# Patient Record
Sex: Female | Born: 1955 | Race: Black or African American | Hispanic: No | Smoking: Current every day smoker
Health system: Southern US, Community
[De-identification: ages and names within clinical notes are randomized; demographics above are authoritative.]

## PROBLEM LIST (undated history)

## (undated) DIAGNOSIS — D45 Polycythemia vera: Secondary | ICD-10-CM

## (undated) DIAGNOSIS — K219 Gastro-esophageal reflux disease without esophagitis: Secondary | ICD-10-CM

## (undated) DIAGNOSIS — Z720 Tobacco use: Secondary | ICD-10-CM

## (undated) DIAGNOSIS — I519 Heart disease, unspecified: Secondary | ICD-10-CM

## (undated) DIAGNOSIS — I509 Heart failure, unspecified: Secondary | ICD-10-CM

## (undated) DIAGNOSIS — J449 Chronic obstructive pulmonary disease, unspecified: Secondary | ICD-10-CM

## (undated) DIAGNOSIS — G4730 Sleep apnea, unspecified: Secondary | ICD-10-CM

## (undated) DIAGNOSIS — E119 Type 2 diabetes mellitus without complications: Secondary | ICD-10-CM

## (undated) DIAGNOSIS — I10 Essential (primary) hypertension: Secondary | ICD-10-CM

## (undated) DIAGNOSIS — M2550 Pain in unspecified joint: Secondary | ICD-10-CM

## (undated) HISTORY — DX: Polycythemia vera: D45

## (undated) HISTORY — DX: Essential (primary) hypertension: I10

## (undated) HISTORY — DX: Heart failure, unspecified: I50.9

## (undated) HISTORY — DX: Heart disease, unspecified: I51.9

## (undated) HISTORY — DX: Pain in unspecified joint: M25.50

---

## 2004-06-10 ENCOUNTER — Ambulatory Visit: Payer: Self-pay | Admitting: Family Medicine

## 2004-06-26 ENCOUNTER — Ambulatory Visit: Payer: Self-pay | Admitting: Family Medicine

## 2004-08-28 ENCOUNTER — Ambulatory Visit: Payer: Self-pay | Admitting: Unknown Physician Specialty

## 2008-02-02 ENCOUNTER — Ambulatory Visit: Payer: Self-pay | Admitting: Internal Medicine

## 2008-02-13 ENCOUNTER — Inpatient Hospital Stay: Payer: Self-pay | Admitting: Surgery

## 2008-06-04 ENCOUNTER — Ambulatory Visit: Payer: Self-pay | Admitting: Family Medicine

## 2009-05-16 ENCOUNTER — Ambulatory Visit: Payer: Self-pay | Admitting: Family Medicine

## 2009-06-05 ENCOUNTER — Ambulatory Visit: Payer: Self-pay | Admitting: Family Medicine

## 2010-06-09 ENCOUNTER — Ambulatory Visit: Payer: Self-pay | Admitting: Family Medicine

## 2011-10-30 ENCOUNTER — Inpatient Hospital Stay: Payer: Self-pay | Admitting: Internal Medicine

## 2011-10-30 LAB — CBC
HCT: 49 % — ABNORMAL HIGH (ref 35.0–47.0)
MCHC: 32.9 g/dL (ref 32.0–36.0)
MCV: 94 fL (ref 80–100)
RBC: 5.22 10*6/uL — ABNORMAL HIGH (ref 3.80–5.20)
RDW: 14.6 % — ABNORMAL HIGH (ref 11.5–14.5)

## 2011-10-30 LAB — URINALYSIS, COMPLETE
Bacteria: NONE SEEN
Bilirubin,UR: NEGATIVE
Glucose,UR: NEGATIVE mg/dL (ref 0–75)
Ketone: NEGATIVE
Specific Gravity: 1.001 (ref 1.003–1.030)
Squamous Epithelial: 1

## 2011-10-30 LAB — BASIC METABOLIC PANEL
Anion Gap: 9 (ref 7–16)
BUN: 7 mg/dL (ref 7–18)
Chloride: 108 mmol/L — ABNORMAL HIGH (ref 98–107)
Co2: 22 mmol/L (ref 21–32)
Creatinine: 0.81 mg/dL (ref 0.60–1.30)
EGFR (African American): >60
Osmolality: 275 (ref 275–301)

## 2011-10-30 LAB — CK TOTAL AND CKMB (NOT AT ARMC)
CK, Total: 122 U/L (ref 21–215)
CK-MB: 1.6 ng/mL (ref 0.5–3.6)

## 2011-10-31 LAB — TROPONIN I
Troponin-I: 0.04 ng/mL
Troponin-I: 0.04 ng/mL

## 2011-10-31 LAB — CBC WITH DIFFERENTIAL/PLATELET
Basophil #: 0.1 10*3/uL (ref 0.0–0.1)
Basophil %: 0.7 %
Eosinophil #: 0.1 10*3/uL (ref 0.0–0.7)
HGB: 15.6 g/dL (ref 12.0–16.0)
Lymphocyte #: 2.4 10*3/uL (ref 1.0–3.6)
Lymphocyte %: 22.4 %
MCH: 32.6 pg (ref 26.0–34.0)
MCV: 95 fL (ref 80–100)
Monocyte #: 0.8 x10 3/mm (ref 0.2–0.9)
Monocyte %: 7 %
Neutrophil #: 7.6 10*3/uL — ABNORMAL HIGH (ref 1.4–6.5)
RBC: 4.79 10*6/uL (ref 3.80–5.20)

## 2011-10-31 LAB — LIPID PANEL
Cholesterol: 141 mg/dL (ref 0–200)
HDL Cholesterol: 38 mg/dL — ABNORMAL LOW (ref 40–60)
Ldl Cholesterol, Calc: 71 mg/dL (ref 0–100)
Triglycerides: 162 mg/dL (ref 0–200)
VLDL Cholesterol, Calc: 32 mg/dL (ref 5–40)

## 2011-10-31 LAB — TSH: Thyroid Stimulating Horm: 0.839 u[IU]/mL

## 2011-11-01 LAB — CBC
HCT: 48.7 % — ABNORMAL HIGH (ref 35.0–47.0)
MCH: 32.1 pg (ref 26.0–34.0)
MCHC: 33.7 g/dL (ref 32.0–36.0)
Platelet: 273 10*3/uL (ref 150–440)
RDW: 15.2 % — ABNORMAL HIGH (ref 11.5–14.5)
WBC: 8.6 10*3/uL (ref 3.6–11.0)

## 2011-11-01 LAB — PROTIME-INR
INR: 1.1
Prothrombin Time: 14.2 secs (ref 11.5–14.7)

## 2011-11-01 LAB — BASIC METABOLIC PANEL
Anion Gap: 12 (ref 7–16)
BUN: 16 mg/dL (ref 7–18)
Chloride: 105 mmol/L (ref 98–107)
Co2: 24 mmol/L (ref 21–32)
Creatinine: 0.98 mg/dL (ref 0.60–1.30)
EGFR (African American): >60
EGFR (Non-African Amer.): >60
Osmolality: 283 (ref 275–301)

## 2011-11-05 LAB — CULTURE, BLOOD (SINGLE)

## 2011-12-01 ENCOUNTER — Ambulatory Visit: Payer: Self-pay

## 2012-04-27 ENCOUNTER — Ambulatory Visit: Payer: Self-pay | Admitting: Cardiovascular Disease

## 2012-06-25 ENCOUNTER — Emergency Department: Payer: Self-pay | Admitting: Emergency Medicine

## 2012-06-25 LAB — COMPREHENSIVE METABOLIC PANEL
Albumin: 3.3 g/dL — ABNORMAL LOW (ref 3.4–5.0)
Alkaline Phosphatase: 122 U/L (ref 50–136)
Anion Gap: 8 (ref 7–16)
BUN: 8 mg/dL (ref 7–18)
Bilirubin,Total: 0.4 mg/dL (ref 0.2–1.0)
Calcium, Total: 8.7 mg/dL (ref 8.5–10.1)
Chloride: 105 mmol/L (ref 98–107)
Co2: 24 mmol/L (ref 21–32)
Potassium: 3.5 mmol/L (ref 3.5–5.1)
SGOT(AST): 27 U/L (ref 15–37)
Sodium: 137 mmol/L (ref 136–145)

## 2012-06-25 LAB — CBC
HCT: 46.7 % (ref 35.0–47.0)
MCH: 32.4 pg (ref 26.0–34.0)
MCV: 93 fL (ref 80–100)
Platelet: 249 10*3/uL (ref 150–440)
RBC: 5.02 10*6/uL (ref 3.80–5.20)
RDW: 14.1 % (ref 11.5–14.5)

## 2012-06-26 LAB — TROPONIN I: Troponin-I: <0.02 ng/mL

## 2012-06-26 LAB — DIGOXIN LEVEL: Digoxin: 0.55 ng/mL

## 2012-06-26 LAB — CK TOTAL AND CKMB (NOT AT ARMC): CK, Total: 281 U/L — ABNORMAL HIGH (ref 21–215)

## 2012-07-06 ENCOUNTER — Ambulatory Visit: Payer: Self-pay | Admitting: Internal Medicine

## 2012-07-12 ENCOUNTER — Other Ambulatory Visit (HOSPITAL_COMMUNITY): Payer: Self-pay | Admitting: *Deleted

## 2012-07-12 DIAGNOSIS — I509 Heart failure, unspecified: Secondary | ICD-10-CM

## 2012-07-13 ENCOUNTER — Other Ambulatory Visit (HOSPITAL_COMMUNITY): Payer: Self-pay | Admitting: *Deleted

## 2012-07-24 ENCOUNTER — Ambulatory Visit (HOSPITAL_COMMUNITY)
Admission: RE | Admit: 2012-07-24 | Discharge: 2012-07-24 | Disposition: A | Discharge status: Home / Self Care | Payer: Medicaid Other | Source: Ambulatory Visit | Attending: Cardiovascular Disease | Admitting: Cardiovascular Disease

## 2012-07-24 DIAGNOSIS — I509 Heart failure, unspecified: Secondary | ICD-10-CM | POA: Insufficient documentation

## 2012-07-24 DIAGNOSIS — Z8249 Family history of ischemic heart disease and other diseases of the circulatory system: Secondary | ICD-10-CM | POA: Insufficient documentation

## 2012-07-24 NOTE — Progress Notes (Signed)
2D Echo Performed 07/24/2012    Sukhraj Esquivias, RCS  

## 2012-08-16 ENCOUNTER — Ambulatory Visit: Payer: Self-pay

## 2013-06-27 ENCOUNTER — Ambulatory Visit: Payer: Self-pay | Admitting: Internal Medicine

## 2013-07-16 ENCOUNTER — Ambulatory Visit: Payer: Self-pay

## 2013-11-27 LAB — TSH: TSH: 1.04 u[IU]/mL (ref 0.41–5.90)

## 2013-12-12 LAB — LIPID PANEL
Cholesterol: 198 mg/dL (ref 0–200)
HDL: 43 mg/dL (ref 35–70)
LDL CALC: 130 mg/dL
Triglycerides: 125 mg/dL (ref 40–160)

## 2014-05-21 NOTE — Discharge Summary (Signed)
PATIENT NAME:  Stacy Cox, Stacy Cox MR#:  [id number] DATE OF BIRTH:  1955/12/01  DATE OF ADMISSION:  10/30/2011 DATE OF DISCHARGE:  11/01/2011  DISCHARGE DIAGNOSES:  1. Acute on chronic systolic heart failure with cardiac catheterization showing moderate coronary artery disease in mid LAD and mid RCA with severe LV systolic dysfunction and ejection fraction of 18% on Echo and 28% on catheterization. 2. Acute bronchitis, ruled out pneumonia, improving on antibiotics.   SECONDARY DIAGNOSIS: History of diverticulosis.   CONSULTANTS: Neoma Laming, MD - Cardiology.  PROCEDURES/RADIOLOGY: Cardiac catheterization on 11/01/2011 showed severely depressed global LV systolic function. Moderate coronary artery disease in mid LAD and RCA with severe LV dysfunction with EF of 28% on catheterization and 18% on Echo. Aggressive medical management was recommended.   2-D echocardiogram on 10/31/2011 showed dilated left atrium and left ventricle with severe LV dysfunction with ejection fraction of 18%. Mild mitral and tricuspid regurgitation with mild pulmonary hypertension.   Chest x-ray on 10/30/2011 showed bilateral interstitial edema versus pneumonitis.   Urinalysis on admission was negative.   Blood cultures x2 were negative, on 10/30/2011.  HISTORY AND SHORT HOSPITAL COURSE: The patient is a 59 year old female with above-mentioned medical problems who was admitted for acute on chronic heart failure. There was also concern for possible pneumonia, which was ruled out, as the patient did not have anymore fever or leukocytosis. This was thought to be possible bronchitis. She had a negative blood culture. The patient's chest pain was resolved. Cardiology consultation was obtained with Dr. Neoma Laming who recommended 2-D echocardiogram and subsequently cardiac catheterization considering the patient's new onset of symptoms, which was performed. The patient's coronaries were clean enough not to get any intervention,  although the patient had severe LV dysfunction for which aggressive medical management was recommended by cardiology. The patient was stable enough to be discharged home on 11/01/2011.  DISCHARGE PHYSICAL EXAMINATION:  VITALS: On the date of discharge, temperature was 97.7, heart rate 62 per minute, respirations 20 per minute, blood pressure 133/63 mmHg, and she was saturating 98% on room air.   CARDIOVASCULAR: S1 and S2 normal. No murmur, rubs, or gallops.   PULMONARY: Lungs clear to auscultation bilaterally. No wheezing, rales, rhonchi, or crepitation.   ABDOMEN: Soft and benign.   NEUROLOGIC: Nonfocal examination. All other physical examination remained at baseline.   DISCHARGE MEDICATIONS:  1. Aspirin 81 mg p.o. daily.  2. Lasix 40 mg p.o. daily.  3. Potassium chloride 20 mEq p.o. daily.  4. Levaquin 500 mg p.o. daily for three days. 5. Spironolactone 25 mg half tablet p.o. daily.  6. Lisinopril 5 mg p.o. daily.  7. Metoprolol 25 mg p.o. at bedtime.  8. Digoxin 0.125 mg p.o. daily.   DISCHARGE DIET: Low sodium, low fat, low cholesterol.   DISCHARGE ACTIVITY: As tolerated.          DISCHARGE INSTRUCTIONS AND FOLLOW-UP: The patient was instructed to follow-up with her primary care physician at the [facility] in 1 to 2 weeks. She will need follow-up with Dr. Neoma Laming this coming Thursday, 11/04/2011 at 2:00 p.m. as scheduled.   TOTAL TIME DISCHARGING THIS PATIENT: 55 minutes.  ____________________________ Lucina Mellow. Manuella Ghazi, MD vss:slb D: 11/04/2011 14:42:07 ET T: 11/04/2011 17:19:25 ET JOB#: [id number]  cc: Xzavier Swinger S. Manuella Ghazi, MD, <Dictator> Bethena Roys. Ancil Boozer, MD Dionisio David, MD Lucina Mellow [facility] MD ELECTRONICALLY SIGNED 11/05/2011 15:28

## 2014-05-21 NOTE — Consult Note (Signed)
PATIENT NAME:  Stacy Cox, Stacy Cox MR#:  [id number] DATE OF BIRTH:  12/18/1955  DATE OF CONSULTATION:  10/31/2011  REFERRING PHYSICIAN:   CONSULTING PHYSICIAN:  Dionisio David, MD  INDICATION FOR CONSULTATION: Congestive heart failure.   HISTORY OF PRESENT ILLNESS: This is a 59 year old African American female with a history of diverticulosis who presented to the Emergency Room with shortness of breath and chest pain. She says last Wednesday and last Thursday she was working. She did not have any chest pain or shortness of breath. On Friday all of a sudden she became very short of breath and started having chest pain. When she came into the Emergency Room, her BNP was 4000. I was asked to evaluate the patient because of CHF. She is still having some shortness of breath, PND, orthopnea but also complaining of pressure in her chest.   PAST MEDICAL HISTORY: History of diverticulosis.   SOCIAL HISTORY: She smokes half pack per day for 30 years. No EtOH abuse or drug use.   FAMILY HISTORY: Positive for coronary artery disease. Her mother had a CABG recently.   PHYSICAL EXAMINATION:   GENERAL: She is alert, oriented x3, in no acute distress right now.  VITAL SIGNS: Pulse 98, respirations 16, afebrile, blood pressure 124/80, saturation 96.   NECK: Positive JVD.   LUNGS: Few crackles at the bases.   HEART: Regular rate and rhythm, tachycardic. There is a 2/6 systolic murmur at the mitral area.   ABDOMEN: Soft, nontender. Positive bowel sounds.   EXTREMITIES: Trace pedal edema.   NEUROLOGIC: She appears to be intact.  LABORATORY, DIAGNOSTIC, AND RADIOLOGICAL DATA: Cholesterol 141, LDL 71. Troponin 0.04. Hemoglobin and hematocrit and white count were normal. So far blood cultures are negative.   Her chest x-ray showed bilateral diffuse interstitial thickening likely representing interstitial edema versus interstitial pneumonitis. Her troponins were negative. BUN and creatinine was normal.    ASSESSMENT AND PLAN: The patient probably has CHF, etiology unclear. Will look at echocardiogram and evaluate etiology of CHF. Right now she continues to have chest pain. Have to further see what is the etiology of the chest pain, most likely is pleuritic but cardiac enzymes are unremarkable. Her EKG showed sinus tachycardia, nonspecific ST-T changes with PVCs. Will start beta-blockers or Coreg once certain the etiology of CHF, whether it's LV dysfunction versus diastolic dysfunction. She is anxious to go home but will have to decide how to proceed based on ejection fraction. Right now will increase her Lasix to 40 twice a day to diurese her further.   Thank you very much for the referral.   ____________________________ Dionisio David, MD sak:drc D: 10/31/2011 10:36:35 ET T: 10/31/2011 11:19:01 ET JOB#: [id number]  cc: Dionisio David, MD, <Dictator> Dionisio David MD ELECTRONICALLY SIGNED 11/08/2011 15:07

## 2014-05-21 NOTE — H&P (Signed)
PATIENT NAME:  Stacy Cox, Stacy Cox MR#:  [id number] DATE OF BIRTH:  1955/11/28  DATE OF ADMISSION:  10/30/2011  REFERRING PHYSICIAN: Pollie Friar, MD   PRIMARY CARE PHYSICIAN: Bethena Roys. Sowles, MD   CHIEF COMPLAINT: Cough, sputum, shortness of breath since yesterday.   HISTORY OF PRESENT ILLNESS: The patient is a 59 year old African American female with a history of diverticulosis, presented to the ED with the above chief complaint. The patient is alert, awake, and oriented. According to her, she started to have a cough, shortness of breath with sputum since yesterday. She also complains of chest pain which is substernal, dull, constant, no radiation. She said she has had orthopnea and nocturnal dyspnea for weeks. In addition, she has foot edema. Also, she has chills, but no fever. She denies any other symptoms. Her BNP was elevated more than 4000 in the Emergency Department.   PAST MEDICAL HISTORY: Diverticulosis.   SOCIAL HISTORY: She smokes 10 cigarettes a day for more than 30 years. No alcohol drinking or illicit drugs.   FAMILY HISTORY: Father had a heart attack. Her mother had coronary artery disease bypass graft, status post CABG. Sister has diabetes.   PAST SURGICAL HISTORY: None.   REVIEW OF SYSTEMS: CONSTITUTIONAL: The patient denies any fevers but had chills. No weakness or weight gain. EYES: No double vision, blurred vision. ENT: No postnasal drip, epistaxis, slurred speech, or dysphagia. CARDIOVASCULAR: Positive for chest pain, but no palpitations. Has orthopnea and nocturnal dyspnea and foot edema.   PULMONARY: Positive for shortness of breath, cough with sputum. No wheezing, no hemoptysis.   GASTROINTESTINAL: No abdominal pain, nausea, vomiting, or diarrhea. No melena or bloody stools.   GENITOURINARY: No dysuria, hematuria, or incontinence.   SKIN: No rash or jaundice.   NEUROLOGY: No syncope, loss of consciousness or seizure.   HEMATOLOGY: No easy bruising, bleeding.    MUSCULOSKELETAL: No joint pain. No edema.   ENDOCRINE: No heat or cold intolerance. No polyuria or polydipsia.   PHYSICAL EXAMINATION:  VITAL SIGNS: Temperature 98.4, blood pressure originally was 185/115 and decreased to 141/89, pulse 92, respirations 22, oxygen saturation 95% on room air.   GENERAL: The patient is alert, awake, oriented, in no acute distress.   HEENT: Pupils are round, equal, reactive to light and accommodation. Moist oral mucosa. Clear oropharynx.   NECK: Supple. No JVD or carotid bruit. No lymphadenopathy. No thyromegaly.   CARDIOVASCULAR: S1, S2 regular rate and rhythm. No murmurs or gallops.   PULMONARY: Bilateral air entry. No wheezing or rales. No use of accessory muscles to breathe.   ABDOMEN: Soft, obese. No distention. No tenderness. No organomegaly. Bowel sounds present.   EXTREMITIES: No edema, clubbing, or cyanosis. No calf tenderness. Strong bilateral pedal pulses.   NEUROLOGY: Alert and oriented x3. No focal deficit. Power 5 out of 5. Sensation intact.   SKIN: No rash or jaundice.   LABORATORY, DIAGNOSTIC AND RADIOLOGICAL DATA: WBC 14.9, hemoglobin 6.1, platelets 299. BNP 4485, CK 122, CK-MB 1.6. Electrolytes are normal. BUN 7, creatinine 0.81. Troponin less than 0.02. Urinalysis is negative. Chest x-ray showed bilateral diffuse interstitial thickening representing interstitial edema or interstitial pneumonitis. EKG showed sinus tachycardia with a PVC, possible left atrial enlargement, heart rate 103 beats per minute.   IMPRESSION:  1. New onset acute congestive heart failure.  2. Chest pain, need to rule out ACS or coronary artery disease.  3. Hypertension.  4. Leukocytosis.  5. Pneumonitis.  6. Tobacco abuse.   PLAN OF TREATMENT:  1. The  patient will be admitted to the telemetry floor. We will continue O2 by nasal cannula, start Lasix 20 mg IV every 12 hours. We will get echocardiograph and a Cardiology consult.  2. We will follow up  troponin level and give aspirin.  3. To cover pneumonia, we will give Levaquin and follow up CBC, sputum culture, blood culture.  4. Smoking cessation was counseled for 5 minutes.   I discussed the patient's situation and the plan of treatment with the patient and the patient's daughter.   TIME SPENT: About 50 minutes.  ____________________________ Demetrios Loll, MD qc:cbb D: 10/30/2011 22:45:51 ET T: 10/31/2011 08:50:41 ET JOB#: [id number]  cc: Demetrios Loll, MD, <Dictator> Bethena Roys. Ancil Boozer, MD Demetrios Loll MD ELECTRONICALLY SIGNED 11/01/2011 14:51

## 2014-06-20 ENCOUNTER — Encounter (INDEPENDENT_AMBULATORY_CARE_PROVIDER_SITE_OTHER): Payer: Self-pay

## 2014-07-18 ENCOUNTER — Other Ambulatory Visit: Payer: Self-pay

## 2014-07-18 LAB — CBC AND DIFFERENTIAL
Neutrophils Absolute: 8 /uL
WBC: 12 10^3/mL

## 2014-07-18 LAB — POCT ERYTHROCYTE SEDIMENTATION RATE, NON-AUTOMATED: SED RATE: 29 mm

## 2014-07-18 LAB — BASIC METABOLIC PANEL
BUN: 9 mg/dL (ref 4–21)
Creatinine: 0.9 mg/dL (ref 0.5–1.1)
GLUCOSE: 116 mg/dL
Sodium: 139 mmol/L (ref 137–147)

## 2014-07-18 LAB — HEPATIC FUNCTION PANEL: BILIRUBIN, TOTAL: 0.6 mg/dL

## 2014-07-24 ENCOUNTER — Other Ambulatory Visit: Payer: Self-pay

## 2014-07-31 ENCOUNTER — Ambulatory Visit: Payer: Self-pay | Admitting: Internal Medicine

## 2014-09-18 ENCOUNTER — Encounter (INDEPENDENT_AMBULATORY_CARE_PROVIDER_SITE_OTHER): Payer: Self-pay

## 2014-11-16 DIAGNOSIS — K219 Gastro-esophageal reflux disease without esophagitis: Secondary | ICD-10-CM | POA: Insufficient documentation

## 2014-11-22 ENCOUNTER — Ambulatory Visit: Payer: Self-pay | Admitting: Family Medicine

## 2014-12-13 ENCOUNTER — Inpatient Hospital Stay: Payer: Medicare HMO

## 2014-12-13 ENCOUNTER — Inpatient Hospital Stay: Payer: Medicare HMO | Attending: Internal Medicine | Admitting: Internal Medicine

## 2014-12-13 ENCOUNTER — Encounter: Payer: Self-pay | Admitting: Internal Medicine

## 2014-12-13 VITALS — BP 139/79 | HR 76 | Temp 97.8°F | Resp 22 | Ht 69.0 in

## 2014-12-13 DIAGNOSIS — M549 Dorsalgia, unspecified: Secondary | ICD-10-CM | POA: Diagnosis not present

## 2014-12-13 DIAGNOSIS — I509 Heart failure, unspecified: Secondary | ICD-10-CM | POA: Diagnosis not present

## 2014-12-13 DIAGNOSIS — F17210 Nicotine dependence, cigarettes, uncomplicated: Secondary | ICD-10-CM | POA: Diagnosis not present

## 2014-12-13 DIAGNOSIS — Z23 Encounter for immunization: Secondary | ICD-10-CM | POA: Insufficient documentation

## 2014-12-13 DIAGNOSIS — Z7982 Long term (current) use of aspirin: Secondary | ICD-10-CM | POA: Diagnosis not present

## 2014-12-13 DIAGNOSIS — Z79899 Other long term (current) drug therapy: Secondary | ICD-10-CM | POA: Diagnosis not present

## 2014-12-13 DIAGNOSIS — D751 Secondary polycythemia: Secondary | ICD-10-CM | POA: Insufficient documentation

## 2014-12-13 DIAGNOSIS — D72829 Elevated white blood cell count, unspecified: Secondary | ICD-10-CM | POA: Insufficient documentation

## 2014-12-13 DIAGNOSIS — D45 Polycythemia vera: Secondary | ICD-10-CM | POA: Insufficient documentation

## 2014-12-13 DIAGNOSIS — M7989 Other specified soft tissue disorders: Secondary | ICD-10-CM

## 2014-12-13 DIAGNOSIS — D729 Disorder of white blood cells, unspecified: Secondary | ICD-10-CM

## 2014-12-13 LAB — CBC WITH DIFFERENTIAL/PLATELET
BASOS PCT: 0 %
Basophils Absolute: 0 10*3/uL (ref 0–0.1)
EOS ABS: 0.1 10*3/uL (ref 0–0.7)
EOS PCT: 1 %
HCT: 51.8 % — ABNORMAL HIGH (ref 35.0–47.0)
Hemoglobin: 17.4 g/dL — ABNORMAL HIGH (ref 12.0–16.0)
LYMPHS ABS: 3.1 10*3/uL (ref 1.0–3.6)
Lymphocytes Relative: 25 %
MCH: 31.7 pg (ref 26.0–34.0)
MCHC: 33.6 g/dL (ref 32.0–36.0)
MCV: 94.5 fL (ref 80.0–100.0)
MONOS PCT: 7 %
Monocytes Absolute: 0.8 10*3/uL (ref 0.2–0.9)
NEUTROS PCT: 67 %
Neutro Abs: 8.6 10*3/uL — ABNORMAL HIGH (ref 1.4–6.5)
PLATELETS: 261 10*3/uL (ref 150–440)
RBC: 5.48 MIL/uL — AB (ref 3.80–5.20)
RDW: 15 % — ABNORMAL HIGH (ref 11.5–14.5)
WBC: 12.6 10*3/uL — AB (ref 3.6–11.0)

## 2014-12-13 LAB — COMPREHENSIVE METABOLIC PANEL
ALBUMIN: 4.1 g/dL (ref 3.5–5.0)
ALT: 23 U/L (ref 14–54)
ANION GAP: 6 (ref 5–15)
AST: 21 U/L (ref 15–41)
Alkaline Phosphatase: 98 U/L (ref 38–126)
BUN: 9 mg/dL (ref 6–20)
CHLORIDE: 104 mmol/L (ref 101–111)
CO2: 27 mmol/L (ref 22–32)
Calcium: 9.2 mg/dL (ref 8.9–10.3)
Creatinine, Ser: 0.88 mg/dL (ref 0.44–1.00)
GFR calc non Af Amer: >60 mL/min (ref >60)
GLUCOSE: 94 mg/dL (ref 65–99)
Potassium: 3.9 mmol/L (ref 3.5–5.1)
SODIUM: 137 mmol/L (ref 135–145)
Total Bilirubin: 0.7 mg/dL (ref 0.3–1.2)
Total Protein: 8.1 g/dL (ref 6.5–8.1)

## 2014-12-13 LAB — LACTATE DEHYDROGENASE: LDH: 149 U/L (ref 98–192)

## 2014-12-13 MED ORDER — INFLUENZA VAC SPLIT QUAD 0.5 ML IM SUSY
PREFILLED_SYRINGE | INTRAMUSCULAR | Status: AC
  Filled 2014-12-13: qty 0.5

## 2014-12-13 MED ORDER — INFLUENZA VAC SPLIT QUAD 0.5 ML IM SUSY
0.5000 mL | PREFILLED_SYRINGE | Freq: Once | INTRAMUSCULAR | Status: AC
  Administered 2014-12-13: 0.5 mL via INTRAMUSCULAR

## 2014-12-13 NOTE — Progress Notes (Signed)
[place] CONSULT NOTE  Patient Care Team: No Pcp Per Patient as PCP - General (General Practice)  CHIEF COMPLAINTS/PURPOSE OF CONSULTATION:  Polycthemia  # OCT 2016- POLYCYTHEMIA- leucocytosis/ 12.3/mild neutrophilia; Hb-16.4/hct- 110  # SMOKING-   # Compensated CHF [2013;Dr.Khan];   HISTORY OF PRESENTING ILLNESS:   Stacy Cox 59 y.o.  female with long-standing history of smoking; and history of congestive heart failure currently compensated- was noted to have mild neutrophilia/erythrocytosis as noted above. She is here for further evaluation.  Patient denies any frequent infections. Patient denies being on steroids. She smokes. She does mention of snoring. She never had a sleep study. Denies any unusual weight loss or night sweats or new lumps or bumps.  She has mild chronic swelling in the legs-this is not any worse. She has chronic back pain-again not any worse. She has noted intermittent pain in the right anterior lower ribs.  ROS: A complete 10 point review of system is done which is negative except mentioned above in history of present illness  MEDICAL HISTORY:  Past Medical History  Diagnosis Date  . Polycythemia vera ([place])   . CHF (congestive heart failure) ([facility])     SURGICAL HISTORY: No past surgical history on file.  SOCIAL HISTORY: Social History   Social History  . Marital Status: Divorced    Spouse Name: N/A  . Number of Children: N/A  . Years of Education: N/A   Occupational History  . Not on file.   Social History Main Topics  . Smoking status: Current Every Day Smoker -- 0.25 packs/day for 40 years    Types: Cigarettes  . Smokeless tobacco: Never Used     Comment: smoking since teens.  . Alcohol Use: No  . Drug Use: No  . Sexual Activity: Not on file   Other Topics Concern  . Not on file   Social History Narrative  . No narrative on file    FAMILY HISTORY: No family history on file.  ALLERGIES:  has No Known  Allergies.  MEDICATIONS:  Current Outpatient Prescriptions  Medication Sig Dispense Refill  . aspirin 325 MG EC tablet Take 325 mg by mouth daily.    Marland Kitchen COREG CR 80 MG 24 hr capsule Take 1 tablet by mouth daily.    . furosemide (LASIX) 40 MG tablet Take 1 tablet by mouth daily.    Marland Kitchen ibuprofen (ADVIL,MOTRIN) 600 MG tablet Take 1 tablet by mouth daily as needed. pain    . losartan (COZAAR) 25 MG tablet Take 1 tablet by mouth daily.    . meloxicam (MOBIC) 7.5 MG tablet Take 1 tablet by mouth daily.    . metFORMIN (GLUCOPHAGE) 500 MG tablet Take 1 tablet by mouth daily with breakfast.    . pantoprazole (PROTONIX) 40 MG tablet Take 1 tablet by mouth daily.    . potassium chloride SA (K-DUR,KLOR-CON) 20 MEQ tablet Take 1 tablet by mouth daily.    . rosuvastatin (CRESTOR) 10 MG tablet Take 1 tablet by mouth daily.    Marland Kitchen spironolactone (ALDACTONE) 25 MG tablet Take 1 tablet by mouth daily.     No current facility-administered medications for this visit.      Marland Kitchen  PHYSICAL EXAMINATION:  There were no vitals filed for this visit. There were no vitals filed for this visit.  GENERAL: Well-nourished well-developed; Alert, no distress and comfortable. Alone. Obese. EYES: no pallor or icterus OROPHARYNX: no thrush or ulceration; good dentition  NECK: supple, no masses felt LYMPH:  no palpable lymphadenopathy in the cervical, axillary or inguinal regions LUNGS: clear to auscultation and  No wheeze or crackles HEART/CVS: regular rate & rhythm and no murmurs; No lower extremity edema ABDOMEN: abdomen soft, non-tender and normal bowel sounds Musculoskeletal:no cyanosis of digits and no clubbing  PSYCH: alert & oriented x 3 with fluent speech NEURO: no focal motor/sensory deficits SKIN:  no rashes or significant lesions  LABORATORY DATA:  I have reviewed the data as listed Lab Results  Component Value Date   WBC 14.1* 06/25/2012   HGB 16.3* 06/25/2012   HCT 46.7 06/25/2012   MCV 93 06/25/2012    PLT 249 06/25/2012   No results for input(s): NA, K, CL, CO2, GLUCOSE, BUN, CREATININE, CALCIUM, GFRNONAA, GFRAA, PROT, ALBUMIN, AST, ALT, ALKPHOS, BILITOT, BILIDIR, IBILI in the last 8760 hours.  RADIOGRAPHIC STUDIES: I have personally reviewed the radiological images as listed and agreed with the findings in the report. No results found.  ASSESSMENT & PLAN:   # Mild erythrocytosis/mild neutrophilia- likely secondary/smoking. Rule out other causes like- primary polycythemia/CML. Check CBC CMP and LDH jack 2 mutation BCR ABL by fish.  erythropoietin levels; carbon monoxide levels.  # Recommended quitting smoking.  # Patient will need screening mammograms/.   # Recommend follow-up in approximately 2 weeks- review the labs/Plan of care.  Thank you Dr. Clide Deutscher for allowing me to participate in the care of your pleasant patient. Please do not hesitate to contact me with questions or concerns in the interim.  All questions were answered. The patient knows to call the clinic with any problems, questions or concerns.  I spent 30 minutes counseling the patient face to face. The total time spent in the appointment was 40 minutes and more than 50% was on counseling.     Cammie Sickle, MD 12/13/2014 12:22 PM

## 2014-12-16 ENCOUNTER — Encounter: Payer: Self-pay | Admitting: Internal Medicine

## 2014-12-16 LAB — CARBON MONOXIDE, BLOOD (PERFORMED AT REF LAB): CARBON MONOXIDE, BLOOD: 8.2 % — AB (ref 0.0–1.9)

## 2014-12-25 LAB — JAK2 EXON 12 MUTATION ANALYSIS

## 2014-12-25 LAB — JAK2  V617F QUAL. WITH REFLEX TO EXON 12

## 2014-12-25 LAB — BCR-ABL1 FISH
CELLS COUNTED: 200
Cells Analyzed: 200

## 2014-12-25 LAB — ERYTHROPOIETIN: Erythropoietin: 15.3 m[IU]/mL (ref 2.6–18.5)

## 2015-01-02 ENCOUNTER — Encounter: Payer: Self-pay | Admitting: Internal Medicine

## 2015-01-02 ENCOUNTER — Inpatient Hospital Stay: Payer: Medicare HMO

## 2015-01-02 ENCOUNTER — Inpatient Hospital Stay: Payer: Medicare HMO | Attending: Internal Medicine | Admitting: Internal Medicine

## 2015-01-02 VITALS — BP 142/88 | HR 73 | Temp 98.0°F | Resp 18 | Ht 69.0 in | Wt 253.5 lb

## 2015-01-02 VITALS — BP 117/76 | HR 74 | Resp 18

## 2015-01-02 DIAGNOSIS — D45 Polycythemia vera: Secondary | ICD-10-CM

## 2015-01-02 DIAGNOSIS — Z803 Family history of malignant neoplasm of breast: Secondary | ICD-10-CM | POA: Insufficient documentation

## 2015-01-02 DIAGNOSIS — Z79899 Other long term (current) drug therapy: Secondary | ICD-10-CM | POA: Diagnosis not present

## 2015-01-02 DIAGNOSIS — I509 Heart failure, unspecified: Secondary | ICD-10-CM | POA: Insufficient documentation

## 2015-01-02 DIAGNOSIS — E119 Type 2 diabetes mellitus without complications: Secondary | ICD-10-CM | POA: Diagnosis not present

## 2015-01-02 DIAGNOSIS — Z8051 Family history of malignant neoplasm of kidney: Secondary | ICD-10-CM | POA: Diagnosis not present

## 2015-01-02 DIAGNOSIS — Z7982 Long term (current) use of aspirin: Secondary | ICD-10-CM | POA: Diagnosis not present

## 2015-01-02 DIAGNOSIS — D751 Secondary polycythemia: Secondary | ICD-10-CM | POA: Insufficient documentation

## 2015-01-02 DIAGNOSIS — M2550 Pain in unspecified joint: Secondary | ICD-10-CM | POA: Insufficient documentation

## 2015-01-02 DIAGNOSIS — F17210 Nicotine dependence, cigarettes, uncomplicated: Secondary | ICD-10-CM | POA: Diagnosis not present

## 2015-01-02 DIAGNOSIS — Z7984 Long term (current) use of oral hypoglycemic drugs: Secondary | ICD-10-CM | POA: Insufficient documentation

## 2015-01-02 DIAGNOSIS — D72829 Elevated white blood cell count, unspecified: Secondary | ICD-10-CM | POA: Diagnosis not present

## 2015-01-02 NOTE — Progress Notes (Signed)
[place] CONSULT NOTE  Patient Care Team: No Pcp Per Patient as PCP - General (General Practice)  CHIEF COMPLAINTS/PURPOSE OF CONSULTATION:  Polycthemia  # OCT 2016- POLYCYTHEMIA- leucocytosis/ 12.3/mild neutrophilia; Hb-16.4/hct- 49;CO- 8.2% [smoking]  BCR ABL by fish-NEG; JAK-2 & exon 12- NEG  # SMOKING- recom quitting  # Compensated CHF [2013;Dr.Khan];   HISTORY OF PRESENTING ILLNESS:   Stacy Cox 59 y.o.  female with long-standing history of smoking; and history of congestive heart failure currently compensated -is here to review the results of her workup for her mild neutrophilia/polycythemia.  She denies any new symptoms; denies any unusual shortness of breath or chest pain.  ROS: A complete 10 point review of system is done which is negative except mentioned above in history of present illness  MEDICAL HISTORY:  Past Medical History  Diagnosis Date  . Polycythemia vera ([place])   . CHF (congestive heart failure) ([place])   . Joint pain   . Diabetes ([place])   . Heart disease     SURGICAL HISTORY: No past surgical history on file.  SOCIAL HISTORY: Social History   Social History  . Marital Status: Divorced    Spouse Name: N/A  . Number of Children: N/A  . Years of Education: N/A   Occupational History  . Not on file.   Social History Main Topics  . Smoking status: Current Every Day Smoker -- 0.25 packs/day for 40 years    Types: Cigarettes  . Smokeless tobacco: Never Used     Comment: smoking since teens  . Alcohol Use: No  . Drug Use: No  . Sexual Activity: Not Currently   Other Topics Concern  . Not on file   Social History Narrative    FAMILY HISTORY: Family History  Problem Relation Age of Onset  . Breast cancer Sister     sister #1 age 39  . Kidney cancer Sister     sister #1 age -63's  . Breast cancer Sister     sister #2 age 69's  . Kidney disease Sister     sister #2 age 82's  . Diabetes Mother   . Diabetes Sister   .  Heart disease Mother     + BYPASS SURGERY  . Heart disease Sister   . Hypertension Brother   . Deep vein thrombosis Mother   . Arthritis Mother     ALLERGIES:  has No Known Allergies.  MEDICATIONS:  Current Outpatient Prescriptions  Medication Sig Dispense Refill  . aspirin 325 MG EC tablet Take 325 mg by mouth daily.    Marland Kitchen b complex vitamins tablet Take 1 tablet by mouth daily.    Marland Kitchen COREG CR 80 MG 24 hr capsule Take 1 tablet by mouth daily.    . furosemide (LASIX) 40 MG tablet Take 1 tablet by mouth daily.    Marland Kitchen losartan (COZAAR) 25 MG tablet Take 1 tablet by mouth daily.    . meloxicam (MOBIC) 7.5 MG tablet Take 1 tablet by mouth daily.    . metFORMIN (GLUCOPHAGE) 500 MG tablet Take 1 tablet by mouth daily with breakfast.    . Multiple Vitamins-Minerals (MULTIVITAL PO) Take 1 tablet by mouth daily.    . pantoprazole (PROTONIX) 40 MG tablet Take 1 tablet by mouth daily.    . potassium chloride SA (K-DUR,KLOR-CON) 20 MEQ tablet Take 1 tablet by mouth daily.    . rosuvastatin (CRESTOR) 10 MG tablet Take 1 tablet by mouth daily.    Marland Kitchen spironolactone (  ALDACTONE) 25 MG tablet Take 1 tablet by mouth daily.     No current facility-administered medications for this visit.      Marland Kitchen  PHYSICAL EXAMINATION:  Filed Vitals:   01/02/15 1446  BP: 142/88  Pulse: 73  Temp: 98 F (36.7 C)  Resp: 18   Filed Weights   01/02/15 1446  Weight: 253 lb 8.5 oz (115 kg)    GENERAL: Well-nourished well-developed; Alert, no distress and comfortable. Alone. Obese.   LABORATORY DATA:  I have reviewed the data as listed Lab Results  Component Value Date   WBC 12.6* 12/13/2014   HGB 17.4* 12/13/2014   HCT 51.8* 12/13/2014   MCV 94.5 12/13/2014   PLT 261 12/13/2014    Recent Labs  12/13/14 1257  NA 137  K 3.9  CL 104  CO2 27  GLUCOSE 94  BUN 9  CREATININE 0.88  CALCIUM 9.2  GFRNONAA >60  GFRAA >60  PROT 8.1  ALBUMIN 4.1  AST 21  ALT 23  ALKPHOS 98  BILITOT 0.7     RADIOGRAPHIC STUDIES: I have personally reviewed the radiological images as listed and agreed with the findings in the report. No results found.  ASSESSMENT & PLAN:   # Mild erythrocytosis/mild neutrophilia- likely secondary/smoking. Patient's workup including BCR ABL by fish-negative; jak 2 mutation negative. Given her history of CHF; recommend phlebotomy to keep hematocrit less than 50. I recommend phlebotomy of 3 50 mL blood.   # Long discussion regarding quitting smoking; patient interested but she feels that she cannot quit altogether at one time. Defer to her PCP regarding smoking cessation aids. Also encouraged the patient to call her PCP for further instructions.  #  Discussed the patient that she might be candidate for lung cancer screening-  Patient is not interested.  15 minutes face-to-face with the patient; more than 50% of time spent on counseling and coordination.       Cammie Sickle, MD 01/02/2015 3:09 PM

## 2015-02-02 HISTORY — PX: COLONOSCOPY: SHX174

## 2015-02-12 ENCOUNTER — Other Ambulatory Visit: Payer: Self-pay

## 2015-02-19 ENCOUNTER — Ambulatory Visit: Payer: Self-pay | Admitting: Internal Medicine

## 2015-05-02 ENCOUNTER — Emergency Department: Payer: Medicare HMO

## 2015-05-02 ENCOUNTER — Encounter: Payer: Self-pay | Admitting: Emergency Medicine

## 2015-05-02 ENCOUNTER — Inpatient Hospital Stay
Admission: EM | Admit: 2015-05-02 | Discharge: 2015-05-03 | DRG: 872 | Disposition: A | Discharge status: Home / Self Care | Payer: Medicare HMO | Attending: Internal Medicine | Admitting: Internal Medicine

## 2015-05-02 DIAGNOSIS — E119 Type 2 diabetes mellitus without complications: Secondary | ICD-10-CM

## 2015-05-02 DIAGNOSIS — Z7984 Long term (current) use of oral hypoglycemic drugs: Secondary | ICD-10-CM | POA: Diagnosis not present

## 2015-05-02 DIAGNOSIS — J449 Chronic obstructive pulmonary disease, unspecified: Secondary | ICD-10-CM | POA: Diagnosis present

## 2015-05-02 DIAGNOSIS — K5792 Diverticulitis of intestine, part unspecified, without perforation or abscess without bleeding: Secondary | ICD-10-CM | POA: Diagnosis present

## 2015-05-02 DIAGNOSIS — K219 Gastro-esophageal reflux disease without esophagitis: Secondary | ICD-10-CM | POA: Diagnosis present

## 2015-05-02 DIAGNOSIS — A419 Sepsis, unspecified organism: Secondary | ICD-10-CM | POA: Diagnosis present

## 2015-05-02 DIAGNOSIS — Z7982 Long term (current) use of aspirin: Secondary | ICD-10-CM

## 2015-05-02 DIAGNOSIS — I509 Heart failure, unspecified: Secondary | ICD-10-CM | POA: Diagnosis present

## 2015-05-02 DIAGNOSIS — Z66 Do not resuscitate: Secondary | ICD-10-CM | POA: Diagnosis present

## 2015-05-02 DIAGNOSIS — Z79899 Other long term (current) drug therapy: Secondary | ICD-10-CM | POA: Diagnosis not present

## 2015-05-02 DIAGNOSIS — K5732 Diverticulitis of large intestine without perforation or abscess without bleeding: Secondary | ICD-10-CM | POA: Diagnosis not present

## 2015-05-02 DIAGNOSIS — F17210 Nicotine dependence, cigarettes, uncomplicated: Secondary | ICD-10-CM | POA: Diagnosis present

## 2015-05-02 DIAGNOSIS — D751 Secondary polycythemia: Secondary | ICD-10-CM | POA: Diagnosis present

## 2015-05-02 DIAGNOSIS — Z720 Tobacco use: Secondary | ICD-10-CM | POA: Diagnosis present

## 2015-05-02 HISTORY — DX: Gastro-esophageal reflux disease without esophagitis: K21.9

## 2015-05-02 HISTORY — DX: Chronic obstructive pulmonary disease, unspecified: J44.9

## 2015-05-02 HISTORY — DX: Tobacco use: Z72.0

## 2015-05-02 HISTORY — DX: Type 2 diabetes mellitus without complications: E11.9

## 2015-05-02 LAB — COMPREHENSIVE METABOLIC PANEL
ALBUMIN: 4 g/dL (ref 3.5–5.0)
ALK PHOS: 108 U/L (ref 38–126)
ALT: 21 U/L (ref 14–54)
ANION GAP: 7 (ref 5–15)
AST: 25 U/L (ref 15–41)
BUN: 11 mg/dL (ref 6–20)
CHLORIDE: 106 mmol/L (ref 101–111)
CO2: 22 mmol/L (ref 22–32)
Calcium: 8.9 mg/dL (ref 8.9–10.3)
Creatinine, Ser: 0.8 mg/dL (ref 0.44–1.00)
GFR calc non Af Amer: >60 mL/min (ref >60)
GLUCOSE: 112 mg/dL — AB (ref 65–99)
Potassium: 3.6 mmol/L (ref 3.5–5.1)
SODIUM: 135 mmol/L (ref 135–145)
Total Bilirubin: 0.6 mg/dL (ref 0.3–1.2)
Total Protein: 7.9 g/dL (ref 6.5–8.1)

## 2015-05-02 LAB — URINALYSIS COMPLETE WITH MICROSCOPIC (ARMC ONLY)
BACTERIA UA: NONE SEEN
Bilirubin Urine: NEGATIVE
Glucose, UA: NEGATIVE mg/dL
Ketones, ur: NEGATIVE mg/dL
Leukocytes, UA: NEGATIVE
NITRITE: NEGATIVE
PH: 7 (ref 5.0–8.0)
PROTEIN: NEGATIVE mg/dL
SPECIFIC GRAVITY, URINE: 1.005 (ref 1.005–1.030)

## 2015-05-02 LAB — CBC
HEMATOCRIT: 49.6 % — AB (ref 35.0–47.0)
HEMOGLOBIN: 16.8 g/dL — AB (ref 12.0–16.0)
MCH: 31.9 pg (ref 26.0–34.0)
MCHC: 33.9 g/dL (ref 32.0–36.0)
MCV: 94.3 fL (ref 80.0–100.0)
Platelets: 234 10*3/uL (ref 150–440)
RBC: 5.26 MIL/uL — AB (ref 3.80–5.20)
RDW: 14.6 % — ABNORMAL HIGH (ref 11.5–14.5)
WBC: 18.3 10*3/uL — ABNORMAL HIGH (ref 3.6–11.0)

## 2015-05-02 LAB — LIPASE, BLOOD: LIPASE: 17 U/L (ref 11–51)

## 2015-05-02 LAB — TROPONIN I

## 2015-05-02 MED ORDER — CIPROFLOXACIN IN D5W 400 MG/200ML IV SOLN
400.0000 mg | Freq: Once | INTRAVENOUS | Status: AC
Start: 2015-05-02 — End: 2015-05-02
  Administered 2015-05-02: 400 mg via INTRAVENOUS
  Filled 2015-05-02 (×2): qty 200

## 2015-05-02 MED ORDER — METRONIDAZOLE IN NACL 5-0.79 MG/ML-% IV SOLN
500.0000 mg | Freq: Once | INTRAVENOUS | Status: AC
  Administered 2015-05-03: 500 mg via INTRAVENOUS
  Filled 2015-05-02: qty 100

## 2015-05-02 MED ORDER — IOPAMIDOL (ISOVUE-370) INJECTION 76%
100.0000 mL | Freq: Once | INTRAVENOUS | Status: AC | PRN
  Administered 2015-05-02: 100 mL via INTRAVENOUS
  Filled 2015-05-02: qty 100

## 2015-05-02 MED ORDER — DIATRIZOATE MEGLUMINE & SODIUM 66-10 % PO SOLN
15.0000 mL | Freq: Once | ORAL | Status: AC
  Administered 2015-05-02: 15 mL via ORAL
  Filled 2015-05-02: qty 30

## 2015-05-02 NOTE — ED Notes (Signed)
C/o LLQ abdominal pain for 2-3 days.  Denies vomiting/diarrhea but has had some nausea.  Has also had chills. Denies urinary sx.  Has also had SHOB mostly with exertion.  C/o fatigue

## 2015-05-02 NOTE — H&P (Signed)
[facility] NAME: Stacy Cox    MR#:  [id number]  DATE OF BIRTH:  Sep 21, 1955  DATE OF ADMISSION:  05/02/2015  PRIMARY CARE PHYSICIAN: Donnie Coffin, MD   REQUESTING/REFERRING PHYSICIAN: Dr Corky Downs  CHIEF COMPLAINT:   Chief Complaint  Patient presents with  . Abdominal Pain    HISTORY OF PRESENT ILLNESS: Stacy Cox  is a 60 y.o. female with a known history of Polycythemia, CHF, tobacco abuse, diabetes, GERD and COPD. She presents to the ED today due to a 2 day worsening of her intermittent abdominal pain. Patient has a baseline intermittent abdominal pain for the last 3 months and she attributes it to her diverticulosis. However, she woke up yesterday morning with an acute worsening of the pain. Pain is located in the left lower quadrant and does not radiate. Pain used to be intermittent but has become constant in the last 2 days. At the worst, pain is up to 5/10 and patient describes it as crampy/squeezing. She denies any changes with her bowel or urine habits except for her urine becoming concentrated today due to low oral intake. She however denies nausea or vomiting. She denies any dizziness, diaphoresis, chest pain, shortness of breath but has a chronic cough due to COPD. She reports a mild fever today and she also had chills on several occasions. She is compliant with her medications especially her CHF medications and takes metformin for diabetes. She smokes 15 cigarettes per day and does not have any plans to quit at this time. Patient has never had a colonoscopy.   On arrival in the ED, a shunt vitals were stable except for a pulse of 115. Temperature was 99.3. CBC was significant for a white count of 18,000 and CMP was normal. Urinalysis was unremarkable and troponin and lipase were negative. Chest x-ray only showed hyperinflated lungs consistent with COPD and EKG showed sinus tachycardia at 116 with PVCs and left atrial  enlargement. CT of the abdomen shows acute diverticulitis in the proximal sigmoid colon with focal thickening and soft tissue inflammation. There is no abscess or perforation.   Patient was given ciprofloxacin and metronidazole in the ED and she will be admitted due to acute diverticulitis/sepsis. She wishes to be DO NOT INTUBATE DO NOT RESUSCITATE  PAST MEDICAL HISTORY:   Past Medical History  Diagnosis Date  . Polycythemia vera ([place])   . CHF (congestive heart failure) ([place])   . Joint pain   . Heart disease   . Tobacco abuse   . GERD (gastroesophageal reflux disease)   . Diabetes mellitus, type 2 ([place])   . COPD (chronic obstructive pulmonary disease) ([place])     PAST SURGICAL HISTORY: History reviewed. No pertinent past surgical history.  SOCIAL HISTORY:  Social History  Substance Use Topics  . Smoking status: Current Every Day Smoker -- 0.25 packs/day for 40 years    Types: Cigarettes  . Smokeless tobacco: Never Used     Comment: smoking since teens  . Alcohol Use: No    FAMILY HISTORY:  Family History  Problem Relation Age of Onset  . Breast cancer Sister     sister #1 age 45  . Kidney cancer Sister     sister #1 age -51's  . Breast cancer Sister     sister #2 age 40's  . Kidney disease Sister     sister #2 age 34's  . Diabetes Mother   . Diabetes Sister   .  Heart disease Mother     + BYPASS SURGERY  . Heart disease Sister   . Hypertension Brother   . Deep vein thrombosis Mother   . Arthritis Mother     DRUG ALLERGIES: No Known Allergies  REVIEW OF SYSTEMS:   CONSTITUTIONAL: + fever, no fatigue or weakness.  EYES: No blurred or double vision.  EARS, NOSE, AND THROAT: No tinnitus or ear pain.  RESPIRATORY: No cough, shortness of breath, wheezing or hemoptysis.  CARDIOVASCULAR: No chest pain, orthopnea, edema.  GASTROINTESTINAL: As in history of present illness  GENITOURINARY: As in history of present illness ENDOCRINE: No polyuria, nocturia,  HEMATOLOGY:  No anemia, easy bruising or bleeding. + History of polycythemia SKIN: No rash or lesion. MUSCULOSKELETAL: No joint pain or arthritis.   NEUROLOGIC: No tingling, numbness, weakness.  PSYCHIATRY: No anxiety or depression.   MEDICATIONS AT HOME:  Prior to Admission medications   Medication Sig Start Date End Date Taking? Authorizing Provider  aspirin 325 MG EC tablet Take 325 mg by mouth daily.   Yes Historical Provider, MD  b complex vitamins tablet Take 1 tablet by mouth daily.   Yes Historical Provider, MD  COREG CR 80 MG 24 hr capsule Take 1 tablet by mouth daily. 11/20/14  Yes Historical Provider, MD  furosemide (LASIX) 40 MG tablet Take 1 tablet by mouth daily. 11/19/14  Yes Historical Provider, MD  losartan (COZAAR) 25 MG tablet Take 1 tablet by mouth daily. 12/03/14  Yes Historical Provider, MD  meloxicam (MOBIC) 7.5 MG tablet Take 1 tablet by mouth daily. 11/19/14  Yes Historical Provider, MD  metFORMIN (GLUCOPHAGE) 500 MG tablet Take 1 tablet by mouth daily with breakfast. 11/19/14  Yes Historical Provider, MD  Multiple Vitamins-Minerals (MULTIVITAL PO) Take 1 tablet by mouth daily.   Yes Historical Provider, MD  pantoprazole (PROTONIX) 40 MG tablet Take 1 tablet by mouth daily. 11/19/14  Yes Historical Provider, MD  potassium chloride SA (K-DUR,KLOR-CON) 20 MEQ tablet Take 1 tablet by mouth daily. 11/20/14  Yes Historical Provider, MD  spironolactone (ALDACTONE) 25 MG tablet Take 1 tablet by mouth daily. 11/19/14  Yes Historical Provider, MD      PHYSICAL EXAMINATION:   VITAL SIGNS: Blood pressure 108/61, pulse 98, temperature 99.3 F (37.4 C), temperature source Oral, resp. rate 19, height 5\' 8"  (1.727 m), weight 117.028 kg (258 lb), SpO2 96 %.  GENERAL:  60 y.o.-year-old patient lying in the bed with no acute distress. Alert and oriented x 3. EYES: Pupils equal, round, reactive to light and accommodation. No scleral icterus. Extraocular muscles intact.  HEENT: Head atraumatic,  normocephalic. Oropharynx and nasopharynx clear.  NECK:  Supple, no jugular venous distention. No thyroid enlargement, no tenderness.  LUNGS: Normal breath sounds bilaterally, no wheezing, rales,rhonchi or crepitation. No use of accessory muscles of respiration.  CARDIOVASCULAR: S1, S2 normal. No murmurs, rubs, or gallops.  ABDOMEN: Soft, mild tenderness in the right upper and lower quadrants with moderate tenderness in the left upper quadrant and moderate to severe tenderness in the left lower quadrant, mild guarding in the left lower quadrant. nondistended. Bowel sounds present. No organomegaly or mass.  EXTREMITIES: No pedal edema, cyanosis, or clubbing.  NEUROLOGIC: Cranial nerves II through XII are intact. Muscle strength 5/5 in all extremities. Sensation intact. Gait not checked.   SKIN: No obvious rash, lesion, or ulcer.   LABORATORY PANEL:   CBC  Recent Labs Lab 05/02/15 1901  WBC 18.3*  HGB 16.8*  HCT 49.6*  PLT 234  MCV 94.3  MCH 31.9  MCHC 33.9  RDW 14.6*   ------------------------------------------------------------------------------------------------------------------  Chemistries   Recent Labs Lab 05/02/15 1901  NA 135  K 3.6  CL 106  CO2 22  GLUCOSE 112*  BUN 11  CREATININE 0.80  CALCIUM 8.9  AST 25  ALT 21  ALKPHOS 108  BILITOT 0.6   ------------------------------------------------------------------------------------------------------------------ estimated creatinine clearance is 104.2 mL/min (by C-G formula based on Cr of 0.8). ------------------------------------------------------------------------------------------------------------------ No results for input(s): TSH, T4TOTAL, T3FREE, THYROIDAB in the last 72 hours.  Invalid input(s): FREET3   Coagulation profile No results for input(s): INR, PROTIME in the last 168 hours. ------------------------------------------------------------------------------------------------------------------- No  results for input(s): DDIMER in the last 72 hours. -------------------------------------------------------------------------------------------------------------------  Cardiac Enzymes  Recent Labs Lab 05/02/15 1901  TROPONINI <0.03   ------------------------------------------------------------------------------------------------------------------ Invalid input(s): POCBNP  ---------------------------------------------------------------------------------------------------------------  Urinalysis    Component Value Date/Time   COLORURINE YELLOW* 05/02/2015 1911   COLORURINE Straw 10/30/2011 1944   APPEARANCEUR CLEAR* 05/02/2015 1911   APPEARANCEUR Clear 10/30/2011 1944   LABSPEC 1.005 05/02/2015 1911   LABSPEC 1.001 10/30/2011 1944   PHURINE 7.0 05/02/2015 1911   PHURINE 6.0 10/30/2011 1944   GLUCOSEU NEGATIVE 05/02/2015 1911   GLUCOSEU Negative 10/30/2011 1944   HGBUR 1+* 05/02/2015 1911   HGBUR 2+ 10/30/2011 1944   BILIRUBINUR NEGATIVE 05/02/2015 1911   BILIRUBINUR Negative 10/30/2011 1944   KETONESUR NEGATIVE 05/02/2015 1911   KETONESUR Negative 10/30/2011 1944   PROTEINUR NEGATIVE 05/02/2015 1911   PROTEINUR Negative 10/30/2011 1944   NITRITE NEGATIVE 05/02/2015 1911   NITRITE Negative 10/30/2011 1944   LEUKOCYTESUR NEGATIVE 05/02/2015 1911   LEUKOCYTESUR Negative 10/30/2011 1944     RADIOLOGY: Dg Chest 2 View  05/02/2015  CLINICAL DATA:  Shortness of breath EXAM: CHEST  2 VIEW COMPARISON:  07/06/2012 chest radiograph. FINDINGS: Stable cardiomediastinal silhouette with normal heart size. No pneumothorax. No pleural effusion. Stable pleural-parenchymal scarring at the right costophrenic angle, unchanged from 07/16/2013 CT abdomen study. Hyperinflated lungs. No pulmonary edema. No acute consolidative airspace disease. IMPRESSION: Hyperinflated lungs, which could indicate COPD. Otherwise no active disease in the chest. Electronically Signed   By: Ilona Sorrel M.D.   On:  05/02/2015 19:32   Ct Abdomen Pelvis W Contrast  05/02/2015  CLINICAL DATA:  Acute onset of left lower quadrant abdominal pain. Nausea. Chills. Shortness of breath with exertion. Generalized fatigue. Initial encounter. EXAM: CT ABDOMEN AND PELVIS WITH CONTRAST TECHNIQUE: Multidetector CT imaging of the abdomen and pelvis was performed using the standard protocol following bolus administration of intravenous contrast. CONTRAST:  100 mL of Isovue 370 IV contrast COMPARISON:  CT of the abdomen and pelvis performed 07/16/2013 FINDINGS: The visualized lung bases are clear. Mild coronary artery calcifications are seen. The liver and spleen are unremarkable in appearance. The gallbladder is within normal limits. The pancreas and adrenal glands are unremarkable. The kidneys are unremarkable in appearance. There is no evidence of hydronephrosis. No renal or ureteral stones are seen. Mild nonspecific perinephric stranding is noted bilaterally. No free fluid is identified. The small bowel is unremarkable in appearance. The stomach is within normal limits. No acute vascular abnormalities are seen. Scattered calcification is noted along the abdominal aorta and its branches. The appendix is normal in caliber, without evidence of appendicitis. Contrast progresses to the level of the splenic flexure of the colon. Mild diverticulosis is noted along the distal descending and proximal sigmoid colon. Focal wall thickening is noted along the proximal sigmoid colon, with associated soft tissue inflammation, concerning  for acute diverticulitis. There is no evidence of perforation or abscess formation at this time. The bladder is mildly distended and grossly unremarkable. The uterus is unremarkable in appearance. The ovaries are relatively symmetric. No suspicious adnexal masses are seen. No inguinal lymphadenopathy is seen. No acute osseous abnormalities are identified. IMPRESSION: 1. Acute diverticulitis at the proximal sigmoid colon,  with focal wall thickening and soft tissue inflammation. No evidence of perforation or abscess formation at this time. If the patient's symptoms persist following treatment of diverticulitis, sigmoidoscopy could be considered for further evaluation. 2. Mild diverticulosis along the distal descending and proximal sigmoid colon. 3. Scattered calcification along the abdominal aorta and its branches. 4. Mild coronary artery calcifications seen. Electronically Signed   By: Garald Balding M.D.   On: 05/02/2015 22:43    EKG: Orders placed or performed during the hospital encounter of 05/02/15  . ED EKG  . ED EKG    ASSESSMENT  Principal Problem:   Acute diverticulitis Active Problems:   Sepsis secondary to acute diverticulitis   Tobacco abuse   Type 2 diabetes mellitus   PLAN   1). Acute diverticulitis with sepsis  - Patient presents with right lower quadrant abdominal pain with chills. She has a history of diverticulosis/diverticulitis. CT confirms acute diverticulitis without abscess or perforation. - Continue ciprofloxacin and metronidazole. Obtain blood cultures - Morphine for pain control. - Check lactic acid due to sepsis - Patient will need an outpatient GI consultation after discharge.  2). Tobacco abuse - Patient smokes 15 cigarettes per day and is not currently ready to quit. - Have counseled patient extensively on the need to quit smoking. Counseling lasted for about 4 minutes - Start nicotine replacement therapy  3). Type 2 diabetes mellitus - Patient has a history of type 2 diabetes and does not know her last A1c. She takes metformin and his compliance. - Hold metformin and start correctional insulin. - Check A1c - Consistent carbohydrate diet  All the records are reviewed and case discussed with ED provider. Management plans discussed with the patient, family and they are in agreement.  CODE STATUS: Code Status History    This patient does not have a recorded code  status. Please follow your organizational policy for patients in this situation.      I have independently reviewed all EKG and chest x-ray data  VTE prophylaxis: Lovenox if no contraindications and patient at low risk for bleeding. SCD's and progressive ambulation if patient has contraindications to anticoagulants. No DVT prophylaxis if patient presently receiving therapeutic anticoagulation or is at significant risk of bleeding for which the risk of anticoagulation outweigh the potential benefits.  Vaccinations: Pneumonia & flu vaccine per hospital protocol Prevention: Will proceed with conservative measures for the prevention of delirium in patients older than 65. Fall precautions and 1:1 sitter as needed per hospital protocol.  TOTAL TIME TAKING CARE OF THIS PATIENT: 45 minutes.    Sylvan Cheese M.D on 05/02/2015 at 11:46 PM  Between 7am to 6pm - Pager - [phone]  After 6pm go to [email or web address] - password [email or web address] [facility]  Office  [phone]  CC: Primary care physician; Donnie Coffin, MD

## 2015-05-02 NOTE — ED Provider Notes (Signed)
[facility] Emergency Department Provider Note  ____________________________________________    I have reviewed the triage vital signs and the nursing notes.   HISTORY  Chief Complaint Abdominal Pain    HPI Stacy Cox is a 60 y.o. female who presents with complaints of left lower quadrant abdominal pain for the last 3 days. She reports the pain is constant and cramping and moderate to severe in nature. She has had chills but no vomiting. Mild nausea. She does report a history of diverticulitis in the past. She reports normal stools.     Past Medical History  Diagnosis Date  . Polycythemia vera ([place])   . CHF (congestive heart failure) ([place])   . Joint pain   . Heart disease     Patient Active Problem List   Diagnosis Date Noted  . Polycythemia vera ([place]) 12/13/2014    History reviewed. No pertinent past surgical history.  Current Outpatient Rx  Name  Route  Sig  Dispense  Refill  . aspirin 325 MG EC tablet   Oral   Take 325 mg by mouth daily.         Marland Kitchen b complex vitamins tablet   Oral   Take 1 tablet by mouth daily.         Marland Kitchen COREG CR 80 MG 24 hr capsule   Oral   Take 1 tablet by mouth daily.           Dispense as written.   . furosemide (LASIX) 40 MG tablet   Oral   Take 1 tablet by mouth daily.         Marland Kitchen losartan (COZAAR) 25 MG tablet   Oral   Take 1 tablet by mouth daily.         . meloxicam (MOBIC) 7.5 MG tablet   Oral   Take 1 tablet by mouth daily.         . metFORMIN (GLUCOPHAGE) 500 MG tablet   Oral   Take 1 tablet by mouth daily with breakfast.         . Multiple Vitamins-Minerals (MULTIVITAL PO)   Oral   Take 1 tablet by mouth daily.         . pantoprazole (PROTONIX) 40 MG tablet   Oral   Take 1 tablet by mouth daily.         . potassium chloride SA (K-DUR,KLOR-CON) 20 MEQ tablet   Oral   Take 1 tablet by mouth daily.         . rosuvastatin (CRESTOR) 10 MG tablet   Oral   Take 1  tablet by mouth daily.         Marland Kitchen spironolactone (ALDACTONE) 25 MG tablet   Oral   Take 1 tablet by mouth daily.           Allergies Review of patient's allergies indicates no known allergies.  Family History  Problem Relation Age of Onset  . Breast cancer Sister     sister #1 age 48  . Kidney cancer Sister     sister #1 age -5's  . Breast cancer Sister     sister #2 age 33's  . Kidney disease Sister     sister #2 age 29's  . Diabetes Mother   . Diabetes Sister   . Heart disease Mother     + BYPASS SURGERY  . Heart disease Sister   . Hypertension Brother   . Deep vein thrombosis Mother   . Arthritis Mother  Social History Social History  Substance Use Topics  . Smoking status: Current Every Day Smoker -- 0.25 packs/day for 40 years    Types: Cigarettes  . Smokeless tobacco: Never Used     Comment: smoking since teens  . Alcohol Use: No    Review of Systems  Constitutional: Negative for fever. Eyes: Negative for redness ENT: Negative for sore throat Cardiovascular: Negative for chest pain Respiratory: Negative for cough Gastrointestinal: As above Genitourinary: Negative for dysuria. Musculoskeletal: Negative for back pain. Skin: Negative for rash. Neurological: Negative for headache Psychiatric: no anxiety    ____________________________________________   PHYSICAL EXAM:  VITAL SIGNS: ED Triage Vitals  Enc Vitals Group     BP 05/02/15 1858 152/80 mmHg     Pulse Rate 05/02/15 1858 115     Resp 05/02/15 1858 20     Temp 05/02/15 1858 99.3 F (37.4 C)     Temp Source 05/02/15 1858 Oral     SpO2 05/02/15 1858 95 %     Weight 05/02/15 1858 258 lb (117.028 kg)     Height 05/02/15 1858 5\' 8"  (1.727 m)     Head Cir --      Peak Flow --      Pain Score 05/02/15 1858 7     Pain Loc --      Pain Edu? --      Excl. in [place]? --      Constitutional: Alert and oriented. Well appearing and in no distress.  Eyes: Conjunctivae are normal. No  erythema or injection ENT   Head: Normocephalic and atraumatic.   Mouth/Throat: Mucous membranes are moist. Cardiovascular: Tachycardia, regular rhythm. Normal and symmetric distal pulses are present in the upper extremities. Respiratory: Normal respiratory effort without tachypnea nor retractions. Breath sounds are clear and equal bilaterally.  Gastrointestinal: Left lower quadrant tenderness to palpation, moderate. No distention. There is no CVA tenderness. Genitourinary: deferred Musculoskeletal: Nontender with normal range of motion in all extremities. No lower extremity tenderness nor edema. Neurologic:  Normal speech and language. No gross focal neurologic deficits are appreciated. Skin:  Skin is warm, dry and intact. No rash noted. Psychiatric: Mood and affect are normal. Patient exhibits appropriate insight and judgment.  ____________________________________________    LABS (pertinent positives/negatives)  Labs Reviewed  COMPREHENSIVE METABOLIC PANEL - Abnormal; Notable for the following:    Glucose, Bld 112 (*)    All other components within normal limits  CBC - Abnormal; Notable for the following:    WBC 18.3 (*)    RBC 5.26 (*)    Hemoglobin 16.8 (*)    HCT 49.6 (*)    RDW 14.6 (*)    All other components within normal limits  URINALYSIS COMPLETEWITH MICROSCOPIC ([facility] ONLY) - Abnormal; Notable for the following:    Color, Urine YELLOW (*)    APPearance CLEAR (*)    Hgb urine dipstick 1+ (*)    Squamous Epithelial / LPF 0-5 (*)    All other components within normal limits  LIPASE, BLOOD  TROPONIN I    ____________________________________________   EKG  ED ECG REPORT I, Lavonia Drafts, the attending physician, personally viewed and interpreted this ECG.  Date: 05/02/2015 EKG Time: 7:08 PM Rate: 116 Rhythm: Sinus tachycardia QRS Axis: normal Intervals: normal ST/T Wave abnormalities: normal Conduction Disturbances: none Narrative Interpretation:  Occasional PVCs   ____________________________________________    RADIOLOGY  Chest x-ray unremarkable CT scan shows acute diverticulitis ____________________________________________   PROCEDURES  Procedure(s) performed: none  Critical  Care performed: none  ____________________________________________   INITIAL IMPRESSION / ASSESSMENT AND PLAN / ED COURSE  Pertinent labs & imaging results that were available during my care of the patient were reviewed by me and considered in my medical decision making (see chart for details).  Patient presents with tachycardia, elevated white blood cell count and left lower quadrant pain. She is a history of diverticulitis and certainly this could be a recurrence. We will obtain CT abdomen pelvis to evaluate further ----------------------------------------- 10:53 PM on 05/02/2015 -----------------------------------------  CT scan shows acute diverticulitis. Given elevated white count, initial tachycardia and borderline temperature and increasing pain will admit the patient to the hospital with IV Cipro and IV Flagyl ____________________________________________   FINAL CLINICAL IMPRESSION(S) / ED DIAGNOSES  Final diagnoses:  Diverticulitis of large intestine without perforation or abscess without bleeding          Lavonia Drafts, MD 05/02/15 2253

## 2015-05-03 LAB — BASIC METABOLIC PANEL
Anion gap: 3 — ABNORMAL LOW (ref 5–15)
BUN: 12 mg/dL (ref 6–20)
CO2: 27 mmol/L (ref 22–32)
Calcium: 8.3 mg/dL — ABNORMAL LOW (ref 8.9–10.3)
Chloride: 107 mmol/L (ref 101–111)
Creatinine, Ser: 0.8 mg/dL (ref 0.44–1.00)
GFR calc Af Amer: >60 mL/min (ref >60)
GLUCOSE: 92 mg/dL (ref 65–99)
POTASSIUM: 3.7 mmol/L (ref 3.5–5.1)
Sodium: 137 mmol/L (ref 135–145)

## 2015-05-03 LAB — HEMOGLOBIN A1C: Hgb A1c MFr Bld: 6.1 % — ABNORMAL HIGH (ref 4.0–6.0)

## 2015-05-03 LAB — GLUCOSE, CAPILLARY
GLUCOSE-CAPILLARY: 121 mg/dL — AB (ref 65–99)
Glucose-Capillary: 82 mg/dL (ref 65–99)

## 2015-05-03 LAB — CBC
HEMATOCRIT: 45.2 % (ref 35.0–47.0)
HEMOGLOBIN: 15.3 g/dL (ref 12.0–16.0)
MCH: 32.2 pg (ref 26.0–34.0)
MCHC: 33.8 g/dL (ref 32.0–36.0)
MCV: 95.3 fL (ref 80.0–100.0)
Platelets: 211 10*3/uL (ref 150–440)
RBC: 4.74 MIL/uL (ref 3.80–5.20)
RDW: 14.8 % — AB (ref 11.5–14.5)
WBC: 17.1 10*3/uL — ABNORMAL HIGH (ref 3.6–11.0)

## 2015-05-03 LAB — LACTIC ACID, PLASMA
LACTIC ACID, VENOUS: 0.9 mmol/L (ref 0.5–2.0)
Lactic Acid, Venous: 1.7 mmol/L (ref 0.5–2.0)

## 2015-05-03 MED ORDER — CIPROFLOXACIN HCL 500 MG PO TABS: 500.0000 mg | ORAL_TABLET | Freq: Two times a day (BID) | ORAL | Status: DC

## 2015-05-03 MED ORDER — CARVEDILOL PHOSPHATE ER 20 MG PO CP24
80.0000 mg | ORAL_CAPSULE | Freq: Every day | ORAL | Status: DC
  Filled 2015-05-03 (×2): qty 4

## 2015-05-03 MED ORDER — FUROSEMIDE 40 MG PO TABS
40.0000 mg | ORAL_TABLET | Freq: Every day | ORAL | Status: DC
  Administered 2015-05-03: 40 mg via ORAL
  Filled 2015-05-03: qty 1

## 2015-05-03 MED ORDER — LOSARTAN POTASSIUM 25 MG PO TABS
25.0000 mg | ORAL_TABLET | Freq: Every day | ORAL | Status: DC
  Administered 2015-05-03: 25 mg via ORAL
  Filled 2015-05-03: qty 1

## 2015-05-03 MED ORDER — ASPIRIN EC 325 MG PO TBEC
325.0000 mg | DELAYED_RELEASE_TABLET | Freq: Every day | ORAL | Status: DC
  Administered 2015-05-03: 325 mg via ORAL
  Filled 2015-05-03: qty 1

## 2015-05-03 MED ORDER — POTASSIUM CHLORIDE CRYS ER 20 MEQ PO TBCR
20.0000 meq | EXTENDED_RELEASE_TABLET | Freq: Every day | ORAL | Status: DC
  Administered 2015-05-03: 20 meq via ORAL
  Filled 2015-05-03: qty 1

## 2015-05-03 MED ORDER — MORPHINE SULFATE (PF) 2 MG/ML IV SOLN
2.0000 mg | INTRAVENOUS | Status: DC | PRN
  Administered 2015-05-03 (×2): 2 mg via INTRAVENOUS
  Filled 2015-05-03 (×2): qty 1

## 2015-05-03 MED ORDER — ENOXAPARIN SODIUM 40 MG/0.4ML ~~LOC~~ SOLN
40.0000 mg | SUBCUTANEOUS | Status: DC
  Administered 2015-05-03: 40 mg via SUBCUTANEOUS
  Filled 2015-05-03: qty 0.4

## 2015-05-03 MED ORDER — ADULT MULTIVITAMIN W/MINERALS CH: 1.0000 | ORAL_TABLET | Freq: Every day | ORAL | Status: DC

## 2015-05-03 MED ORDER — METRONIDAZOLE 500 MG PO TABS: 500.0000 mg | ORAL_TABLET | Freq: Three times a day (TID) | ORAL | Status: DC

## 2015-05-03 MED ORDER — NICOTINE 14 MG/24HR TD PT24: 14.0000 mg | MEDICATED_PATCH | Freq: Every day | TRANSDERMAL | Status: DC

## 2015-05-03 MED ORDER — ACETAMINOPHEN-CODEINE #2 300-15 MG PO TABS: 1.0000 | ORAL_TABLET | ORAL | Status: DC | PRN

## 2015-05-03 MED ORDER — LACTINEX PO CHEW: 1.0000 | CHEWABLE_TABLET | Freq: Three times a day (TID) | ORAL | Status: DC

## 2015-05-03 MED ORDER — INSULIN ASPART 100 UNIT/ML ~~LOC~~ SOLN: 0.0000 [IU] | Freq: Three times a day (TID) | SUBCUTANEOUS | Status: DC

## 2015-05-03 MED ORDER — CIPROFLOXACIN IN D5W 400 MG/200ML IV SOLN
400.0000 mg | Freq: Two times a day (BID) | INTRAVENOUS | Status: DC
  Administered 2015-05-03: 400 mg via INTRAVENOUS
  Filled 2015-05-03 (×3): qty 200

## 2015-05-03 MED ORDER — SPIRONOLACTONE 25 MG PO TABS
25.0000 mg | ORAL_TABLET | Freq: Every day | ORAL | Status: DC
  Administered 2015-05-03: 25 mg via ORAL
  Filled 2015-05-03: qty 1

## 2015-05-03 MED ORDER — PANTOPRAZOLE SODIUM 40 MG PO TBEC
40.0000 mg | DELAYED_RELEASE_TABLET | Freq: Every day | ORAL | Status: DC
  Administered 2015-05-03: 40 mg via ORAL
  Filled 2015-05-03: qty 1

## 2015-05-03 MED ORDER — B COMPLEX PO TABS: 1.0000 | ORAL_TABLET | Freq: Every day | ORAL | Status: DC

## 2015-05-03 MED ORDER — METRONIDAZOLE IN NACL 5-0.79 MG/ML-% IV SOLN
500.0000 mg | Freq: Three times a day (TID) | INTRAVENOUS | Status: DC
  Administered 2015-05-03: 500 mg via INTRAVENOUS
  Filled 2015-05-03 (×4): qty 100

## 2015-05-03 MED ORDER — NICOTINE 14 MG/24HR TD PT24
14.0000 mg | MEDICATED_PATCH | Freq: Every day | TRANSDERMAL | Status: DC
  Filled 2015-05-03: qty 1

## 2015-05-03 NOTE — Progress Notes (Signed)
05/03/2015 1:44 PM  BP 125/74 mmHg  Pulse 81  Temp(Src) 99 F (37.2 C) (Oral)  Resp 18  Ht 5\' 8"  (1.727 m)  Wt 116.212 kg (256 lb 3.2 oz)  BMI 38.96 kg/m2  SpO2 99% Patient discharged per MD orders. Discharge instructions reviewed with patient and patient verbalized understanding. IV removed per policy. Prescriptions discussed and given to patient. Discharged via wheelchair escorted by nursing staff.  Almedia Balls, RN

## 2015-05-03 NOTE — Discharge Summary (Signed)
[facility] NAME: Stacy Cox    MR#:  [id number]  DATE OF BIRTH:  [birth year]  DATE OF ADMISSION:  05/02/2015 ADMITTING PHYSICIAN: Saundra Shelling, MD  DATE OF DISCHARGE: 05/03/2015  PRIMARY CARE PHYSICIAN: Donnie Coffin, MD    ADMISSION DIAGNOSIS:  Diverticulitis of large intestine without perforation or abscess without bleeding [K57.32]  DISCHARGE DIAGNOSIS:  Principal Problem:   Acute diverticulitis Active Problems:   Sepsis secondary to acute diverticulitis   Tobacco abuse   Type 2 diabetes mellitus    SECONDARY DIAGNOSIS:   Past Medical History  Diagnosis Date  . Polycythemia vera ([place])   . CHF (congestive heart failure) ([place])   . Joint pain   . Heart disease   . Tobacco abuse   . GERD (gastroesophageal reflux disease)   . Diabetes mellitus, type 2 ([place])   . COPD (chronic obstructive pulmonary disease) [facility])     HOSPITAL COURSE:    60 y.o. female with a known history of Polycythemia, CHF, tobacco abuse, diabetes, GERD and COPD. She presents to the ED today due to a 2 day worsening of her intermittent abdominal pain.  1). Acute diverticulitis with sepsis  Patient presented with right lower quadrant abdominal pain with chills. CT confirms acute diverticulitis without abscess or perforation. She will continue ciprofloxacin and metronidazole. She will have follow up with her PCP and need to be referred to GI and possibly surgery as this is her second case of diverticultis.   2). Tobacco abuse She is discharged on nicotine replacement therapy and was counseled on quitting smoking.  3). Type 2 diabetes mellitus She may resume outpatient medications and ADA diet  DISCHARGE CONDITIONS AND DIET:   Stable ADA diet  CONSULTS OBTAINED:     DRUG ALLERGIES:  No Known Allergies  DISCHARGE MEDICATIONS:   Current Discharge Medication List    START taking these medications   Details   acetaminophen-codeine (TYLENOL #2) 300-15 MG tablet Take 1 tablet by mouth every 4 (four) hours as needed for moderate pain. Qty: 30 tablet, Refills: 0    ciprofloxacin (CIPRO) 500 MG tablet Take 1 tablet (500 mg total) by mouth 2 (two) times daily. Qty: 24 tablet, Refills: 0    lactobacillus acidophilus & bulgar (LACTINEX) chewable tablet Chew 1 tablet by mouth 3 (three) times daily with meals. Qty: 60 tablet, Refills: 0    metroNIDAZOLE (FLAGYL) 500 MG tablet Take 1 tablet (500 mg total) by mouth 3 (three) times daily. Qty: 36 tablet, Refills: 0    nicotine (NICODERM CQ - DOSED IN MG/24 HOURS) 14 mg/24hr patch Place 1 patch (14 mg total) onto the skin daily. Qty: 28 patch, Refills: 0      CONTINUE these medications which have NOT CHANGED   Details  aspirin 325 MG EC tablet Take 325 mg by mouth daily.    b complex vitamins tablet Take 1 tablet by mouth every other day.    Associated Diagnoses: Polycythemia vera ([facility])    COREG CR 80 MG 24 hr capsule Take 1 tablet by mouth daily.    furosemide (LASIX) 40 MG tablet Take 1 tablet by mouth daily.    losartan (COZAAR) 25 MG tablet Take 1 tablet by mouth daily.    meloxicam (MOBIC) 7.5 MG tablet Take 1 tablet by mouth daily.    metFORMIN (GLUCOPHAGE) 500 MG tablet Take 1 tablet by mouth daily with breakfast.    Multiple Vitamins-Minerals (MULTIVITAL PO) Take 1  tablet by mouth daily.   Associated Diagnoses: Polycythemia vera ([place])    pantoprazole (PROTONIX) 40 MG tablet Take 1 tablet by mouth daily.    potassium chloride SA (K-DUR,KLOR-CON) 20 MEQ tablet Take 1 tablet by mouth daily.    spironolactone (ALDACTONE) 25 MG tablet Take 1 tablet by mouth daily.              Today   CHIEF COMPLAINT:  Doing well abdominal pain better after eating   VITAL SIGNS:  Blood pressure 125/74, pulse 81, temperature 99 F (37.2 C), temperature source Oral, resp. rate 18, height 5\' 8"  (1.727 m), weight 116.212 kg (256 lb 3.2 oz),  SpO2 99 %.   REVIEW OF SYSTEMS:  Review of Systems  Constitutional: Negative for fever, chills and malaise/fatigue.  HENT: Negative for ear discharge, ear pain, hearing loss, nosebleeds and sore throat.   Eyes: Negative for blurred vision and pain.  Respiratory: Negative for cough, hemoptysis, shortness of breath and wheezing.   Cardiovascular: Negative for chest pain, palpitations and leg swelling.  Gastrointestinal: Negative for nausea, vomiting, abdominal pain, diarrhea and blood in stool.  Genitourinary: Negative for dysuria.  Musculoskeletal: Negative for back pain.  Neurological: Negative for dizziness, tremors, speech change, focal weakness, seizures and headaches.  Endo/Heme/Allergies: Does not bruise/bleed easily.  Psychiatric/Behavioral: Negative for depression, suicidal ideas and hallucinations.     PHYSICAL EXAMINATION:  GENERAL:  60 y.o.-year-old patient lying in the bed with no acute distress.  NECK:  Supple, no jugular venous distention. No thyroid enlargement, no tenderness.  LUNGS: Normal breath sounds bilaterally, no wheezing, rales,rhonchi  No use of accessory muscles of respiration.  CARDIOVASCULAR: S1, S2 normal. No murmurs, rubs, or gallops.  ABDOMEN: Soft, non-tender, non-distended. Bowel sounds present. No organomegaly or mass.  EXTREMITIES: No pedal edema, cyanosis, or clubbing.  PSYCHIATRIC: The patient is alert and oriented x 3.  SKIN: No obvious rash, lesion, or ulcer.   DATA REVIEW:   CBC  Recent Labs Lab 05/03/15 0423  WBC 17.1*  HGB 15.3  HCT 45.2  PLT 211    Chemistries   Recent Labs Lab 05/02/15 1901 05/03/15 0423  NA 135 137  K 3.6 3.7  CL 106 107  CO2 22 27  GLUCOSE 112* 92  BUN 11 12  CREATININE 0.80 0.80  CALCIUM 8.9 8.3*  AST 25  --   ALT 21  --   ALKPHOS 108  --   BILITOT 0.6  --     Cardiac Enzymes  Recent Labs Lab 05/02/15 1901  TROPONINI <0.03    Microbiology Results  @[email or web address] @  RADIOLOGY:  Dg  Chest 2 View  05/02/2015  CLINICAL DATA:  Shortness of breath EXAM: CHEST  2 VIEW COMPARISON:  07/06/2012 chest radiograph. FINDINGS: Stable cardiomediastinal silhouette with normal heart size. No pneumothorax. No pleural effusion. Stable pleural-parenchymal scarring at the right costophrenic angle, unchanged from 07/16/2013 CT abdomen study. Hyperinflated lungs. No pulmonary edema. No acute consolidative airspace disease. IMPRESSION: Hyperinflated lungs, which could indicate COPD. Otherwise no active disease in the chest. Electronically Signed   By: Ilona Sorrel M.D.   On: 05/02/2015 19:32   Ct Abdomen Pelvis W Contrast  05/02/2015  CLINICAL DATA:  Acute onset of left lower quadrant abdominal pain. Nausea. Chills. Shortness of breath with exertion. Generalized fatigue. Initial encounter. EXAM: CT ABDOMEN AND PELVIS WITH CONTRAST TECHNIQUE: Multidetector CT imaging of the abdomen and pelvis was performed using the standard protocol following bolus administration of intravenous contrast. CONTRAST:  100  mL of Isovue 370 IV contrast COMPARISON:  CT of the abdomen and pelvis performed 07/16/2013 FINDINGS: The visualized lung bases are clear. Mild coronary artery calcifications are seen. The liver and spleen are unremarkable in appearance. The gallbladder is within normal limits. The pancreas and adrenal glands are unremarkable. The kidneys are unremarkable in appearance. There is no evidence of hydronephrosis. No renal or ureteral stones are seen. Mild nonspecific perinephric stranding is noted bilaterally. No free fluid is identified. The small bowel is unremarkable in appearance. The stomach is within normal limits. No acute vascular abnormalities are seen. Scattered calcification is noted along the abdominal aorta and its branches. The appendix is normal in caliber, without evidence of appendicitis. Contrast progresses to the level of the splenic flexure of the colon. Mild diverticulosis is noted along the distal  descending and proximal sigmoid colon. Focal wall thickening is noted along the proximal sigmoid colon, with associated soft tissue inflammation, concerning for acute diverticulitis. There is no evidence of perforation or abscess formation at this time. The bladder is mildly distended and grossly unremarkable. The uterus is unremarkable in appearance. The ovaries are relatively symmetric. No suspicious adnexal masses are seen. No inguinal lymphadenopathy is seen. No acute osseous abnormalities are identified. IMPRESSION: 1. Acute diverticulitis at the proximal sigmoid colon, with focal wall thickening and soft tissue inflammation. No evidence of perforation or abscess formation at this time. If the patient's symptoms persist following treatment of diverticulitis, sigmoidoscopy could be considered for further evaluation. 2. Mild diverticulosis along the distal descending and proximal sigmoid colon. 3. Scattered calcification along the abdominal aorta and its branches. 4. Mild coronary artery calcifications seen. Electronically Signed   By: Garald Balding M.D.   On: 05/02/2015 22:43      Management plans discussed with the patient and she is in agreement. Stable for discharge home  Patient should follow up with PCP   CODE STATUS:     Code Status Orders        Start     Ordered   05/03/15 0737  Full code   Continuous     05/03/15 0738    Code Status History    Date Active Date Inactive Code Status Order ID Comments User Context   05/03/2015  1:30 AM 05/03/2015  7:36 AM DNR [id number]  Sylvan Cheese, MD ED      TOTAL TIME TAKING CARE OF THIS PATIENT: 35 minutes.    Note: This dictation was prepared with Dragon dictation along with smaller phrase technology. Any transcriptional errors that result from this process are unintentional.  Paislea Hatton M.D on 05/03/2015 at 12:28 PM  Between 7am to 6pm - Pager - [phone] After 6pm go to [email or web address] - password [email or web address]  [facility]  Office  [phone]  CC: Primary care physician; Donnie Coffin, MD

## 2015-05-05 ENCOUNTER — Inpatient Hospital Stay: Payer: Medicare HMO

## 2015-05-05 ENCOUNTER — Inpatient Hospital Stay: Payer: Medicare HMO | Admitting: Internal Medicine

## 2015-05-05 ENCOUNTER — Telehealth: Payer: Self-pay | Admitting: *Deleted

## 2015-05-05 NOTE — Telephone Encounter (Signed)
-----   Message from Stacy Cox sent at 05/05/2015  3:25 PM EDT ----- Per pt she's on antibodies and will call to r/s later

## 2015-05-05 NOTE — Telephone Encounter (Signed)
Pt called and spoke to scheduling. She is presently sick and on antibiotics. She will call back to r/s her apt with Dr. Rogue Bussing at a later date.

## 2015-05-08 LAB — CULTURE, BLOOD (ROUTINE X 2)
CULTURE: NO GROWTH
CULTURE: NO GROWTH

## 2016-03-29 ENCOUNTER — Other Ambulatory Visit: Payer: Self-pay | Admitting: Family Medicine

## 2016-03-29 DIAGNOSIS — R928 Other abnormal and inconclusive findings on diagnostic imaging of breast: Secondary | ICD-10-CM

## 2016-04-19 ENCOUNTER — Ambulatory Visit
Admission: RE | Admit: 2016-04-19 | Discharge: 2016-04-19 | Disposition: A | Discharge status: Home / Self Care | Payer: Medicare HMO | Source: Ambulatory Visit | Attending: Family Medicine | Admitting: Family Medicine

## 2016-04-19 DIAGNOSIS — R928 Other abnormal and inconclusive findings on diagnostic imaging of breast: Secondary | ICD-10-CM

## 2017-05-13 ENCOUNTER — Other Ambulatory Visit: Payer: Self-pay | Admitting: Family Medicine

## 2017-05-13 DIAGNOSIS — Z1239 Encounter for other screening for malignant neoplasm of breast: Secondary | ICD-10-CM

## 2017-06-02 ENCOUNTER — Ambulatory Visit
Admission: RE | Admit: 2017-06-02 | Discharge: 2017-06-02 | Disposition: A | Discharge status: Home / Self Care | Payer: Medicare HMO | Source: Ambulatory Visit | Attending: Family Medicine | Admitting: Family Medicine

## 2017-06-02 DIAGNOSIS — N6323 Unspecified lump in the left breast, lower outer quadrant: Secondary | ICD-10-CM | POA: Diagnosis not present

## 2017-06-02 DIAGNOSIS — Z1239 Encounter for other screening for malignant neoplasm of breast: Secondary | ICD-10-CM | POA: Diagnosis present

## 2017-06-02 DIAGNOSIS — N6321 Unspecified lump in the left breast, upper outer quadrant: Secondary | ICD-10-CM | POA: Diagnosis not present

## 2017-06-02 DIAGNOSIS — N6311 Unspecified lump in the right breast, upper outer quadrant: Secondary | ICD-10-CM | POA: Diagnosis not present

## 2018-07-24 ENCOUNTER — Other Ambulatory Visit: Payer: Self-pay | Admitting: Family Medicine

## 2018-07-24 DIAGNOSIS — R928 Other abnormal and inconclusive findings on diagnostic imaging of breast: Secondary | ICD-10-CM

## 2018-07-24 DIAGNOSIS — Z803 Family history of malignant neoplasm of breast: Secondary | ICD-10-CM

## 2018-08-24 ENCOUNTER — Ambulatory Visit
Admission: RE | Admit: 2018-08-24 | Discharge: 2018-08-24 | Disposition: A | Discharge status: Home / Self Care | Payer: Medicare HMO | Source: Ambulatory Visit | Attending: Family Medicine | Admitting: Family Medicine

## 2018-08-24 DIAGNOSIS — R928 Other abnormal and inconclusive findings on diagnostic imaging of breast: Secondary | ICD-10-CM | POA: Diagnosis present

## 2018-08-24 DIAGNOSIS — Z803 Family history of malignant neoplasm of breast: Secondary | ICD-10-CM

## 2019-08-09 ENCOUNTER — Other Ambulatory Visit: Payer: Self-pay | Admitting: Family Medicine

## 2019-08-09 DIAGNOSIS — Z1231 Encounter for screening mammogram for malignant neoplasm of breast: Secondary | ICD-10-CM

## 2019-08-29 ENCOUNTER — Ambulatory Visit
Admission: RE | Admit: 2019-08-29 | Discharge: 2019-08-29 | Disposition: A | Discharge status: Home / Self Care | Payer: Medicare HMO | Source: Ambulatory Visit | Attending: Family Medicine | Admitting: Family Medicine

## 2019-08-29 DIAGNOSIS — Z1231 Encounter for screening mammogram for malignant neoplasm of breast: Secondary | ICD-10-CM | POA: Insufficient documentation

## 2019-09-10 ENCOUNTER — Other Ambulatory Visit: Payer: Self-pay | Admitting: Family Medicine

## 2019-09-10 DIAGNOSIS — N6320 Unspecified lump in the left breast, unspecified quadrant: Secondary | ICD-10-CM

## 2019-09-10 DIAGNOSIS — R928 Other abnormal and inconclusive findings on diagnostic imaging of breast: Secondary | ICD-10-CM

## 2019-09-13 ENCOUNTER — Ambulatory Visit
Admission: RE | Admit: 2019-09-13 | Discharge: 2019-09-13 | Disposition: A | Discharge status: Home / Self Care | Payer: Medicare HMO | Source: Ambulatory Visit | Attending: Family Medicine | Admitting: Family Medicine

## 2019-09-13 ENCOUNTER — Other Ambulatory Visit: Payer: Self-pay

## 2019-09-13 DIAGNOSIS — N6320 Unspecified lump in the left breast, unspecified quadrant: Secondary | ICD-10-CM | POA: Diagnosis present

## 2019-09-13 DIAGNOSIS — R928 Other abnormal and inconclusive findings on diagnostic imaging of breast: Secondary | ICD-10-CM | POA: Insufficient documentation

## 2019-09-20 ENCOUNTER — Other Ambulatory Visit: Payer: Self-pay | Admitting: Family Medicine

## 2019-09-20 DIAGNOSIS — N6320 Unspecified lump in the left breast, unspecified quadrant: Secondary | ICD-10-CM

## 2020-09-03 ENCOUNTER — Other Ambulatory Visit: Payer: Self-pay | Admitting: Physician Assistant

## 2020-09-03 DIAGNOSIS — M5442 Lumbago with sciatica, left side: Secondary | ICD-10-CM

## 2020-09-03 DIAGNOSIS — G8929 Other chronic pain: Secondary | ICD-10-CM

## 2020-09-17 ENCOUNTER — Other Ambulatory Visit: Payer: Self-pay

## 2020-09-17 ENCOUNTER — Ambulatory Visit
Admission: RE | Admit: 2020-09-17 | Discharge: 2020-09-17 | Disposition: A | Discharge status: Home / Self Care | Payer: Medicare HMO | Source: Ambulatory Visit | Attending: Physician Assistant | Admitting: Physician Assistant

## 2020-09-17 DIAGNOSIS — G8929 Other chronic pain: Secondary | ICD-10-CM | POA: Diagnosis present

## 2020-09-17 DIAGNOSIS — M5441 Lumbago with sciatica, right side: Secondary | ICD-10-CM | POA: Insufficient documentation

## 2020-09-17 DIAGNOSIS — M5442 Lumbago with sciatica, left side: Secondary | ICD-10-CM | POA: Diagnosis present

## 2020-11-02 ENCOUNTER — Inpatient Hospital Stay
Admission: EM | Admit: 2020-11-02 | Discharge: 2020-11-07 | DRG: 871 | Disposition: A | Discharge status: Home / Self Care | Payer: Medicare HMO | Source: Ambulatory Visit | Attending: Internal Medicine | Admitting: Internal Medicine

## 2020-11-02 ENCOUNTER — Other Ambulatory Visit: Payer: Self-pay

## 2020-11-02 ENCOUNTER — Emergency Department: Payer: Medicare HMO

## 2020-11-02 DIAGNOSIS — Z6837 Body mass index (BMI) 37.0-37.9, adult: Secondary | ICD-10-CM

## 2020-11-02 DIAGNOSIS — Z7984 Long term (current) use of oral hypoglycemic drugs: Secondary | ICD-10-CM

## 2020-11-02 DIAGNOSIS — Z20822 Contact with and (suspected) exposure to covid-19: Secondary | ICD-10-CM | POA: Diagnosis present

## 2020-11-02 DIAGNOSIS — R710 Precipitous drop in hematocrit: Secondary | ICD-10-CM

## 2020-11-02 DIAGNOSIS — E860 Dehydration: Secondary | ICD-10-CM | POA: Diagnosis present

## 2020-11-02 DIAGNOSIS — I5032 Chronic diastolic (congestive) heart failure: Secondary | ICD-10-CM | POA: Diagnosis not present

## 2020-11-02 DIAGNOSIS — A408 Other streptococcal sepsis: Secondary | ICD-10-CM | POA: Diagnosis present

## 2020-11-02 DIAGNOSIS — R319 Hematuria, unspecified: Secondary | ICD-10-CM | POA: Diagnosis present

## 2020-11-02 DIAGNOSIS — E1140 Type 2 diabetes mellitus with diabetic neuropathy, unspecified: Secondary | ICD-10-CM | POA: Diagnosis present

## 2020-11-02 DIAGNOSIS — B955 Unspecified streptococcus as the cause of diseases classified elsewhere: Secondary | ICD-10-CM | POA: Diagnosis not present

## 2020-11-02 DIAGNOSIS — I9589 Other hypotension: Secondary | ICD-10-CM | POA: Diagnosis present

## 2020-11-02 DIAGNOSIS — N179 Acute kidney failure, unspecified: Secondary | ICD-10-CM | POA: Diagnosis present

## 2020-11-02 DIAGNOSIS — Z791 Long term (current) use of non-steroidal anti-inflammatories (NSAID): Secondary | ICD-10-CM

## 2020-11-02 DIAGNOSIS — J9600 Acute respiratory failure, unspecified whether with hypoxia or hypercapnia: Secondary | ICD-10-CM

## 2020-11-02 DIAGNOSIS — Z8249 Family history of ischemic heart disease and other diseases of the circulatory system: Secondary | ICD-10-CM

## 2020-11-02 DIAGNOSIS — I509 Heart failure, unspecified: Secondary | ICD-10-CM | POA: Insufficient documentation

## 2020-11-02 DIAGNOSIS — I10 Essential (primary) hypertension: Secondary | ICD-10-CM | POA: Diagnosis not present

## 2020-11-02 DIAGNOSIS — I5022 Chronic systolic (congestive) heart failure: Secondary | ICD-10-CM

## 2020-11-02 DIAGNOSIS — Z841 Family history of disorders of kidney and ureter: Secondary | ICD-10-CM

## 2020-11-02 DIAGNOSIS — R7881 Bacteremia: Secondary | ICD-10-CM | POA: Diagnosis not present

## 2020-11-02 DIAGNOSIS — K5720 Diverticulitis of large intestine with perforation and abscess without bleeding: Secondary | ICD-10-CM | POA: Diagnosis present

## 2020-11-02 DIAGNOSIS — G4733 Obstructive sleep apnea (adult) (pediatric): Secondary | ICD-10-CM | POA: Diagnosis present

## 2020-11-02 DIAGNOSIS — Z720 Tobacco use: Secondary | ICD-10-CM | POA: Diagnosis present

## 2020-11-02 DIAGNOSIS — J9601 Acute respiratory failure with hypoxia: Secondary | ICD-10-CM | POA: Diagnosis not present

## 2020-11-02 DIAGNOSIS — I480 Paroxysmal atrial fibrillation: Secondary | ICD-10-CM | POA: Diagnosis present

## 2020-11-02 DIAGNOSIS — K219 Gastro-esophageal reflux disease without esophagitis: Secondary | ICD-10-CM | POA: Diagnosis present

## 2020-11-02 DIAGNOSIS — L0291 Cutaneous abscess, unspecified: Secondary | ICD-10-CM

## 2020-11-02 DIAGNOSIS — E785 Hyperlipidemia, unspecified: Secondary | ICD-10-CM | POA: Diagnosis present

## 2020-11-02 DIAGNOSIS — I110 Hypertensive heart disease with heart failure: Secondary | ICD-10-CM | POA: Diagnosis present

## 2020-11-02 DIAGNOSIS — J449 Chronic obstructive pulmonary disease, unspecified: Secondary | ICD-10-CM | POA: Diagnosis present

## 2020-11-02 DIAGNOSIS — K750 Abscess of liver: Secondary | ICD-10-CM | POA: Diagnosis present

## 2020-11-02 DIAGNOSIS — Z79899 Other long term (current) drug therapy: Secondary | ICD-10-CM

## 2020-11-02 DIAGNOSIS — A419 Sepsis, unspecified organism: Secondary | ICD-10-CM

## 2020-11-02 DIAGNOSIS — E876 Hypokalemia: Secondary | ICD-10-CM | POA: Diagnosis present

## 2020-11-02 DIAGNOSIS — E119 Type 2 diabetes mellitus without complications: Secondary | ICD-10-CM

## 2020-11-02 DIAGNOSIS — Z803 Family history of malignant neoplasm of breast: Secondary | ICD-10-CM

## 2020-11-02 DIAGNOSIS — I5042 Chronic combined systolic (congestive) and diastolic (congestive) heart failure: Secondary | ICD-10-CM | POA: Diagnosis present

## 2020-11-02 DIAGNOSIS — F17210 Nicotine dependence, cigarettes, uncomplicated: Secondary | ICD-10-CM | POA: Diagnosis present

## 2020-11-02 DIAGNOSIS — R7989 Other specified abnormal findings of blood chemistry: Secondary | ICD-10-CM | POA: Diagnosis present

## 2020-11-02 DIAGNOSIS — Z7982 Long term (current) use of aspirin: Secondary | ICD-10-CM

## 2020-11-02 DIAGNOSIS — D45 Polycythemia vera: Secondary | ICD-10-CM | POA: Diagnosis present

## 2020-11-02 DIAGNOSIS — K5792 Diverticulitis of intestine, part unspecified, without perforation or abscess without bleeding: Secondary | ICD-10-CM

## 2020-11-02 DIAGNOSIS — Z8051 Family history of malignant neoplasm of kidney: Secondary | ICD-10-CM

## 2020-11-02 DIAGNOSIS — R0902 Hypoxemia: Secondary | ICD-10-CM | POA: Diagnosis not present

## 2020-11-02 DIAGNOSIS — Z833 Family history of diabetes mellitus: Secondary | ICD-10-CM

## 2020-11-02 DIAGNOSIS — Z8261 Family history of arthritis: Secondary | ICD-10-CM

## 2020-11-02 DIAGNOSIS — E872 Acidosis, unspecified: Secondary | ICD-10-CM | POA: Diagnosis not present

## 2020-11-02 LAB — DIFFERENTIAL
Abs Immature Granulocytes: 0.11 10*3/uL — ABNORMAL HIGH (ref 0.00–0.07)
Basophils Absolute: 0.1 10*3/uL (ref 0.0–0.1)
Basophils Relative: 1 %
Eosinophils Absolute: 0.1 10*3/uL (ref 0.0–0.5)
Eosinophils Relative: 1 %
Immature Granulocytes: 1 %
Lymphocytes Relative: 8 %
Lymphs Abs: 1.7 10*3/uL (ref 0.7–4.0)
Monocytes Absolute: 2.8 10*3/uL — ABNORMAL HIGH (ref 0.1–1.0)
Monocytes Relative: 13 %
Neutro Abs: 16.8 10*3/uL — ABNORMAL HIGH (ref 1.7–7.7)
Neutrophils Relative %: 76 %
Smear Review: NORMAL

## 2020-11-02 LAB — URINALYSIS, COMPLETE (UACMP) WITH MICROSCOPIC
Bacteria, UA: NONE SEEN
Bilirubin Urine: NEGATIVE
Glucose, UA: NEGATIVE mg/dL
Ketones, ur: NEGATIVE mg/dL
Leukocytes,Ua: NEGATIVE
Nitrite: NEGATIVE
Protein, ur: 100 mg/dL — AB
Specific Gravity, Urine: >1.046 — ABNORMAL HIGH (ref 1.005–1.030)
pH: 5 (ref 5.0–8.0)

## 2020-11-02 LAB — GASTROINTESTINAL PANEL BY PCR, STOOL (REPLACES STOOL CULTURE)

## 2020-11-02 LAB — CBC
HCT: 48.1 % — ABNORMAL HIGH (ref 36.0–46.0)
HCT: 41.3 % (ref 36.0–46.0)
Hemoglobin: 18 g/dL — ABNORMAL HIGH (ref 12.0–15.0)
Hemoglobin: 15 g/dL (ref 12.0–15.0)
MCH: 33.2 pg (ref 26.0–34.0)
MCH: 32.1 pg (ref 26.0–34.0)
MCHC: 37.4 g/dL — ABNORMAL HIGH (ref 30.0–36.0)
MCHC: 36.3 g/dL — ABNORMAL HIGH (ref 30.0–36.0)
MCV: 88.7 fL (ref 80.0–100.0)
MCV: 88.4 fL (ref 80.0–100.0)
Platelets: 216 10*3/uL (ref 150–400)
Platelets: 196 10*3/uL (ref 150–400)
RBC: 5.42 MIL/uL — ABNORMAL HIGH (ref 3.87–5.11)
RBC: 4.67 MIL/uL (ref 3.87–5.11)
RDW: 14 % (ref 11.5–15.5)
RDW: 14 % (ref 11.5–15.5)
WBC: 24.5 10*3/uL — ABNORMAL HIGH (ref 4.0–10.5)
WBC: 21.9 10*3/uL — ABNORMAL HIGH (ref 4.0–10.5)
nRBC: 0 % (ref 0.0–0.2)
nRBC: 0 % (ref 0.0–0.2)

## 2020-11-02 LAB — BASIC METABOLIC PANEL
Anion gap: 12 (ref 5–15)
BUN: 20 mg/dL (ref 8–23)
CO2: 21 mmol/L — ABNORMAL LOW (ref 22–32)
Calcium: 9.2 mg/dL (ref 8.9–10.3)
Chloride: 103 mmol/L (ref 98–111)
Creatinine, Ser: 1.51 mg/dL — ABNORMAL HIGH (ref 0.44–1.00)
GFR, Estimated: 39 mL/min — ABNORMAL LOW (ref >60)
Glucose, Bld: 222 mg/dL — ABNORMAL HIGH (ref 70–99)
Potassium: 3.4 mmol/L — ABNORMAL LOW (ref 3.5–5.1)
Sodium: 136 mmol/L (ref 135–145)

## 2020-11-02 LAB — HEPATIC FUNCTION PANEL
ALT: 77 U/L — ABNORMAL HIGH (ref 0–44)
AST: 64 U/L — ABNORMAL HIGH (ref 15–41)
Albumin: 3.1 g/dL — ABNORMAL LOW (ref 3.5–5.0)
Alkaline Phosphatase: 135 U/L — ABNORMAL HIGH (ref 38–126)
Bilirubin, Direct: 1.4 mg/dL — ABNORMAL HIGH (ref 0.0–0.2)
Indirect Bilirubin: 1.5 mg/dL — ABNORMAL HIGH (ref 0.3–0.9)
Total Bilirubin: 2.9 mg/dL — ABNORMAL HIGH (ref 0.3–1.2)
Total Protein: 8 g/dL (ref 6.5–8.1)

## 2020-11-02 LAB — TROPONIN I (HIGH SENSITIVITY)
Troponin I (High Sensitivity): 8 ng/L (ref <18)
Troponin I (High Sensitivity): 10 ng/L (ref <18)

## 2020-11-02 LAB — LACTIC ACID, PLASMA
Lactic Acid, Venous: 3.2 mmol/L (ref 0.5–1.9)
Lactic Acid, Venous: 2.2 mmol/L (ref 0.5–1.9)

## 2020-11-02 LAB — CBG MONITORING, ED: Glucose-Capillary: 122 mg/dL — ABNORMAL HIGH (ref 70–99)

## 2020-11-02 LAB — BRAIN NATRIURETIC PEPTIDE: B Natriuretic Peptide: 161.1 pg/mL — ABNORMAL HIGH (ref 0.0–100.0)

## 2020-11-02 LAB — LIPASE, BLOOD: Lipase: 17 U/L (ref 11–51)

## 2020-11-02 MED ORDER — IOHEXOL 350 MG/ML SOLN
75.0000 mL | Freq: Once | INTRAVENOUS | Status: AC | PRN
  Administered 2020-11-02: 75 mL via INTRAVENOUS

## 2020-11-02 MED ORDER — ACETAMINOPHEN 325 MG PO TABS
650.0000 mg | ORAL_TABLET | Freq: Four times a day (QID) | ORAL | Status: DC | PRN
  Administered 2020-11-02: 650 mg via ORAL
  Filled 2020-11-02: qty 2

## 2020-11-02 MED ORDER — INSULIN ASPART 100 UNIT/ML IJ SOLN
0.0000 [IU] | INTRAMUSCULAR | Status: DC
  Administered 2020-11-03: 2 [IU] via SUBCUTANEOUS
  Administered 2020-11-03: 3 [IU] via SUBCUTANEOUS
  Administered 2020-11-03: 2 [IU] via SUBCUTANEOUS
  Administered 2020-11-03: 3 [IU] via SUBCUTANEOUS
  Administered 2020-11-03 – 2020-11-04 (×2): 2 [IU] via SUBCUTANEOUS
  Administered 2020-11-04 (×2): 3 [IU] via SUBCUTANEOUS
  Administered 2020-11-04 – 2020-11-05 (×4): 2 [IU] via SUBCUTANEOUS
  Administered 2020-11-06: 3 [IU] via SUBCUTANEOUS
  Administered 2020-11-07: 2 [IU] via SUBCUTANEOUS
  Filled 2020-11-02 (×14): qty 1

## 2020-11-02 MED ORDER — ACETAMINOPHEN 650 MG RE SUPP: 650.0000 mg | Freq: Four times a day (QID) | RECTAL | Status: DC | PRN

## 2020-11-02 MED ORDER — VANCOMYCIN HCL 2000 MG/400ML IV SOLN
2000.0000 mg | Freq: Once | INTRAVENOUS | Status: AC
  Administered 2020-11-02: 2000 mg via INTRAVENOUS
  Filled 2020-11-02: qty 400

## 2020-11-02 MED ORDER — HYDROCODONE-ACETAMINOPHEN 5-325 MG PO TABS
1.0000 | ORAL_TABLET | ORAL | Status: DC | PRN
  Administered 2020-11-04: 2 via ORAL
  Administered 2020-11-04: 1 via ORAL
  Administered 2020-11-04: 2 via ORAL
  Administered 2020-11-04 – 2020-11-07 (×3): 1 via ORAL
  Filled 2020-11-02 (×4): qty 1
  Filled 2020-11-02 (×2): qty 2

## 2020-11-02 MED ORDER — LACTATED RINGERS IV SOLN: INTRAVENOUS | Status: DC

## 2020-11-02 MED ORDER — ONDANSETRON HCL 4 MG PO TABS: 4.0000 mg | ORAL_TABLET | Freq: Four times a day (QID) | ORAL | Status: DC | PRN

## 2020-11-02 MED ORDER — SODIUM CHLORIDE 0.9 % IV BOLUS
1000.0000 mL | Freq: Once | INTRAVENOUS | Status: AC
  Administered 2020-11-02: 1000 mL via INTRAVENOUS

## 2020-11-02 MED ORDER — ONDANSETRON HCL 4 MG/2ML IJ SOLN: 4.0000 mg | Freq: Four times a day (QID) | INTRAMUSCULAR | Status: DC | PRN

## 2020-11-02 MED ORDER — PIPERACILLIN-TAZOBACTAM 3.375 G IVPB 30 MIN
3.3750 g | Freq: Once | INTRAVENOUS | Status: AC
  Administered 2020-11-02: 3.375 g via INTRAVENOUS
  Filled 2020-11-02: qty 50

## 2020-11-02 MED ORDER — MORPHINE SULFATE (PF) 2 MG/ML IV SOLN
2.0000 mg | INTRAVENOUS | Status: DC | PRN
Start: 2020-11-02 — End: 2020-11-08
  Administered 2020-11-02 – 2020-11-03 (×2): 2 mg via INTRAVENOUS
  Filled 2020-11-02 (×2): qty 1

## 2020-11-02 MED ORDER — PIPERACILLIN-TAZOBACTAM 4.5 G IVPB
3.3750 g | Freq: Once | INTRAVENOUS | Status: DC
  Filled 2020-11-02: qty 100

## 2020-11-02 MED ORDER — GADOBUTROL 1 MMOL/ML IV SOLN
10.0000 mL | Freq: Once | INTRAVENOUS | Status: AC | PRN
  Administered 2020-11-02: 10 mL via INTRAVENOUS

## 2020-11-02 MED ORDER — PIPERACILLIN-TAZOBACTAM 3.375 G IVPB
3.3750 g | Freq: Three times a day (TID) | INTRAVENOUS | Status: AC
  Administered 2020-11-02 – 2020-11-05 (×10): 3.375 g via INTRAVENOUS
  Filled 2020-11-02 (×10): qty 50

## 2020-11-02 NOTE — H&P (Signed)
History and Physical    Stacy Cox QQV:[id number] DOB: 08/17/1955 DOA: 11/02/2020  PCP: Donnie Coffin, MD   Patient coming from: home  I have personally briefly reviewed patient's old medical records in [place]  Chief Complaint: Shortness of breath and tachycardia  HPI: Stacy Cox is a 65 y.o. female with medical history significant for DM, HTN, CHF (EF 55 to 60% 2013), COPD, nicotine dependence and polycythemia vera presenting initially for complaints of shortness of breath and palpitations.  She reports a 1 week history of cough, fever, shortness of breath, right lower quadrant abdominal pain as well as nonbloody nonbilious diarrhea, 3-4 episodes a day.  Urinary frequency without dysuria.  Decreased appetite and generalized malaise.  Denies chest pain.  Has a mild headache.  No palpitations or lightheadedness  ED course: On arrival afebrile, BP 115/69, pulse 98 with O2 sat 96% on room air.  Became tachycardic to 130 and tachypneic to 28 while in the ED. Blood work significant for WBC of 24,000 lactic acid 3.2.  Hemoglobin 18.  Creatinine 1.5 but no recent creatinine on record.  Blood sugar 222.  Elevated LFTs with AST/ALT of 64/77 with alk phos of 135 and total bilirubin of 2.9 with direct 1.4 and indirect 1.5  EKG, personally viewed and interpreted: Sinus tachycardia with prolonged QT.  No acute ST-T wave changes  Imaging: CT abdomen and pelvis with contrast as well as CTA chest significant for no acute PE or cardiopulmonary disease but liver lesions concerning for infection/abscess as well as possible acute diverticulitis with abscess.  Please refer to detailed report  The ED provider sought consultation from surgeon Dr. Dahlia Byes who recommended admission to hospitalist service with surgery consult and for acute diverticulitis and IR consult for liver lesion.  Patient started on Zosyn and IV fluids.  Hospitalist consulted for admission.  Review of Systems: As per HPI otherwise  all other systems on review of systems negative.    Past Medical History:  Diagnosis Date   CHF (congestive heart failure) (HCC)    COPD (chronic obstructive pulmonary disease) (HCC)    Diabetes mellitus, type 2 (HCC)    GERD (gastroesophageal reflux disease)    Heart disease    Hypertension    Joint pain    Polycythemia vera ([place])    Tobacco abuse     History reviewed. No pertinent surgical history.   reports that she has been smoking cigarettes. She has a 10.00 pack-year smoking history. She has never used smokeless tobacco. She reports that she does not drink alcohol and does not use drugs.  No Known Allergies  Family History  Problem Relation Age of Onset   Breast cancer Sister        sister #1 age 20   Kidney cancer Sister        sister #1 age -50's   Breast cancer Sister        sister #2 age 17's   Kidney disease Sister        sister #2 age 31's   Diabetes Mother    Heart disease Mother        + BYPASS SURGERY   Deep vein thrombosis Mother    Arthritis Mother    Diabetes Sister    Heart disease Sister    Hypertension Brother    Heart attack Father       Prior to Admission medications   Medication Sig Start Date End Date Taking? Authorizing Provider  aspirin 325 MG EC  tablet Take 325 mg by mouth daily.    [provider]  b complex vitamins tablet Take 1 tablet by mouth every other day.     [provider]  COREG CR 80 MG 24 hr capsule Take 1 tablet by mouth daily. 11/20/14   [provider]  furosemide (LASIX) 40 MG tablet Take 1 tablet by mouth daily. 11/19/14   [provider]  gabapentin (NEURONTIN) 100 MG capsule Take 100-200 mg by mouth 3 (three) times daily. 10/20/20   [provider]  lactobacillus acidophilus & bulgar (LACTINEX) chewable tablet Chew 1 tablet by mouth 3 (three) times daily with meals. 05/03/15   Bettey Costa, MD  losartan (COZAAR) 25 MG tablet Take 1 tablet by mouth daily. 12/03/14   [provider]  metFORMIN (GLUCOPHAGE) 500 MG tablet Take 1 tablet by mouth daily with breakfast. 11/19/14   [provider]  Multiple Vitamins-Minerals (MULTIVITAL PO) Take 1 tablet by mouth daily.    [provider]  pantoprazole (PROTONIX) 40 MG tablet Take 1 tablet by mouth daily. 11/19/14   [provider]  potassium chloride SA (K-DUR,KLOR-CON) 20 MEQ tablet Take 1 tablet by mouth daily. 11/20/14   [provider]  spironolactone (ALDACTONE) 25 MG tablet Take 1 tablet by mouth daily. 11/19/14   [provider]    Physical Exam: Vitals:   11/02/20 1217 11/02/20 1516 11/02/20 1733 11/02/20 1829  BP: 112/76 115/69 119/87 116/81  Pulse: (!) 138 98 99 98  Resp: 20 (!) '21 17 17  ' Temp: 97.7 F (36.5 C) 98.7 F (37.1 C) 98.8 F (37.1 C)   TempSrc:   Oral   SpO2: 100% 96% 96% 98%     Vitals:   11/02/20 1217 11/02/20 1516 11/02/20 1733 11/02/20 1829  BP: 112/76 115/69 119/87 116/81  Pulse: (!) 138 98 99 98  Resp: 20 (!) '21 17 17  ' Temp: 97.7 F (36.5 C) 98.7 F (37.1 C) 98.8 F (37.1 C)   TempSrc:   Oral   SpO2: 100% 96% 96% 98%      Constitutional: Alert and oriented x 3 . Not in any apparent distress HEENT:      Head: Normocephalic and atraumatic.         Eyes: PERLA, EOMI, Conjunctivae are normal. Sclera is non-icteric.       Mouth/Throat: Mucous membranes are moist.       Neck: Supple with no signs of meningismus. Cardiovascular: Regular rate and rhythm. No murmurs, gallops, or rubs. 2+ symmetrical distal pulses are present . No JVD. No LE edema Respiratory: Respiratory effort normal .Lungs sounds clear bilaterally. No wheezes, crackles, or rhonchi.  Gastrointestinal: Soft, non tender, and non distended with positive bowel sounds.  Genitourinary: No CVA tenderness. Musculoskeletal: Nontender with normal range of motion in all extremities. No cyanosis, or erythema of extremities. Neurologic:  Face is symmetric. Moving all  extremities. No gross focal neurologic deficits . Skin: Skin is warm, dry.  No rash or ulcers Psychiatric: Mood and affect are normal    Labs on Admission: I have personally reviewed following labs and imaging studies  CBC: Recent Labs  Lab 11/02/20 1219  WBC 24.5*  HGB 18.0*  HCT 48.1*  MCV 88.7  PLT 643   Basic Metabolic Panel: Recent Labs  Lab 11/02/20 1219  NA 136  K 3.4*  CL 103  CO2 21*  GLUCOSE 222*  BUN 20  CREATININE 1.51*  CALCIUM 9.2   GFR: CrCl cannot be  calculated (Unknown ideal weight.). Liver Function Tests: Recent Labs  Lab 11/02/20 1219  AST 64*  ALT 77*  ALKPHOS 135*  BILITOT 2.9*  PROT 8.0  ALBUMIN 3.1*   Recent Labs  Lab 11/02/20 1219  LIPASE 17   No results for input(s): AMMONIA in the last 168 hours. Coagulation Profile: No results for input(s): INR, PROTIME in the last 168 hours. Cardiac Enzymes: No results for input(s): CKTOTAL, CKMB, CKMBINDEX, TROPONINI in the last 168 hours. BNP (last 3 results) No results for input(s): PROBNP in the last 8760 hours. HbA1C: No results for input(s): HGBA1C in the last 72 hours. CBG: No results for input(s): GLUCAP in the last 168 hours. Lipid Profile: No results for input(s): CHOL, HDL, LDLCALC, TRIG, CHOLHDL, LDLDIRECT in the last 72 hours. Thyroid Function Tests: No results for input(s): TSH, T4TOTAL, FREET4, T3FREE, THYROIDAB in the last 72 hours. Anemia Panel: No results for input(s): VITAMINB12, FOLATE, FERRITIN, TIBC, IRON, RETICCTPCT in the last 72 hours. Urine analysis:    Component Value Date/Time   COLORURINE AMBER (A) 11/02/2020 1323   APPEARANCEUR HAZY (A) 11/02/2020 1323   APPEARANCEUR Clear 10/30/2011 1944   LABSPEC >1.046 (H) 11/02/2020 1323   LABSPEC 1.001 10/30/2011 1944   PHURINE 5.0 11/02/2020 1323   GLUCOSEU NEGATIVE 11/02/2020 1323   GLUCOSEU Negative 10/30/2011 1944   HGBUR SMALL (A) 11/02/2020 1323   BILIRUBINUR NEGATIVE 11/02/2020 1323   BILIRUBINUR  Negative 10/30/2011 1944   KETONESUR NEGATIVE 11/02/2020 1323   PROTEINUR 100 (A) 11/02/2020 1323   NITRITE NEGATIVE 11/02/2020 1323   LEUKOCYTESUR NEGATIVE 11/02/2020 1323   LEUKOCYTESUR Negative 10/30/2011 1944    Radiological Exams on Admission: DG Chest 2 View  Result Date: 11/02/2020 CLINICAL DATA:  Shortness of breath EXAM: CHEST - 2 VIEW COMPARISON:  May 02, 2015 FINDINGS: The heart, hila, and mediastinum are normal. No pneumothorax. No nodules or masses. No focal infiltrates or overt edema. IMPRESSION: No active cardiopulmonary disease. Electronically Signed   By: Dorise Bullion III M.D.   On: 11/02/2020 13:02   CT Angio Chest PE W and/or Wo Contrast  Result Date: 11/02/2020 CLINICAL DATA:  Tachycardia. Abdominal pain. Suspect pulmonary embolism. EXAM: CT ANGIOGRAPHY CHEST CT ABDOMEN AND PELVIS WITH CONTRAST TECHNIQUE: Multidetector CT imaging of the chest was performed using the standard protocol during bolus administration of intravenous contrast. Multiplanar CT image reconstructions and MIPs were obtained to evaluate the vascular anatomy. Multidetector CT imaging of the abdomen and pelvis was performed using the standard protocol during bolus administration of intravenous contrast. CONTRAST:  47m OMNIPAQUE IOHEXOL 350 MG/ML SOLN COMPARISON:  CT abdomen/pelvis 05/02/2015 and CT chest 02/14/2008 FINDINGS: CTA CHEST FINDINGS Cardiovascular: Heart is normal size. Calcified plaque over the left main and 3 vessel coronary arteries. Thoracic aorta is normal in caliber. Pulmonary arterial system is well opacified without evidence of emboli. Mediastinum/Nodes: No mediastinal or hilar adenopathy. 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 Lungs/Pleura: Lungs are well inflated without focal airspace consolidation or effusion. Airways are normal. Musculoskeletal: Mild degenerative change of the spine. Review of the MIP images confirms the above findings. CT ABDOMEN and  PELVIS FINDINGS Hepatobiliary: There are 2 adjacent masslike areas of low attenuation over the right lobe of the liver measuring 7.8 x 9.2 cm and more inferiorly over the right lobe measuring 5.5 x 8.8 cm. Gallbladder is somewhat contracted. Biliary tree is normal. Pancreas: Normal. Spleen: Normal. Adrenals/Urinary Tract: Right adrenal gland is normal. 3.1 cm left adrenal mass without  significant change. Kidneys are normal in size without hydronephrosis or nephrolithiasis. Couple subcentimeter left renal cortical hypodensities too small to characterize but likely cysts. Ureters and bladder are normal. Stomach/Bowel: Stomach and small bowel are normal. Appendix is normal. Minimal diverticulosis of the colon. There is a rounded 1.8 cm hypodensity containing minimal fluid and air which abuts the superolateral aspect of the adjacent sigmoid colon and may represent evidence of diverticulitis with small diverticular abscess. This structure also abuts the anterior aspect of the adjacent left ovary. Vascular/Lymphatic: Abdominal aorta is normal caliber with mild calcified plaque present. No adenopathy. Reproductive: Uterus and right ovary are normal. Left ovary likely normal although does abut an indeterminate structure as described above. Other: None. Musculoskeletal: No focal abnormality. IMPRESSION: 1. No evidence of pulmonary embolism and no acute cardiopulmonary disease. 2. Two adjacent masslike areas of low attenuation over the right lobe of the liver with the larger measuring 7.8 x 9.2 cm and more inferiorly over the right lobe measuring 5.5 x 8.8 cm. Findings may be due to infection/abscess in light of possible diverticular abscess as described below. Metastatic disease is also a concern as well as possible primary liver neoplasm. 3. There is an indeterminate 1.8 cm hypodensity containing minimal fluid and air which abuts the superolateral aspect of the adjacent sigmoid colon and may represent evidence of  diverticulitis with small diverticular abscess. 4. Aortic atherosclerosis. Atherosclerotic coronary artery disease. 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 Recommend thyroid ultrasound for further evaluation. (Ref: J Am Coll Radiol. 2015 Feb;12(2): 143-50). 6. 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 Few subcentimeter left renal cortical hypodensities, too small to characterize, but likely cysts. Aortic Atherosclerosis (ICD10-I70.0). Electronically Signed   By: Marin Olp M.D.   On: 11/02/2020 15:01   CT ABDOMEN PELVIS W CONTRAST  Result Date: 11/02/2020 CLINICAL DATA:  Tachycardia. Abdominal pain. Suspect pulmonary embolism. EXAM: CT ANGIOGRAPHY CHEST CT ABDOMEN AND PELVIS WITH CONTRAST TECHNIQUE: Multidetector CT imaging of the chest was performed using the standard protocol during bolus administration of intravenous contrast. Multiplanar CT image reconstructions and MIPs were obtained to evaluate the vascular anatomy. Multidetector CT imaging of the abdomen and pelvis was performed using the standard protocol during bolus administration of intravenous contrast. CONTRAST:  62m OMNIPAQUE IOHEXOL 350 MG/ML SOLN COMPARISON:  CT abdomen/pelvis 05/02/2015 and CT chest 02/14/2008 FINDINGS: CTA CHEST FINDINGS Cardiovascular: Heart is normal size. Calcified plaque over the left main and 3 vessel coronary arteries. Thoracic aorta is normal in caliber. Pulmonary arterial system is well opacified without evidence of emboli. Mediastinum/Nodes: No mediastinal or hilar adenopathy. 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 Lungs/Pleura: Lungs are well inflated without focal airspace consolidation or effusion. Airways are normal. Musculoskeletal: Mild degenerative change of the spine. Review of the MIP images confirms the above findings. CT ABDOMEN and PELVIS FINDINGS Hepatobiliary: There are 2 adjacent masslike areas of low attenuation over the right lobe of the liver  measuring 7.8 x 9.2 cm and more inferiorly over the right lobe measuring 5.5 x 8.8 cm. Gallbladder is somewhat contracted. Biliary tree is normal. Pancreas: Normal. Spleen: Normal. Adrenals/Urinary Tract: Right adrenal gland is normal. 3.1 cm left adrenal mass without significant change. Kidneys are normal in size without hydronephrosis or nephrolithiasis. Couple subcentimeter left renal cortical hypodensities too small to characterize but likely cysts. Ureters and bladder are normal. Stomach/Bowel: Stomach and small bowel are normal. Appendix is normal. Minimal diverticulosis of the colon. There is a  rounded 1.8 cm hypodensity containing minimal fluid and air which abuts the superolateral aspect of the adjacent sigmoid colon and may represent evidence of diverticulitis with small diverticular abscess. This structure also abuts the anterior aspect of the adjacent left ovary. Vascular/Lymphatic: Abdominal aorta is normal caliber with mild calcified plaque present. No adenopathy. Reproductive: Uterus and right ovary are normal. Left ovary likely normal although does abut an indeterminate structure as described above. Other: None. Musculoskeletal: No focal abnormality. IMPRESSION: 1. No evidence of pulmonary embolism and no acute cardiopulmonary disease. 2. Two adjacent masslike areas of low attenuation over the right lobe of the liver with the larger measuring 7.8 x 9.2 cm and more inferiorly over the right lobe measuring 5.5 x 8.8 cm. Findings may be due to infection/abscess in light of possible diverticular abscess as described below. Metastatic disease is also a concern as well as possible primary liver neoplasm. 3. There is an indeterminate 1.8 cm hypodensity containing minimal fluid and air which abuts the superolateral aspect of the adjacent sigmoid colon and may represent evidence of diverticulitis with small diverticular abscess. 4. Aortic atherosclerosis. Atherosclerotic coronary artery disease. 5. 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 Recommend thyroid ultrasound for further evaluation. (Ref: J Am Coll Radiol. 2015 Feb;12(2): 143-50). 6. 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 Few subcentimeter left renal cortical hypodensities, too small to characterize, but likely cysts. Aortic Atherosclerosis (ICD10-I70.0). Electronically Signed   By: Marin Olp M.D.   On: 11/02/2020 15:01     Assessment/Plan 65 year old female with history of DM, HTN, CHF (EF 55 to 60% 2013), COPD, nicotine dependence and polycythemia vera presenting initially for complaints of shortness of breath and palpitations, fever and abdominal pain.       Sepsis secondary to acute diverticulitis   Liver abscess/ abnormality -Patient presents with fever, tachycardia and shortness of breath, leukocytosis of 24,000 with lactic acid 3.2.  Diverticulitis and liver abscess on CT - CT abdomen and pelvis showing diverticulitis/liver abscess/possible malignancy with liver primary-please see detailed report - IV fluids, pain control, IV antiemetics - Zosyn - Surgical consult for comanagement of acute diverticulitis - IR consult for ultrasound-guided drainage of abscess - We will keep n.p.o. for possible procedures in the a.m. - SCD for DVT prophylaxis    Type 2 diabetes mellitus - Sliding scale insulin coverage    CHF (congestive heart failure) (HCC) - Appears euvolemic - Given sepsis we will hold furosemide and spironolactone for now - Continue losartan and Coreg - Last echocardiogram on record was 2013 showing EF 55 to 60%    Polycythemia vera ([place]) - Hemoglobin 18    Tobacco abuse - Nicotine patch    COPD (chronic obstructive pulmonary disease) ([place]) - Not acutely exacerbated - DuoNebs as needed    DVT prophylaxis: SCDs for possible aspiration by IR Code Status: full code  Family Communication:  none  Disposition Plan: Back to previous home environment Consults called: Surgery and IR Status:At the  time of admission, it appears that the appropriate admission status for this patient is INPATIENT. This is judged to be reasonable and necessary in order to provide the required intensity of service to ensure the patient's safety given the presenting symptoms, physical exam findings, and initial radiographic and laboratory data in the context of their  Comorbid conditions.   Patient requires inpatient status due to high intensity of service, high risk for further deterioration and high frequency of surveillance required.   I certify that at  the point of admission it is my clinical judgment that the patient will require inpatient hospital care spanning beyond [place] MD [facility]     11/02/2020, 7:49 PM

## 2020-11-02 NOTE — ED Provider Notes (Signed)
Emergency Medicine Provider Triage Evaluation Note  Stacy Cox , a 65 y.o. female  was evaluated in triage.  Pt complains of fever, headache, lightheadedness, sore throat, cough and shortness of breath.  This started 1 week ago.  Cough is nonproductive.  [facility]  Review of Systems  Positive: Fever, headache, lightheadedness, sore throat, cough and shortness of breath Negative: Chills, body aches, vision changes, runny nose, nasal congestion, chest pain, nausea, vomiting or diarrhea  Physical Exam  BP 112/76   Pulse (!) 138   Temp 97.7 F (36.5 C)   Resp 20   SpO2 100%  Gen:   Awake, appears uncomfortable Resp:  Normal effort, wearing oxygen Neuro:  Alert and oriented  Medical Decision Making  Medically screening exam initiated at 12:24 PM.  Appropriate orders placed.  Stacy Cox was informed that the remainder of the evaluation will be completed by another provider, this initial triage assessment does not replace that evaluation, and the importance of remaining in the ED until their evaluation is complete.     Stacy Fenton, NP 11/02/20 1225    Stacy Starch, MD 11/02/20 [phone]

## 2020-11-02 NOTE — Consult Note (Signed)
Patient ID: Stacy Cox, female   DOB: [birth year], 65 y.o.   MRN: [id number]  HPI Stacy Cox is a 65 y.o. female seen in consultation at the request of Dr. Ellender Hose.  She reports a 1 week history of intermittent abdominal pain that is sharp moderate intensity.  No specific alleviating or aggravating factors.  She does report some chills and fevers.  Reports some shortness of their breath and dyspnea on exertion.  Does have a diagnosis of CHF.  SHe did have a CT scan of the abdomen pelvis that have personally reviewed showing evidence of 2 liver lesions consistent with liver abscess as well as diverticulitis on the sigmoid colon. He also reported some loose bowel movements as well as decreased appetite and some nausea.  White count of 24.5 hemoglobin of 18 and platelet count of 216,000.  LFTs show a total bilirubin of 2.9 and is a split evenly between the direct and indirect.  AST and ALT as well as alkaline phosphatase mildly elevated. He has had prior episode of acute diverticulitis that required hospitalization. Does have dyspnea on exertion but is able to do all the activities of daily living significant dyspnea Does have a history of polycythemia.  She smokes daily  HPI  Past Medical History:  Diagnosis Date   CHF (congestive heart failure) (HCC)    COPD (chronic obstructive pulmonary disease) (HCC)    Diabetes mellitus, type 2 (HCC)    GERD (gastroesophageal reflux disease)    Heart disease    Hypertension    Joint pain    Polycythemia vera ([place])    Tobacco abuse     History reviewed. No pertinent surgical history.  Family History  Problem Relation Age of Onset   Breast cancer Sister        sister #1 age 30   Kidney cancer Sister        sister #1 age -51's   Breast cancer Sister        sister #2 age 33's   Kidney disease Sister        sister #2 age 59's   Diabetes Mother    Heart disease Mother        + BYPASS SURGERY   Deep vein thrombosis Mother    Arthritis Mother     Diabetes Sister    Heart disease Sister    Hypertension Brother    Heart attack Father     Social History Social History   Tobacco Use   Smoking status: Every Day    Packs/day: 0.25    Years: 40.00    Pack years: 10.00    Types: Cigarettes   Smokeless tobacco: Never   Tobacco comments:    smoking since teens  Substance Use Topics   Alcohol use: No    Alcohol/week: 0.0 standard drinks   Drug use: No    No Known Allergies  Current Facility-Administered Medications  Medication Dose Route Frequency Provider Last Rate Last Admin   vancomycin (VANCOREADY) IVPB 2000 mg/400 mL  2,000 mg Intravenous Once Rada Hay, MD 200 mL/hr at 11/02/20 1521 2,000 mg at 11/02/20 1521   Current Outpatient Medications  Medication Sig Dispense Refill   aspirin 325 MG EC tablet Take 325 mg by mouth daily.     b complex vitamins tablet Take 1 tablet by mouth every other day.      COREG CR 80 MG 24 hr capsule Take 1 tablet by mouth daily.     furosemide (LASIX) 40 MG  tablet Take 1 tablet by mouth daily.     gabapentin (NEURONTIN) 100 MG capsule Take 100-200 mg by mouth 3 (three) times daily.     lactobacillus acidophilus & bulgar (LACTINEX) chewable tablet Chew 1 tablet by mouth 3 (three) times daily with meals. 60 tablet 0   losartan (COZAAR) 25 MG tablet Take 1 tablet by mouth daily.     metFORMIN (GLUCOPHAGE) 500 MG tablet Take 1 tablet by mouth daily with breakfast.     Multiple Vitamins-Minerals (MULTIVITAL PO) Take 1 tablet by mouth daily.     pantoprazole (PROTONIX) 40 MG tablet Take 1 tablet by mouth daily.     potassium chloride SA (K-DUR,KLOR-CON) 20 MEQ tablet Take 1 tablet by mouth daily.     spironolactone (ALDACTONE) 25 MG tablet Take 1 tablet by mouth daily.       Review of Systems Full ROS  was asked and was negative except for the information on the HPI  Physical Exam Blood pressure 115/69, pulse 98, temperature 98.7 F (37.1 C), resp. rate (!) 21, SpO2 96  %. CONSTITUTIONAL: NAD EYES: Pupils are equal, round, Sclera are non-icteric. EARS, NOSE, MOUTH AND THROAT: The oropharynx is clear. The oral mucosa is pink and moist. Hearing is intact to voice. LYMPH NODES:  Lymph nodes in the neck are normal. RESPIRATORY:  Lungs are clear. There is normal respiratory effort, with equal breath sounds bilaterally, and without pathologic use of accessory muscles. CARDIOVASCULAR: Heart is regular without murmurs, gallops, or rubs. GI: The abdomen is soft, tender to palpation in the left lower quadrant without peritonitis or guarding . There are no palpable masses. There is no hepatosplenomegaly. There are normal bowel sounds  GU: Rectal deferred.   MUSCULOSKELETAL: Normal muscle strength and tone. No cyanosis or edema.   SKIN: Turgor is good and there are no pathologic skin lesions or ulcers. NEUROLOGIC: Motor and sensation is grossly normal. Cranial nerves are grossly intact. PSYCH:  Oriented to person, place and time. Affect is normal.  Data Reviewed  I have personally reviewed the patient's imaging, laboratory findings and medical records.    Assessment/Plan Acute diverticulitis and liver abscess.  Diverticulitis likely the culprit.  Recommend admission to the hospital with IV antibiotics and placement of drain by interventional radiology. It would  Be also useful to obtain an MRI to further differentiate this but the clinical picture is consistent with abscess and acute diverticulitis.  At this time no need for emergent surgical intervention.  We will continue to follow.  During her diverticulitis and long-term plan I do think that this will need to be tailored to her individual needs.  She seems to have prior episodes of diverticulitis and now with complications.  We will continue to follow her while she is in the hospital.   Caroleen Hamman, MD [facility] General Surgeon 11/02/2020, 4:44 PM

## 2020-11-02 NOTE — ED Notes (Signed)
Pt is currently a&ox4 curled up in bed shivering with a rectal temp of 103.7; pt given tylenol 650.

## 2020-11-02 NOTE — ED Provider Notes (Addendum)
Cussed with  [facility]  ____________________________________________   Event Date/Time   First MD Initiated Contact with Patient 11/02/20 1300     (approximate)  I have reviewed the triage vital signs and the nursing notes.   HISTORY  Chief Complaint Tachycardia    HPI Stacy Cox is a 65 y.o. female past medical history of COPD, CHF, diabetes, GERD, hypertension, hyperlipidemia, polycythemia vera who presents with fevers, shortness of breath cough etc.  Patient notes that all of her symptoms started about 1 week ago.  It started with hot and cold flashes and fevers, she then developed cough shortness of breath and lightheadedness.  She has had nonbloody nonbilious diarrhea 3-4 episodes daily, as well as some right lower quadrant abdominal pain.  She notes that the abdominal pain is not terrible but she does notice it.  Has had urinary frequency but no dysuria.  Denies any back pain.  Has had decreased appetite during this time.  He notes a mild headache, no purulent nasal discharge or facial pain.  He denies any history of valve repair or prosthetic material in her body.  Patient went to urgent care today and had a negative COVID test.  She was sent to the ED.         Past Medical History:  Diagnosis Date   CHF (congestive heart failure) (HCC)    COPD (chronic obstructive pulmonary disease) (HCC)    Diabetes mellitus, type 2 (HCC)    GERD (gastroesophageal reflux disease)    Heart disease    Hypertension    Joint pain    Polycythemia vera ([place])    Tobacco abuse     Patient Active Problem List   Diagnosis Date Noted   Acute diverticulitis 05/02/2015   Sepsis secondary to acute diverticulitis 05/02/2015   Tobacco abuse 05/02/2015   Type 2 diabetes mellitus  05/02/2015   Polycythemia vera ([place]) 12/13/2014    History reviewed. No pertinent surgical history.  Prior to Admission medications   Medication Sig Start Date End Date Taking?  Authorizing Provider  acetaminophen-codeine (TYLENOL #2) 300-15 MG tablet Take 1 tablet by mouth every 4 (four) hours as needed for moderate pain. 05/03/15   Bettey Costa, MD  aspirin 325 MG EC tablet Take 325 mg by mouth daily.    [provider]  b complex vitamins tablet Take 1 tablet by mouth every other day.     [provider]  ciprofloxacin (CIPRO) 500 MG tablet Take 1 tablet (500 mg total) by mouth 2 (two) times daily. 05/03/15   Bettey Costa, MD  COREG CR 80 MG 24 hr capsule Take 1 tablet by mouth daily. 11/20/14   [provider]  furosemide (LASIX) 40 MG tablet Take 1 tablet by mouth daily. 11/19/14   [provider]  lactobacillus acidophilus & bulgar (LACTINEX) chewable tablet Chew 1 tablet by mouth 3 (three) times daily with meals. 05/03/15   Bettey Costa, MD  losartan (COZAAR) 25 MG tablet Take 1 tablet by mouth daily. 12/03/14   [provider]  meloxicam (MOBIC) 7.5 MG tablet Take 1 tablet by mouth daily. 11/19/14   [provider]  metFORMIN (GLUCOPHAGE) 500 MG tablet Take 1 tablet by mouth daily with breakfast. 11/19/14   [provider]  metroNIDAZOLE (FLAGYL) 500 MG tablet Take 1 tablet (500 mg total) by mouth 3 (three) times daily. 05/03/15   Bettey Costa, MD  Multiple Vitamins-Minerals (MULTIVITAL PO) Take 1 tablet by mouth daily.  [provider]  nicotine (NICODERM CQ - DOSED IN MG/24 HOURS) 14 mg/24hr patch Place 1 patch (14 mg total) onto the skin daily. 05/03/15   Bettey Costa, MD  pantoprazole (PROTONIX) 40 MG tablet Take 1 tablet by mouth daily. 11/19/14   [provider]  potassium chloride SA (K-DUR,KLOR-CON) 20 MEQ tablet Take 1 tablet by mouth daily. 11/20/14   [provider]  spironolactone (ALDACTONE) 25 MG tablet Take 1 tablet by mouth daily. 11/19/14   [provider]    Allergies Patient has no known allergies.  Family History  Problem Relation Age of Onset   Breast  cancer Sister        sister #1 age 22   Kidney cancer Sister        sister #1 age -68's   Breast cancer Sister        sister #2 age 40's   Kidney disease Sister        sister #2 age 25's   Diabetes Mother    Heart disease Mother        + BYPASS SURGERY   Deep vein thrombosis Mother    Arthritis Mother    Diabetes Sister    Heart disease Sister    Hypertension Brother    Heart attack Father     Social History Social History   Tobacco Use   Smoking status: Every Day    Packs/day: 0.25    Years: 40.00    Pack years: 10.00    Types: Cigarettes   Smokeless tobacco: Never   Tobacco comments:    smoking since teens  Substance Use Topics   Alcohol use: No    Alcohol/week: 0.0 standard drinks   Drug use: No    Review of Systems   Review of Systems  Constitutional:  Positive for appetite change, chills, fatigue and fever.  Respiratory:  Positive for cough and shortness of breath. Negative for chest tightness.   Cardiovascular:  Negative for leg swelling.  Gastrointestinal:  Positive for abdominal pain, diarrhea and nausea. Negative for blood in stool, constipation and vomiting.  Endocrine: Positive for polyuria.  Genitourinary:  Positive for frequency. Negative for dysuria.  Neurological:  Positive for headaches.  All other systems reviewed and are negative.  Physical Exam Updated Vital Signs BP 115/69 (BP Location: Right Arm)   Pulse 98   Temp 98.7 F (37.1 C)   Resp (!) 21   SpO2 96%   Physical Exam Vitals and nursing note reviewed.  Constitutional:      General: She is not in acute distress.    Appearance: Normal appearance. She is diaphoretic.  HENT:     Head: Normocephalic and atraumatic.  Eyes:     General: No scleral icterus.    Conjunctiva/sclera: Conjunctivae normal.  Cardiovascular:     Rate and Rhythm: Regular rhythm. Tachycardia present.  Pulmonary:     Effort: Pulmonary effort is normal. No respiratory distress.     Breath sounds: Normal  breath sounds. No stridor.  Abdominal:     General: Abdomen is flat. There is no distension.     Palpations: Abdomen is soft.     Tenderness: There is abdominal tenderness.     Comments: Tenderness to palpation in epigastrium, right upper quadrant and right lower quadrant  Musculoskeletal:        General: Tenderness present. No deformity or signs of injury.     Cervical back: Normal range of motion.     Comments: Left calf  with tenderness to palpation, no obvious swelling or deformity or erythema  Skin:    Coloration: Skin is not jaundiced or pale.  Neurological:     General: No focal deficit present.     Mental Status: She is alert and oriented to person, place, and time. Mental status is at baseline.  Psychiatric:        Mood and Affect: Mood normal.        Behavior: Behavior normal.     LABS (all labs ordered are listed, but only abnormal results are displayed)  Labs Reviewed  BASIC METABOLIC PANEL - Abnormal; Notable for the following components:      Result Value   Potassium 3.4 (*)    CO2 21 (*)    Glucose, Bld 222 (*)    Creatinine, Ser 1.51 (*)    GFR, Estimated 39 (*)    All other components within normal limits  CBC - Abnormal; Notable for the following components:   WBC 24.5 (*)    RBC 5.42 (*)    Hemoglobin 18.0 (*)    HCT 48.1 (*)    MCHC 37.4 (*)    All other components within normal limits  BRAIN NATRIURETIC PEPTIDE - Abnormal; Notable for the following components:   B Natriuretic Peptide 161.1 (*)    All other components within normal limits  LACTIC ACID, PLASMA - Abnormal; Notable for the following components:   Lactic Acid, Venous 3.2 (*)    All other components within normal limits  HEPATIC FUNCTION PANEL - Abnormal; Notable for the following components:   Albumin 3.1 (*)    AST 64 (*)    ALT 77 (*)    Alkaline Phosphatase 135 (*)    Total Bilirubin 2.9 (*)    Bilirubin, Direct 1.4 (*)    Indirect Bilirubin 1.5 (*)    All other components  within normal limits  URINE CULTURE  CULTURE, BLOOD (ROUTINE X 2)  CULTURE, BLOOD (ROUTINE X 2)  LIPASE, BLOOD  URINALYSIS, COMPLETE (UACMP) WITH MICROSCOPIC  DIFFERENTIAL  TROPONIN I (HIGH SENSITIVITY)  TROPONIN I (HIGH SENSITIVITY)   ____________________________________________  EKG  Sinus tachycardia prolonged QT interval, normal axis, no acute ischemic changes ____________________________________________  RADIOLOGY Almeta Monas, personally viewed and evaluated these images (plain radiographs) as part of my medical decision making, as well as reviewing the written report by the radiologist.  ED MD interpretation:  I reviewed the CXR which does not show any acute cardiopulmonary process      ____________________________________________   PROCEDURES  Procedure(s) performed (including Critical Care):  .1-3 Lead EKG Interpretation Performed by: Rada Hay, MD Authorized by: Rada Hay, MD     Interpretation: abnormal     ECG rate assessment: tachycardic     Rhythm: sinus rhythm     Ectopy: none     Conduction: normal     ____________________________________________   INITIAL IMPRESSION / ASSESSMENT AND PLAN / ED COURSE     65 year old female presents with multiple symptoms that are been going on for the past week.  Seems to started with febrile illness then developed cough shortness of breath diarrhea and abdominal pain.  On arrival to the ED she is significantly tachycardic, EKG shows sinus tachycardia to the 130s and was 140s.  Blood pressure is within normal limits.  She is afebrile.  She notes that she has had fevers up to 104 at home over the past week.  She endorses cough productive of clear sputum as  well as dyspnea denies chest pain.  Also with some mild right lower quadrant tenderness and diarrhea.  She went to urgent care today and had a negative COVID test and was sent here due to her abnormal vital signs.  On exam overall she is  well-appearing actually nontoxic.  Is somewhat tachypneic and diaphoretic.  Lungs are clear, her abdomen is tender in the epigastric right upper quadrant and right lower quadrant.  She also has some left calf tenderness but no obvious signs of DVT.  Labs obtained from triage are notable for significant leukocytosis of 24 and hemoglobin of 18.  On past labs she has had leukocytosis which may be from her underlying polycythemia however never this high.  She also had an AKI.  I suspect she is volume down.  Will give fluids.  Given her tachycardia tachypnea and dyspnea I am concerned for pulmonary embolism.  So we will obtain a CT angio.  Additionally with her ongoing fevers abdominal pain and leukocytosis we will scan her abdomen to rule out an abdominal source of infection.  We will send blood urine cultures and obtain a lactate.     Patient's lactate is 3.2.  With this elevated lactate and her white count and tachycardia we will cover broadly with Vanco and Zosyn.  I am awaiting read of her CT chest abdomen pelvis.  CT is notable for a large mass in the liver concerning for liver abscess.  There is also likely a diverticular abscess which could be the source.  I discussed with Dr. Dahlia Byes who agrees with plan for admission to medicine with antibiotics and discussion with interventional radiology.  Discussed with Dr. Ky Barban with interventional radiology who reviewed the scans.  He recommends getting an MRI as this could be primary malignancy.  Ordered the MRI liver protocol.  Patient signed out to hospitalist is aware of the plan. Clinical Course as of 11/02/20 1558  Sun Nov 02, 2020  1428 Lactic Acid, Venous(!!): 3.2 [KM]    Clinical Course User Index [KM] Rada Hay, MD     ____________________________________________   FINAL CLINICAL IMPRESSION(S) / ED DIAGNOSES  Final diagnoses:  Liver abscess  Diverticulitis  Sepsis, due to unspecified organism, unspecified whether acute organ  dysfunction present [facility])     ED Discharge Orders     None        Note:  This document was prepared using Dragon voice recognition software and may include unintentional dictation errors.    Rada Hay, MD 11/02/20 9201    Rada Hay, MD 11/02/20 [phone]

## 2020-11-02 NOTE — Consult Note (Signed)
Pharmacy Antibiotic Note  Countess Biebel is a 65 y.o. female admitted on 11/02/2020 with  complaints of shortness of breath and palpitations.  CT abdomen and pelvis showed liver lesions concerning for infection/abscess as well as possible acute diverticulitis with abscess. Pharmacy has been consulted for zosyn dosing.  Plan: Zosyn 3.375 gm IV q8h (4 hour infusion) Monitor clinical picture and renal function F/U C&S, abx deescalation / LOT   Temp (24hrs), Avg:100.4 F (38 C), Min:97.7 F (36.5 C), Max:103.7 F (39.8 C)  Recent Labs  Lab 11/02/20 1219 11/02/20 1323  WBC 24.5*  --   CREATININE 1.51*  --   LATICACIDVEN  --  3.2*    CrCl cannot be calculated (Unknown ideal weight.).    No Known Allergies  Antimicrobials this admission: 10/02 vancomycin x 1 10/02 Zosyn >>   Dose adjustments this admission: N/A  Microbiology results: 10/02 BCx: Pending 10/02 UCx: Pending   10/02 GI Panel: Pending  Thank you for allowing pharmacy to be a part of this patient's care.  Darnelle Bos, PharmD 11/02/2020 8:54 PM

## 2020-11-02 NOTE — ED Triage Notes (Signed)
Pt in via EMS from [facility] with c/o rapid heart rate and SOB. 140 HR, pt also diaphoretic, pt with fever for 1 week, cough and abd pain. Pt negative for COVID per [facility].

## 2020-11-03 ENCOUNTER — Other Ambulatory Visit: Payer: Self-pay

## 2020-11-03 ENCOUNTER — Encounter: Payer: Self-pay | Admitting: Internal Medicine

## 2020-11-03 ENCOUNTER — Inpatient Hospital Stay: Payer: Medicare HMO

## 2020-11-03 DIAGNOSIS — J9601 Acute respiratory failure with hypoxia: Secondary | ICD-10-CM

## 2020-11-03 DIAGNOSIS — I10 Essential (primary) hypertension: Secondary | ICD-10-CM | POA: Diagnosis present

## 2020-11-03 DIAGNOSIS — A419 Sepsis, unspecified organism: Secondary | ICD-10-CM

## 2020-11-03 DIAGNOSIS — I5032 Chronic diastolic (congestive) heart failure: Secondary | ICD-10-CM

## 2020-11-03 DIAGNOSIS — K750 Abscess of liver: Secondary | ICD-10-CM | POA: Diagnosis not present

## 2020-11-03 DIAGNOSIS — E872 Acidosis, unspecified: Secondary | ICD-10-CM

## 2020-11-03 DIAGNOSIS — E876 Hypokalemia: Secondary | ICD-10-CM

## 2020-11-03 DIAGNOSIS — J9600 Acute respiratory failure, unspecified whether with hypoxia or hypercapnia: Secondary | ICD-10-CM | POA: Insufficient documentation

## 2020-11-03 DIAGNOSIS — D45 Polycythemia vera: Secondary | ICD-10-CM

## 2020-11-03 DIAGNOSIS — K5792 Diverticulitis of intestine, part unspecified, without perforation or abscess without bleeding: Secondary | ICD-10-CM | POA: Diagnosis not present

## 2020-11-03 LAB — BLOOD GAS, ARTERIAL
Acid-base deficit: 1.3 mmol/L (ref 0.0–2.0)
Bicarbonate: 19.9 mmol/L — ABNORMAL LOW (ref 20.0–28.0)
FIO2: 0.28
O2 Saturation: 95.1 %
Patient temperature: 37
pCO2 arterial: 25 mmHg — ABNORMAL LOW (ref 32.0–48.0)
pH, Arterial: 7.51 — ABNORMAL HIGH (ref 7.350–7.450)
pO2, Arterial: 68 mmHg — ABNORMAL LOW (ref 83.0–108.0)

## 2020-11-03 LAB — COMPREHENSIVE METABOLIC PANEL
ALT: 65 U/L — ABNORMAL HIGH (ref 0–44)
AST: 59 U/L — ABNORMAL HIGH (ref 15–41)
Albumin: 2.6 g/dL — ABNORMAL LOW (ref 3.5–5.0)
Alkaline Phosphatase: 131 U/L — ABNORMAL HIGH (ref 38–126)
Anion gap: 11 (ref 5–15)
BUN: 17 mg/dL (ref 8–23)
CO2: 24 mmol/L (ref 22–32)
Calcium: 8.4 mg/dL — ABNORMAL LOW (ref 8.9–10.3)
Chloride: 102 mmol/L (ref 98–111)
Creatinine, Ser: 0.94 mg/dL (ref 0.44–1.00)
GFR, Estimated: >60 mL/min (ref >60)
Glucose, Bld: 111 mg/dL — ABNORMAL HIGH (ref 70–99)
Potassium: 3.2 mmol/L — ABNORMAL LOW (ref 3.5–5.1)
Sodium: 137 mmol/L (ref 135–145)
Total Bilirubin: 3.2 mg/dL — ABNORMAL HIGH (ref 0.3–1.2)
Total Protein: 7.1 g/dL (ref 6.5–8.1)

## 2020-11-03 LAB — URINE CULTURE: Culture: NO GROWTH

## 2020-11-03 LAB — CBC WITH DIFFERENTIAL/PLATELET
Abs Immature Granulocytes: 0.1 10*3/uL — ABNORMAL HIGH (ref 0.00–0.07)
Basophils Absolute: 0.1 10*3/uL (ref 0.0–0.1)
Basophils Relative: 1 %
Eosinophils Absolute: 0 10*3/uL (ref 0.0–0.5)
Eosinophils Relative: 0 %
HCT: 41.2 % (ref 36.0–46.0)
Hemoglobin: 15.1 g/dL — ABNORMAL HIGH (ref 12.0–15.0)
Immature Granulocytes: 1 %
Lymphocytes Relative: 5 %
Lymphs Abs: 0.6 10*3/uL — ABNORMAL LOW (ref 0.7–4.0)
MCH: 32.7 pg (ref 26.0–34.0)
MCHC: 36.7 g/dL — ABNORMAL HIGH (ref 30.0–36.0)
MCV: 89.2 fL (ref 80.0–100.0)
Monocytes Absolute: 0.2 10*3/uL (ref 0.1–1.0)
Monocytes Relative: 1 %
Neutro Abs: 12 10*3/uL — ABNORMAL HIGH (ref 1.7–7.7)
Neutrophils Relative %: 92 %
Platelets: 189 10*3/uL (ref 150–400)
RBC: 4.62 MIL/uL (ref 3.87–5.11)
RDW: 14 % (ref 11.5–15.5)
WBC: 13 10*3/uL — ABNORMAL HIGH (ref 4.0–10.5)
nRBC: 0 % (ref 0.0–0.2)

## 2020-11-03 LAB — HEMOGLOBIN A1C
Hgb A1c MFr Bld: 6.9 % — ABNORMAL HIGH (ref 4.8–5.6)
Mean Plasma Glucose: 151 mg/dL

## 2020-11-03 LAB — CBC
HCT: 42.3 % (ref 36.0–46.0)
Hemoglobin: 14.9 g/dL (ref 12.0–15.0)
MCH: 31.8 pg (ref 26.0–34.0)
MCHC: 35.2 g/dL (ref 30.0–36.0)
MCV: 90.2 fL (ref 80.0–100.0)
Platelets: 206 10*3/uL (ref 150–400)
RBC: 4.69 MIL/uL (ref 3.87–5.11)
RDW: 14.3 % (ref 11.5–15.5)
WBC: 23.6 10*3/uL — ABNORMAL HIGH (ref 4.0–10.5)
nRBC: 0 % (ref 0.0–0.2)

## 2020-11-03 LAB — POTASSIUM: Potassium: 3.6 mmol/L (ref 3.5–5.1)

## 2020-11-03 LAB — SARS CORONAVIRUS 2 (TAT 6-24 HRS): SARS Coronavirus 2: NEGATIVE

## 2020-11-03 LAB — CBG MONITORING, ED
Glucose-Capillary: 151 mg/dL — ABNORMAL HIGH (ref 70–99)
Glucose-Capillary: 131 mg/dL — ABNORMAL HIGH (ref 70–99)
Glucose-Capillary: 139 mg/dL — ABNORMAL HIGH (ref 70–99)
Glucose-Capillary: 123 mg/dL — ABNORMAL HIGH (ref 70–99)

## 2020-11-03 LAB — PROCALCITONIN: Procalcitonin: 38.72 ng/mL

## 2020-11-03 LAB — HIV ANTIBODY (ROUTINE TESTING W REFLEX): HIV Screen 4th Generation wRfx: NONREACTIVE

## 2020-11-03 LAB — BLOOD GAS, VENOUS
Acid-Base Excess: 2.2 mmol/L — ABNORMAL HIGH (ref 0.0–2.0)
Bicarbonate: 25.3 mmol/L (ref 20.0–28.0)
O2 Saturation: 81.6 %
Patient temperature: 37
pCO2, Ven: 34 mmHg — ABNORMAL LOW (ref 44.0–60.0)
pH, Ven: 7.48 — ABNORMAL HIGH (ref 7.250–7.430)
pO2, Ven: 42 mmHg (ref 32.0–45.0)

## 2020-11-03 LAB — LACTIC ACID, PLASMA: Lactic Acid, Venous: 2.3 mmol/L (ref 0.5–1.9)

## 2020-11-03 LAB — PHOSPHORUS
Phosphorus: 1.3 mg/dL — ABNORMAL LOW (ref 2.5–4.6)
Phosphorus: 1.6 mg/dL — ABNORMAL LOW (ref 2.5–4.6)

## 2020-11-03 LAB — MAGNESIUM
Magnesium: 1.7 mg/dL (ref 1.7–2.4)
Magnesium: 1.6 mg/dL — ABNORMAL LOW (ref 1.7–2.4)

## 2020-11-03 LAB — PROTIME-INR
INR: 1.3 — ABNORMAL HIGH (ref 0.8–1.2)
Prothrombin Time: 16 seconds — ABNORMAL HIGH (ref 11.4–15.2)

## 2020-11-03 LAB — CORTISOL-AM, BLOOD: Cortisol - AM: 39 ug/dL — ABNORMAL HIGH (ref 6.7–22.6)

## 2020-11-03 LAB — GLUCOSE, CAPILLARY: Glucose-Capillary: 158 mg/dL — ABNORMAL HIGH (ref 70–99)

## 2020-11-03 LAB — MRSA NEXT GEN BY PCR, NASAL: MRSA by PCR Next Gen: NOT DETECTED

## 2020-11-03 LAB — TROPONIN I (HIGH SENSITIVITY): Troponin I (High Sensitivity): 12 ng/L (ref <18)

## 2020-11-03 MED ORDER — SODIUM CHLORIDE 0.9 % IV BOLUS
250.0000 mL | Freq: Once | INTRAVENOUS | Status: AC
  Administered 2020-11-03: 250 mL via INTRAVENOUS

## 2020-11-03 MED ORDER — FENTANYL CITRATE (PF) 100 MCG/2ML IJ SOLN
INTRAMUSCULAR | Status: AC
  Filled 2020-11-03: qty 2

## 2020-11-03 MED ORDER — LACTATED RINGERS IV SOLN: INTRAVENOUS | Status: DC

## 2020-11-03 MED ORDER — MAGNESIUM SULFATE 2 GM/50ML IV SOLN: 2.0000 g | Freq: Once | INTRAVENOUS | Status: DC

## 2020-11-03 MED ORDER — MIDAZOLAM HCL 2 MG/2ML IJ SOLN
INTRAMUSCULAR | Status: DC | PRN
  Administered 2020-11-03 (×2): .5 mg via INTRAVENOUS
  Administered 2020-11-03: 1 mg via INTRAVENOUS

## 2020-11-03 MED ORDER — SODIUM CHLORIDE 0.9 % IV SOLN
250.0000 mL | INTRAVENOUS | Status: DC
  Administered 2020-11-03: 250 mL via INTRAVENOUS

## 2020-11-03 MED ORDER — CARVEDILOL 6.25 MG PO TABS: 12.5000 mg | ORAL_TABLET | Freq: Two times a day (BID) | ORAL | Status: DC

## 2020-11-03 MED ORDER — CARVEDILOL 6.25 MG PO TABS: 3.1250 mg | ORAL_TABLET | Freq: Two times a day (BID) | ORAL | Status: DC

## 2020-11-03 MED ORDER — PHENYLEPHRINE HCL-NACL 20-0.9 MG/250ML-% IV SOLN
25.0000 ug/min | INTRAVENOUS | Status: DC
Start: 2020-11-03 — End: 2020-11-04
  Filled 2020-11-03: qty 250

## 2020-11-03 MED ORDER — CARVEDILOL 6.25 MG PO TABS
3.1250 mg | ORAL_TABLET | Freq: Two times a day (BID) | ORAL | Status: DC
  Administered 2020-11-03: 3.125 mg via ORAL
  Filled 2020-11-03: qty 1

## 2020-11-03 MED ORDER — POTASSIUM CHLORIDE 2 MEQ/ML IV SOLN
INTRAVENOUS | Status: DC
  Filled 2020-11-03 (×2): qty 1000

## 2020-11-03 MED ORDER — LACTATED RINGERS IV BOLUS
1000.0000 mL | Freq: Once | INTRAVENOUS | Status: AC
  Administered 2020-11-03: 1000 mL via INTRAVENOUS

## 2020-11-03 MED ORDER — MAGNESIUM SULFATE 4 GM/100ML IV SOLN
4.0000 g | Freq: Once | INTRAVENOUS | Status: AC
  Administered 2020-11-03: 4 g via INTRAVENOUS
  Filled 2020-11-03: qty 100

## 2020-11-03 MED ORDER — ENOXAPARIN SODIUM 40 MG/0.4ML IJ SOSY: 40.0000 mg | PREFILLED_SYRINGE | INTRAMUSCULAR | Status: DC

## 2020-11-03 MED ORDER — DILTIAZEM HCL 25 MG/5ML IV SOLN
10.0000 mg | INTRAVENOUS | Status: AC
  Administered 2020-11-03: 10 mg via INTRAVENOUS
  Filled 2020-11-03: qty 5

## 2020-11-03 MED ORDER — CHLORHEXIDINE GLUCONATE CLOTH 2 % EX PADS
6.0000 | MEDICATED_PAD | Freq: Every day | CUTANEOUS | Status: DC
  Administered 2020-11-03 – 2020-11-07 (×4): 6 via TOPICAL

## 2020-11-03 MED ORDER — FENTANYL CITRATE (PF) 100 MCG/2ML IJ SOLN
INTRAMUSCULAR | Status: DC | PRN
  Administered 2020-11-03: 50 ug via INTRAVENOUS
  Administered 2020-11-03 (×2): 25 ug via INTRAVENOUS

## 2020-11-03 MED ORDER — IPRATROPIUM-ALBUTEROL 0.5-2.5 (3) MG/3ML IN SOLN: 3.0000 mL | Freq: Four times a day (QID) | RESPIRATORY_TRACT | Status: DC | PRN

## 2020-11-03 MED ORDER — DEXTROSE 5 % IV SOLN
45.0000 mmol | Freq: Once | INTRAVENOUS | Status: AC
  Administered 2020-11-03: 45 mmol via INTRAVENOUS
  Filled 2020-11-03: qty 15

## 2020-11-03 MED ORDER — MIDAZOLAM HCL 2 MG/2ML IJ SOLN
INTRAMUSCULAR | Status: AC
  Filled 2020-11-03: qty 2

## 2020-11-03 MED ORDER — LACTATED RINGERS IV BOLUS
250.0000 mL | Freq: Once | INTRAVENOUS | Status: AC
  Administered 2020-11-03: 250 mL via INTRAVENOUS

## 2020-11-03 MED ORDER — LACTATED RINGERS IV BOLUS
500.0000 mL | Freq: Once | INTRAVENOUS | Status: AC
  Administered 2020-11-03: 500 mL via INTRAVENOUS

## 2020-11-03 MED ORDER — METOPROLOL TARTRATE 5 MG/5ML IV SOLN
5.0000 mg | INTRAVENOUS | Status: DC | PRN
  Administered 2020-11-03: 5 mg via INTRAVENOUS
  Filled 2020-11-03 (×2): qty 5

## 2020-11-03 MED ORDER — DM-GUAIFENESIN ER 30-600 MG PO TB12
1.0000 | ORAL_TABLET | Freq: Two times a day (BID) | ORAL | Status: DC
  Administered 2020-11-03 – 2020-11-07 (×8): 1 via ORAL
  Filled 2020-11-03 (×8): qty 1

## 2020-11-03 MED ORDER — SODIUM BICARBONATE 8.4 % IV SOLN
50.0000 meq | Freq: Once | INTRAVENOUS | Status: AC
  Administered 2020-11-03: 50 meq via INTRAVENOUS
  Filled 2020-11-03: qty 50

## 2020-11-03 MED ORDER — ENOXAPARIN SODIUM 60 MG/0.6ML IJ SOSY
0.5000 mg/kg | PREFILLED_SYRINGE | INTRAMUSCULAR | Status: DC
  Filled 2020-11-03: qty 0.55

## 2020-11-03 MED ORDER — VANCOMYCIN HCL 1500 MG/300ML IV SOLN
1500.0000 mg | INTRAVENOUS | Status: DC
  Administered 2020-11-03: 1500 mg via INTRAVENOUS
  Filled 2020-11-03 (×2): qty 300

## 2020-11-03 MED ORDER — MEPERIDINE HCL 25 MG/ML IJ SOLN
25.0000 mg | Freq: Once | INTRAMUSCULAR | Status: AC
  Administered 2020-11-03: 25 mg via INTRAVENOUS
  Filled 2020-11-03: qty 1

## 2020-11-03 MED ORDER — CARVEDILOL 6.25 MG PO TABS: 6.2500 mg | ORAL_TABLET | Freq: Once | ORAL | Status: DC

## 2020-11-03 NOTE — Progress Notes (Signed)
[facility] SURGICAL PROGRESS NOTE (cpt [phone])  Hospital Day(s): 1.   Interval History: Patient seen and examined, no acute events or new complaints overnight. Patient reports she is having some upper abdominal discomfort this morning but overall not too painful. she denies fever, chills, cough, nausea, emesis. She continues to have leukocytosis to 23.6K this morning. Renal function remains normal; sCr - 0.94; UO - unmeasured. Hypokalemia to 3.2. MRI yesterday concerning for liver abscesses to the posterior right lobe. IR consulted on admission by hospitalist. She is currently NPO. Continues on Zosyn.   Review of Systems:  Constitutional: denies fever, chills  HEENT: denies cough or congestion  Respiratory: denies any shortness of breath  Cardiovascular: denies chest pain or palpitations  Gastrointestinal: + abdominal pain (improved), denied N/V, or diarrhea/and bowel function as per interval history Genitourinary: denies burning with urination or urinary frequency  Vital signs in last 24 hours: [min-max] current  Temp:  [97.7 F (36.5 C)-103.7 F (39.8 C)] 99.1 F (37.3 C) (10/03 0726) Pulse Rate:  [89-142] 111 (10/03 0726) Resp:  [12-70] 26 (10/03 0726) BP: (86-130)/(45-87) 116/49 (10/03 0726) SpO2:  [90 %-100 %] 99 % (10/03 0726)             Intake/Output last 2 shifts:  10/02 0701 - 10/03 0700 In: 1500 [IV Piggyback:1500] Out: -    Physical Exam:  Constitutional: alert, cooperative and no distress  HENT: normocephalic without obvious abnormality  Eyes: PERRL, EOM's grossly intact and symmetric  Respiratory: breathing non-labored at rest  Cardiovascular: regular rate and sinus rhythm  Gastrointestinal: Obese, soft, she does not appear overtly tender, non-distended, no rebound/guarding Musculoskeletal: no edema or wounds, motor and sensation grossly intact, NT    Labs:  CBC Latest Ref Rng & Units 11/03/2020 11/02/2020 11/02/2020  WBC 4.0 - 10.5 K/uL 23.6(H)  21.9(H) 24.5(H)  Hemoglobin 12.0 - 15.0 g/dL 14.9 15.0 18.0(H)  Hematocrit 36.0 - 46.0 % 42.3 41.3 48.1(H)  Platelets 150 - 400 K/uL 206 196 216   CMP Latest Ref Rng & Units 11/03/2020 11/02/2020 05/03/2015  Glucose 70 - 99 mg/dL 111(H) 222(H) 92  BUN 8 - 23 mg/dL 17 20 12   Creatinine 0.44 - 1.00 mg/dL 0.94 1.51(H) 0.80  Sodium 135 - 145 mmol/L 137 136 137  Potassium 3.5 - 5.1 mmol/L 3.2(L) 3.4(L) 3.7  Chloride 98 - 111 mmol/L 102 103 107  CO2 22 - 32 mmol/L 24 21(L) 27  Calcium 8.9 - 10.3 mg/dL 8.4(L) 9.2 8.3(L)  Total Protein 6.5 - 8.1 g/dL 7.1 8.0 -  Total Bilirubin 0.3 - 1.2 mg/dL 3.2(H) 2.9(H) -  Alkaline Phos 38 - 126 U/L 131(H) 135(H) -  AST 15 - 41 U/L 59(H) 64(H) -  ALT 0 - 44 U/L 65(H) 77(H) -    Imaging studies: No new pertinent imaging studies   Assessment/Plan: (ICD-10's: K57.92) 65 y.o. female with possible acute diverticulitis with two right posterior liver lobe abscesses   - NPO for planned IR interventions on liver abscess - Continue IV Abx (Zosyn)   - Monitor leukocytosis; morning labs - Monitor abdominal examination   - Pain control prn; antiemetics prn    - No emergent surgical interventions - Further management per primary service; we will follow    All of the above findings and recommendations were discussed with the patient, and the medical team, and all of patient's questions were answered to her expressed satisfaction.  -- Edison Simon, [name] [facility] 11/03/2020, 7:33 AM [phone] M-F: 7am -  4pm

## 2020-11-03 NOTE — ED Notes (Signed)
Cardiologist at bedside examining pt. Pt restless in nature moving around in bed.

## 2020-11-03 NOTE — ED Notes (Signed)
Pt restless in bed, stating she wants to get up and go to bathroom.  This RN educated pt on importance of not getting up, placed purewick and educated on function at this time.

## 2020-11-03 NOTE — H&P (Signed)
Chief Complaint: Patient was seen in consultation today for image guided liver abscess drainage catheters placement with moderate sedation  at the request of Dr. Judd Gaudier.  Referring Physician(s): Dr. Judd Gaudier.  Supervising Physician: Sandi Mariscal  Patient Status: [facility] - ED  History of Present Illness: Stacy Cox is a 65 y.o. female with PMH CHF, COPD, DM type II, GERD, heart disease, HTN, polycythemia vera and tobacco abuse.  Patient presented to the ED on 11/02/2020 complaining of fever and shortness of breath x1 week.  She also complained of right lower quadrant pain with frequent diarrhea. CT abdomen pelvis imaging in the ED resulted 2 masslike areas over the right lobe of the liver concerning for infection/abscess and possible metastatic disease as well as primary liver neoplasm.  Patient was referred by Dr. Judd Gaudier for liver abscess drainage catheters to be placed.  Dr. Sandi Mariscal, IR, has reviewed and approved liver abscess drainage catheters to be placed.  CT abdomen pelvis with contrast 11/02/2020: IMPRESSION: 1. No evidence of pulmonary embolism and no acute cardiopulmonary disease. 2. Two adjacent masslike areas of low attenuation over the right lobe of the liver with the larger measuring 7.8 x 9.2 cm and more inferiorly over the right lobe measuring 5.5 x 8.8 cm. Findings may be due to infection/abscess in light of possible diverticular abscess as described below. Metastatic disease is also a concern as well as possible primary liver neoplasm. 3. There is an indeterminate 1.8 cm hypodensity containing minimal fluid and air which abuts the superolateral aspect of the adjacent sigmoid colon and may represent evidence of diverticulitis with small diverticular abscess. 4. Aortic atherosclerosis. Atherosclerotic coronary artery disease. 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 Recommend thyroid ultrasound for further evaluation.  (Ref: J Am Coll Radiol. 2015 Feb;12(2): 143-50). 6. 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 Few subcentimeter left renal cortical hypodensities, too small to characterize, but likely cysts.   Aortic Atherosclerosis (ICD10-I70.0).    Past Medical History:  Diagnosis Date  . CHF (congestive heart failure) ([place])   . COPD (chronic obstructive pulmonary disease) ([place])   . Diabetes mellitus, type 2 ([place])   . GERD (gastroesophageal reflux disease)   . Heart disease   . Hypertension   . Joint pain   . Polycythemia vera ([place])   . Tobacco abuse     History reviewed. No pertinent surgical history.  Allergies: Patient has no known allergies.  Medications: Prior to Admission medications   Medication Sig Start Date End Date Taking? Authorizing Provider  aspirin 325 MG EC tablet Take 325 mg by mouth daily.   Yes [provider]  b complex vitamins tablet Take 1 tablet by mouth every other day.    Yes [provider]  COREG CR 80 MG 24 hr capsule Take 1 tablet by mouth daily. 11/20/14  Yes [provider]  furosemide (LASIX) 40 MG tablet Take 1 tablet by mouth daily. 11/19/14  Yes [provider]  gabapentin (NEURONTIN) 100 MG capsule Take 100-200 mg by mouth 3 (three) times daily. 10/20/20  Yes [provider]  lactobacillus acidophilus & bulgar (LACTINEX) chewable tablet Chew 1 tablet by mouth 3 (three) times daily with meals. 05/03/15  Yes Mody, Ulice Bold, MD  losartan (COZAAR) 25 MG tablet Take 1 tablet by mouth daily. 12/03/14  Yes [provider]  metFORMIN (GLUCOPHAGE) 500 MG tablet Take 1 tablet by mouth daily with breakfast. 11/19/14  Yes [provider]  Multiple  Vitamins-Minerals (MULTIVITAL PO) Take 1 tablet by mouth daily.   Yes [provider]  pantoprazole (PROTONIX) 40 MG tablet Take 1 tablet by mouth daily. 11/19/14  Yes [provider]  potassium chloride SA (K-DUR,KLOR-CON) 20 MEQ tablet Take 1 tablet by mouth  daily. 11/20/14  Yes [provider]  spironolactone (ALDACTONE) 25 MG tablet Take 1 tablet by mouth daily. 11/19/14  Yes [provider]     Family History  Problem Relation Age of Onset  . Breast cancer Sister        sister #1 age 65  . Kidney cancer Sister        sister #1 age -26's  . Breast cancer Sister        sister #2 age 14's  . Kidney disease Sister        sister #2 age 41's  . Diabetes Mother   . Heart disease Mother        + BYPASS SURGERY  . Deep vein thrombosis Mother   . Arthritis Mother   . Diabetes Sister   . Heart disease Sister   . Hypertension Brother   . Heart attack Father     Social History   Socioeconomic History  . Marital status: Divorced    Spouse name: Not on file  . Number of children: Not on file  . Years of education: Not on file  . Highest education level: Not on file  Occupational History  . Not on file  Tobacco Use  . Smoking status: Every Day    Packs/day: 0.25    Years: 40.00    Pack years: 10.00    Types: Cigarettes  . Smokeless tobacco: Never  . Tobacco comments:    smoking since teens  Substance and Sexual Activity  . Alcohol use: No    Alcohol/week: 0.0 standard drinks  . Drug use: No  . Sexual activity: Not Currently  Other Topics Concern  . Not on file  Social History Narrative  . Not on file   Social Determinants of Health   Financial Resource Strain: Not on file  Food Insecurity: Not on file  Transportation Needs: Not on file  Physical Activity: Not on file  Stress: Not on file  Social Connections: Not on file     Review of Systems: A 12 point ROS discussed and pertinent positives are indicated in the HPI above.  All other systems are negative.  Review of Systems  Constitutional:  Positive for chills, fatigue and fever.  HENT:  Negative for nosebleeds.   Respiratory:  Positive for cough and shortness of breath.   Cardiovascular:  Positive for chest pain. Negative for leg swelling.   Gastrointestinal:  Positive for abdominal pain and diarrhea. Negative for blood in stool, nausea and vomiting.  Genitourinary:  Positive for hematuria.   Vital Signs: BP (!) 121/59   Pulse (!) 108   Temp 99.1 F (37.3 C) (Oral)   Resp 19   SpO2 92%   Physical Exam Constitutional:      Appearance: Normal appearance. She is not ill-appearing.  HENT:     Head: Normocephalic and atraumatic.     Mouth/Throat:     Mouth: Mucous membranes are dry.     Pharynx: Oropharynx is clear.  Cardiovascular:     Rate and Rhythm: Regular rhythm. Tachycardia present.     Heart sounds: No murmur heard. Pulmonary:     Effort: Pulmonary effort is normal. No respiratory distress.     Breath  sounds: No stridor. No wheezing, rhonchi or rales.  Abdominal:     Palpations: Abdomen is soft.     Tenderness: There is abdominal tenderness. There is no guarding.  Musculoskeletal:     Right lower leg: No edema.     Left lower leg: No edema.  Skin:    General: Skin is warm and dry.  Neurological:     Mental Status: She is alert and oriented to person, place, and time.  Psychiatric:        Mood and Affect: Mood normal.        Behavior: Behavior normal.        Thought Content: Thought content normal.        Judgment: Judgment normal.    Imaging: DG Chest 2 View  Result Date: 11/02/2020 CLINICAL DATA:  Shortness of breath EXAM: CHEST - 2 VIEW COMPARISON:  May 02, 2015 FINDINGS: The heart, hila, and mediastinum are normal. No pneumothorax. No nodules or masses. No focal infiltrates or overt edema. IMPRESSION: No active cardiopulmonary disease. Electronically Signed   By: Dorise Bullion III M.D.   On: 11/02/2020 13:02   CT Angio Chest PE W and/or Wo Contrast  Result Date: 11/02/2020 CLINICAL DATA:  Tachycardia. Abdominal pain. Suspect pulmonary embolism. EXAM: CT ANGIOGRAPHY CHEST CT ABDOMEN AND PELVIS WITH CONTRAST TECHNIQUE: Multidetector CT imaging of the chest was performed using the standard  protocol during bolus administration of intravenous contrast. Multiplanar CT image reconstructions and MIPs were obtained to evaluate the vascular anatomy. Multidetector CT imaging of the abdomen and pelvis was performed using the standard protocol during bolus administration of intravenous contrast. CONTRAST:  70m OMNIPAQUE IOHEXOL 350 MG/ML SOLN COMPARISON:  CT abdomen/pelvis 05/02/2015 and CT chest 02/14/2008 FINDINGS: CTA CHEST FINDINGS Cardiovascular: Heart is normal size. Calcified plaque over the left main and 3 vessel coronary arteries. Thoracic aorta is normal in caliber. Pulmonary arterial system is well opacified without evidence of emboli. Mediastinum/Nodes: No mediastinal or hilar adenopathy. 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 Lungs/Pleura: Lungs are well inflated without focal airspace consolidation or effusion. Airways are normal. Musculoskeletal: Mild degenerative change of the spine. Review of the MIP images confirms the above findings. CT ABDOMEN and PELVIS FINDINGS Hepatobiliary: There are 2 adjacent masslike areas of low attenuation over the right lobe of the liver measuring 7.8 x 9.2 cm and more inferiorly over the right lobe measuring 5.5 x 8.8 cm. Gallbladder is somewhat contracted. Biliary tree is normal. Pancreas: Normal. Spleen: Normal. Adrenals/Urinary Tract: Right adrenal gland is normal. 3.1 cm left adrenal mass without significant change. Kidneys are normal in size without hydronephrosis or nephrolithiasis. Couple subcentimeter left renal cortical hypodensities too small to characterize but likely cysts. Ureters and bladder are normal. Stomach/Bowel: Stomach and small bowel are normal. Appendix is normal. Minimal diverticulosis of the colon. There is a rounded 1.8 cm hypodensity containing minimal fluid and air which abuts the superolateral aspect of the adjacent sigmoid colon and may represent evidence of diverticulitis with small diverticular abscess.  This structure also abuts the anterior aspect of the adjacent left ovary. Vascular/Lymphatic: Abdominal aorta is normal caliber with mild calcified plaque present. No adenopathy. Reproductive: Uterus and right ovary are normal. Left ovary likely normal although does abut an indeterminate structure as described above. Other: None. Musculoskeletal: No focal abnormality. IMPRESSION: 1. No evidence of pulmonary embolism and no acute cardiopulmonary disease. 2. Two adjacent masslike areas of low attenuation over the right lobe of the liver with  the larger measuring 7.8 x 9.2 cm and more inferiorly over the right lobe measuring 5.5 x 8.8 cm. Findings may be due to infection/abscess in light of possible diverticular abscess as described below. Metastatic disease is also a concern as well as possible primary liver neoplasm. 3. There is an indeterminate 1.8 cm hypodensity containing minimal fluid and air which abuts the superolateral aspect of the adjacent sigmoid colon and may represent evidence of diverticulitis with small diverticular abscess. 4. Aortic atherosclerosis. Atherosclerotic coronary artery disease. 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 Recommend thyroid ultrasound for further evaluation. (Ref: J Am Coll Radiol. 2015 Feb;12(2): 143-50). 6. 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 Few subcentimeter left renal cortical hypodensities, too small to characterize, but likely cysts. Aortic Atherosclerosis (ICD10-I70.0). Electronically Signed   By: Marin Olp M.D.   On: 11/02/2020 15:01   CT ABDOMEN PELVIS W CONTRAST  Result Date: 11/02/2020 CLINICAL DATA:  Tachycardia. Abdominal pain. Suspect pulmonary embolism. EXAM: CT ANGIOGRAPHY CHEST CT ABDOMEN AND PELVIS WITH CONTRAST TECHNIQUE: Multidetector CT imaging of the chest was performed using the standard protocol during bolus administration of intravenous contrast. Multiplanar CT image reconstructions and MIPs were obtained to evaluate  the vascular anatomy. Multidetector CT imaging of the abdomen and pelvis was performed using the standard protocol during bolus administration of intravenous contrast. CONTRAST:  72m OMNIPAQUE IOHEXOL 350 MG/ML SOLN COMPARISON:  CT abdomen/pelvis 05/02/2015 and CT chest 02/14/2008 FINDINGS: CTA CHEST FINDINGS Cardiovascular: Heart is normal size. Calcified plaque over the left main and 3 vessel coronary arteries. Thoracic aorta is normal in caliber. Pulmonary arterial system is well opacified without evidence of emboli. Mediastinum/Nodes: No mediastinal or hilar adenopathy. 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 Lungs/Pleura: Lungs are well inflated without focal airspace consolidation or effusion. Airways are normal. Musculoskeletal: Mild degenerative change of the spine. Review of the MIP images confirms the above findings. CT ABDOMEN and PELVIS FINDINGS Hepatobiliary: There are 2 adjacent masslike areas of low attenuation over the right lobe of the liver measuring 7.8 x 9.2 cm and more inferiorly over the right lobe measuring 5.5 x 8.8 cm. Gallbladder is somewhat contracted. Biliary tree is normal. Pancreas: Normal. Spleen: Normal. Adrenals/Urinary Tract: Right adrenal gland is normal. 3.1 cm left adrenal mass without significant change. Kidneys are normal in size without hydronephrosis or nephrolithiasis. Couple subcentimeter left renal cortical hypodensities too small to characterize but likely cysts. Ureters and bladder are normal. Stomach/Bowel: Stomach and small bowel are normal. Appendix is normal. Minimal diverticulosis of the colon. There is a rounded 1.8 cm hypodensity containing minimal fluid and air which abuts the superolateral aspect of the adjacent sigmoid colon and may represent evidence of diverticulitis with small diverticular abscess. This structure also abuts the anterior aspect of the adjacent left ovary. Vascular/Lymphatic: Abdominal aorta is normal caliber with  mild calcified plaque present. No adenopathy. Reproductive: Uterus and right ovary are normal. Left ovary likely normal although does abut an indeterminate structure as described above. Other: None. Musculoskeletal: No focal abnormality. IMPRESSION: 1. No evidence of pulmonary embolism and no acute cardiopulmonary disease. 2. Two adjacent masslike areas of low attenuation over the right lobe of the liver with the larger measuring 7.8 x 9.2 cm and more inferiorly over the right lobe measuring 5.5 x 8.8 cm. Findings may be due to infection/abscess in light of possible diverticular abscess as described below. Metastatic disease is also a concern as well as possible primary liver neoplasm.  3. There is an indeterminate 1.8 cm hypodensity containing minimal fluid and air which abuts the superolateral aspect of the adjacent sigmoid colon and may represent evidence of diverticulitis with small diverticular abscess. 4. Aortic atherosclerosis. Atherosclerotic coronary artery disease. 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 Recommend thyroid ultrasound for further evaluation. (Ref: J Am Coll Radiol. 2015 Feb;12(2): 143-50). 6. 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 Few subcentimeter left renal cortical hypodensities, too small to characterize, but likely cysts. Aortic Atherosclerosis (ICD10-I70.0). Electronically Signed   By: Marin Olp M.D.   On: 11/02/2020 15:01   MR LIVER W WO CONTRAST  Result Date: 11/02/2020 CLINICAL DATA:  Hepatic and left adrenal masses on recent CT. Abdominal pain. Diverticulitis. EXAM: MRI ABDOMEN WITHOUT AND WITH CONTRAST TECHNIQUE: Multiplanar multisequence MR imaging of the abdomen was performed both before and after the administration of intravenous contrast. CONTRAST:  72m GADAVIST GADOBUTROL 1 MMOL/ML IV SOLN COMPARISON:  CT on 11/02/2020 and 05/02/2015 FINDINGS: Lower chest: No acute findings. Hepatobiliary: Image degradation by motion artifact noted. Diffuse  hepatic steatosis is noted. Two adjacent complex cystic lesions are seen in the posterior right hepatic lobe which measure 7.0 cm and 8.6 cm in maximum diameter. These both show numerous thin internal septations and lack of solid component, highly suspicious for hepatic abscesses. Gallbladder is unremarkable. No evidence of biliary ductal dilatation. Pancreas:  No mass or inflammatory changes. Spleen:  Within normal limits in size and appearance. Adrenals/Urinary Tract: A small left adrenal mass remains stable compared to CT in 2017, and shows signal dropout on chemical shift imaging, consistent with benign adrenal adenoma. Tiny left renal cyst is noted. No renal masses identified. No evidence of hydronephrosis. Stomach/Bowel: Visualized portion unremarkable. Vascular/Lymphatic: No pathologically enlarged lymph nodes identified. No acute vascular findings. Other:  None. Musculoskeletal:  No suspicious bone lesions identified. IMPRESSION: Two large complex cystic lesions in the posterior right hepatic lobe, highly suspicious for hepatic abscesses. Diffuse hepatic steatosis. Stable small benign left adrenal adenoma. Electronically Signed   By: JMarlaine HindM.D.   On: 11/02/2020 19:37    Labs:  CBC: Recent Labs    11/02/20 1219 11/02/20 2141 11/03/20 0619  WBC 24.5* 21.9* 23.6*  HGB 18.0* 15.0 14.9  HCT 48.1* 41.3 42.3  PLT 216 196 206    COAGS: Recent Labs    11/03/20 0619  INR 1.3*    BMP: Recent Labs    11/02/20 1219 11/03/20 0619  NA 136 137  K 3.4* 3.2*  CL 103 102  CO2 21* 24  GLUCOSE 222* 111*  BUN 20 17  CALCIUM 9.2 8.4*  CREATININE 1.51* 0.94  GFRNONAA 39* >60    LIVER FUNCTION TESTS: Recent Labs    11/02/20 1219 11/03/20 0619  BILITOT 2.9* 3.2*  AST 64* 59*  ALT 77* 65*  ALKPHOS 135* 131*  PROT 8.0 7.1  ALBUMIN 3.1* 2.6*    TUMOR MARKERS: No results for input(s): AFPTM, CEA, CA199, CHROMGRNA in the last 8760 hours.  Assessment and Plan:  History of CHF,  COPD, DM type II, GERD, heart disease, HTN, polycythemia vera and tobacco abuse.  Patient presented to the ED on 11/02/2020 complaining of fever and shortness of breath x1 week.  She also complained of right lower quadrant pain with frequent diarrhea. CT abdomen pelvis imaging in the ED resulted 2 masslike areas over the right lobe of the liver concerning for infection/abscess and possible metastatic disease as well as primary liver neoplasm.  Patient was referred by Dr. Judd Gaudier for liver abscess drainage catheters to be placed.  Dr. Sandi Mariscal, IR, has reviewed and approved liver abscess drainage catheters to be placed.  Pt resting quietly on stretcher with family member at bedside.  She is A&O, calm and pleasant. She is in no distress.  Pt states that she is ready to get procedure done.  Pt confirms that she is NPO per order with the exception of some ice chips.  She is tachycardic at 118, other VSS.  Pt denies taking blood thinning medications.  Labs 11/30/2020: WBC 23.6 INR 1.3  Total bili 3.2  AST 59  ALT 65  Alk phos 131  Risks and benefits of placing the image guided liver abscess drainage catheters discussed with the patient including bleeding, infection, damage to adjacent structures, bowel perforation/fistula connection, and sepsis.  All of the patient's questions were answered, patient is agreeable to proceed. Consent signed and in chart.   Thank you for this interesting consult.  I greatly enjoyed meeting Bassheva Flury and look forward to participating in their care.  A copy of this report was sent to the requesting provider on this date.  Electronically Signed: Tyson Alias, NP 11/03/2020, 9:21 AM   I spent a total of 30 minutes in face to face in clinical consultation, greater than 50% of which was counseling/coordinating care for image guided liver abscess drainage catheters with moderate sedation.

## 2020-11-03 NOTE — Progress Notes (Signed)
eLink Physician-Brief Progress Note Patient Name: Stacy Cox DOB: 1955/09/26 MRN: [id number]   Date of Service  11/03/2020  HPI/Events of Note  64/F with dCHF, active smoker, COPD, presenting with 1 week hx of shortness of breath, abdominal pain, cough, fever, diarrhea. Work-up revealing leukocytosis 24.5, lactic acidosis 3.2,  COVID negative, clear urinalysis.  CTA with no PE or acute cardiopulomonary process.   CT abdomen and pelvis however showing 2 mass-like lesions over the right lobe of the liver concerning for abscess in light of possible diverticular abscess or metastatic disease.  There is also evidence of diverticulitis with small diverticular abscess. Pt started on Vanc and Z[place]n,  Surgery and IR consulted.   Pt in junctional tachycardia, improved with IV cardizem.   On camera assessment, pt is sleeping and does not appear to be in distress. BP 94/67, HR 106, RR 30s, o2 sats 95%   eICU Interventions  Sepsis with shock Acute diverticulitis with possible abscesses s/p hepatic abscess aspiration and drain placement 11/03/20 Acute respiratory failure Junctional tachycardia Chronic diastolic CHF COPD DM  Continue antibiotics.  Neosynephrine to maintain MAP >65. Monitor I&Os. Supplements O2. Duonebs prn.  Replete electrolytes. Trend lactate. Insulin for glucose control. Start on lovenox for DVT prophylaxis.      Intervention Category Evaluation Type: New Patient Evaluation  Elsie Lincoln 11/03/2020, 8:44 PM

## 2020-11-03 NOTE — ED Notes (Signed)
MD [place] at bedside examining pt.

## 2020-11-03 NOTE — Progress Notes (Signed)
Patient ID: Stacy Cox, female   DOB: 01-Jul-1955, 65 y.o.   MRN: [id number]  Patient came back from IR drainage of liver abscess and heart rate was as high as 190.  Told she was in atrial fibrillation but looks more regular now.  Push 5 mg of metoprolol with out much response.  Push 10 mg of diltiazem and heart rate came down to 118.  We will change status to stepdown status.  We will consult critical care specialist and cardiology Dr. Humphrey Rolls.  Continue low-dose Coreg if blood pressure will handle.  We will give an IV fluid bolus and increase IV fluids.  Patient's mental status a little more impaired and did have a rigor after procedure.  Patient's respiratory rate and shallow breathing.  Dr Loletha Grayer

## 2020-11-03 NOTE — Progress Notes (Signed)
Patient post x2 Abscess drain placement on right lateral abdomen per Dr Pascal Lux. Vitals as noted. Patient bp stable, during latter part of procedure, became more tachycardic, tachypneic, although no visible signs of respiratory distress. Visible signs of riggers post drain placement. Order placed verbal per Dr Pascal Lux for patient to receive Demerol , thus notified Jerilee Hoh in ED of plan of care upon return to Er. Patient alert;oriented post procedure. Post procedure/recovery bedside report given to Jerilee Hoh in ED 11. Explain riggers to RN in er and that treatment that is effective is Demerol.

## 2020-11-03 NOTE — Procedures (Signed)
Pre procedural Dx: Hepatic abscesses Post procedural Dx: Same  Technically successful CT guided placed of two 12 Fr drainage catheter placement into the dominant hepatic abscesses within the superior and mid medial aspect of the right lobe of the liver yielding a total of 200 cc of bloody purulent fluid.   A representative aspirated sample was capped and sent to the laboratory for analysis.    Both drains connected to gravity bags.  EBL: Trace Complications: None immediate  Ronny Bacon, MD Pager #: [phone]

## 2020-11-03 NOTE — ED Notes (Signed)
BG resulted 131

## 2020-11-03 NOTE — ED Notes (Signed)
Pt ambulated to bathroom and upon getting back in bed, HR is 138-143. Pt reports being short of breath when getting back into bed, has now resolved.  HR is 138.  MD notified.

## 2020-11-03 NOTE — ED Notes (Signed)
Pt back from specials. Pt tachy with irregular HR in 160'180's, pt tachypneic, BP low, [place] applied  at 2L. MD [place] notified of pt unstable condition, MD stated they would come examine pt at bedside.

## 2020-11-03 NOTE — ED Notes (Signed)
Pt still fidgeting in bed at this time.

## 2020-11-03 NOTE — ED Notes (Signed)
PT increasing HR , tachy at 112.  MD notified.

## 2020-11-03 NOTE — Consult Note (Signed)
NAME:  Stacy Cox, MRN:  [id number], DOB:  20-Sep-1955, LOS: 1 ADMISSION DATE:  11/02/2020, CONSULTATION DATE: 11/03/2020 REFERRING MD: Dr. Leslye Peer, CHIEF COMPLAINT: Sepsis   History of Present Illness:  This is a 65 yo female who presented to [facility] ER on 10/2 from [facility] with c/o 1 week hx of shortness of breath, cough, fever, abdominal pain, and sore throat.  At [facility] pt tested negative for COVID-19.  She also endorsed 3-4 episodes of diarrhea daily along with decreased appetite.    ED Course: Upon arrival to the ER EKG revealed sinus tachycardia, hr 138, Qtc 548, and nonspecific ST/T wave abnormality.  Lab results revealed K+ 3.4, CO2 21, glucose 222, creatinine 1.51, alk phos 135, albumin 3.1, AST 64, ALT 77, total bilirubin 2.9, BNP 161.1, lactic acid 3.2, wbc 24.5, hgb 18.0, UA negative for UTI, and COVID-19 negative. CTA Chest negative for PE or acute cardiopulmonary disease.  However, CT Abd Pelvis revealed possible diverticulitis with small diverticular abscess; two adjacent mass like areas over the right lobe of the liver and more inferiorly over the right lobe concerning for infection/ abscess in light of possible diverticular abscess; although metastatic disease also a concern as well as possible primary liver neoplasm.  Pt ruled in for sepsis with likely source acute diverticulitis and liver abscess  She received vancomycin and zosyn.  General Surgeon Dr. Dahlia Byes consulted per ER physician recommended consulting interventional radiology for drain placement and obtaining MRI Liver.  Pt subsequently admitted to the progressive care unit, however pt remained in the ER pending bed availability.    Hospital Course: MR Liver revealed two large complex cystic lesions in the posterior right hepatic lobe, highly suspicious for hepatic abscesses.  On 10/3 she underwent CT guided placement of two 12 Fr drainage catheters into the dominant hepatic abscesses within the superior and mid  medial aspect of the right lobe of the liver yielding a total of 200 cc of bloody purulent fluid and both drains connected to gravity bags.  Following placement of drains pt became tachycardic and tachypneic with rigors.  She received 25 mg of demerol and transported back to the ER post procedure.   Upon return back to the ER pts hr 190's with EKG revealing junctional tachycardia, heart rate 145, and Qtc 440.  She received 5 mg of iv metoprolol, however heart rate remained elevated.  Therefore, she received 10 mg of iv diltiazem and heart rate decreased to 118.  She also received 250 ml NS fluid bolus.  Pts status changed to Stepdown.  Cardiology and PCCM team consulted to assist with management.   Pertinent  Medical History  COPD Type II Diabetes Mellitus  GERD  HTN Polycythemia Vera  Tobacco Abuse  Chronic Diastolic CHF   Significant Hospital Events: Including procedures, antibiotic start and stop dates in addition to other pertinent events   10/2: Pt admitted to the progressive care unit with septic shock secondary to acute diverticulitis with hepatic abscesses.  However, she remained in the ER pending bed availability  10/3: Pt underwent CT guided placement of two 12 Fr drainage catheters into the dominant hepatic abscesses within the superior and mid medial aspect of the right lobe of the liver yielding a total of 200 cc of bloody purulent fluid and both drains connected to gravity bags 10/3: Post drain placement pt developed junctional tachycardia 140-190 and tachypnea along rigors.  PCCM team and Cardiology team consulted to assist with management.  Significant Test  Results:  CTA Chest/CT Abd/Pelvis 10/2>>No evidence of pulmonary embolism and no acute cardiopulmonary disease. Two adjacent masslike areas of low attenuation over the right lobe of the liver with the larger measuring 7.8 x 9.2 cm and more inferiorly over the right lobe measuring 5.5 x 8.8 cm. Findings may be due to  infection/abscess in light of possible diverticular abscess as described below. Metastatic disease is also a concern as well as possible primary liver neoplasm. There is an indeterminate 1.8 cm hypodensity containing minimal fluid and air which abuts the superolateral aspect of the adjacent sigmoid colon and may represent evidence of diverticulitis with small diverticular abscess. Aortic atherosclerosis. Atherosclerotic coronary artery disease. 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 Recommend thyroid ultrasound for further evaluation. (Ref: J Am Coll Radiol. 2015 Feb;12(2): 143-50). 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 Few subcentimeter left renal cortical hypodensities, too small to characterize, but likely cysts. Aortic Atherosclerosis (ICD10-I70.0). MR Liver 10/2>>Two large complex cystic lesions in the posterior right hepatic lobe, highly suspicious for hepatic abscesses. Diffuse hepatic steatosis. Stable small benign left adrenal adenoma.  Micro Data:  COVID-19 10/2>>negative  Urine 10/2>>negative  GI panel 10/2>>negative  Blood x2 10/2>>negative>> Aerobic/Anaerobic culture hepatic abscess 10/3>>  Antimicrobials:  Vancomycin 10/2 x1 dose, 10/3>> Zosyn 10/2>>  Interim History / Subjective:  Pt tachycardic heart rate 120-130's and tachypneic respiratory rate 30-40.  She endorses shortness of breath, but she states this has been going on for about a week.   Objective   Blood pressure 102/86, pulse (!) 117, temperature 99.1 F (37.3 C), temperature source Oral, resp. rate (!) 44, SpO2 94 %.        Intake/Output Summary (Last 24 hours) at 11/03/2020 1807 Last data filed at 11/02/2020 1829 Gross per 24 hour  Intake 400 ml  Output --  Net 400 ml   There were no vitals filed for this visit.  Examination: General: acutely ill appearing female, in mild respiratory distress  HENT: supple, no JVD  Lungs: faint crackles throughout, tachypneic and slightly labored   Cardiovascular: sinus tachycardia, s1s2, no R/G, 2+ radial/1+ distal pulses, no edema  Abdomen: +BS x4, obese, soft, non tender, non distended, RLQ drains to gravity x2 draining bloody drainage  Extremities: normal bulk and tone, moves all extremities  Neuro: alert and oriented, follows commands, PERRLA GU: purewick in place   Resolved Hospital Problem list   N/A  Assessment & Plan:   Acute hypoxic respiratory failure secondary to metabolic acidosis and mild pulmonary vascular congestion  Hx: COPD and tobacco abuse  - Supplemental O2 for dyspnea and/or hypoxia  - Prn bronchodilator therapy  - Will need smoking cessation counseling   Sepsis with septic shock  Junctional tachycardia  Hx: Chronic diastolic CHF  - Continuous telemetry monitoring  - Gentle iv fluid resuscitation and prn neo-synephrine gtt to maintain map >65 - Hold outpatient antihypertensives and lasix for now   Hypomagnesia  Hypophosphatemia  Compensated metabolic acidosis and lactic acidosis  - Trend BMP, lactic acid, and vbg - 1 amp of sodium bicarb x1 dose  - Replace electrolytes as indicated  - Strict intake/output monitoring   Possible acute diverticulitis with two right posterior liver lobe abscesses s/p hepatic abscess aspiration/drain placement x2 10/3 - Trend WBC and monitor fever curve  - Trend PCT  - Continue abx as outlined above - Follow cultures  - Monitor hepatic abscess drainage output  - General surgery and interventional radiology consulted appreciate input   Polycythemia  vera  - Monitor CBC   Type II diabetes mellitus  - CBG's q4hrs  - SSI   Best Practice (right click and "Reselect all SmartList Selections" daily)   Diet/type: NPO w/ oral meds DVT prophylaxis: SCD GI prophylaxis: H2B Lines: N/A Foley:  N/A Code Status:  full code Last date of multidisciplinary goals of care discussion [N/A]  Labs   CBC: Recent Labs  Lab 11/02/20 1219 11/02/20 2141 11/03/20 0619  WBC  24.5* 21.9* 23.6*  NEUTROABS  --  16.8*  --   HGB 18.0* 15.0 14.9  HCT 48.1* 41.3 42.3  MCV 88.7 88.4 90.2  PLT 216 196 088    Basic Metabolic Panel: Recent Labs  Lab 11/02/20 1219 11/03/20 0619  NA 136 137  K 3.4* 3.2*  CL 103 102  CO2 21* 24  GLUCOSE 222* 111*  BUN 20 17  CREATININE 1.51* 0.94  CALCIUM 9.2 8.4*  MG  --  1.7   GFR: CrCl cannot be calculated (Unknown ideal weight.). Recent Labs  Lab 11/02/20 1219 11/02/20 1323 11/02/20 2127 11/02/20 2141 11/03/20 0619  PROCALCITON  --   --   --   --  38.72  WBC 24.5*  --   --  21.9* 23.6*  LATICACIDVEN  --  3.2* 2.2*  --   --     Liver Function Tests: Recent Labs  Lab 11/02/20 1219 11/03/20 0619  AST 64* 59*  ALT 77* 65*  ALKPHOS 135* 131*  BILITOT 2.9* 3.2*  PROT 8.0 7.1  ALBUMIN 3.1* 2.6*   Recent Labs  Lab 11/02/20 1219  LIPASE 17   No results for input(s): AMMONIA in the last 168 hours.  ABG No results found for: PHART, PCO2ART, PO2ART, HCO3, TCO2, ACIDBASEDEF, O2SAT   Coagulation Profile: Recent Labs  Lab 11/03/20 0619  INR 1.3*    Cardiac Enzymes: No results for input(s): CKTOTAL, CKMB, CKMBINDEX, TROPONINI in the last 168 hours.  HbA1C: Hgb A1c MFr Bld  Date/Time Value Ref Range Status  05/03/2015 04:23 AM 6.1 (H) 4.0 - 6.0 % Final    CBG: Recent Labs  Lab 11/02/20 2041 11/03/20 0328 11/03/20 0810 11/03/20 1134 11/03/20 1652  GLUCAP 122* 151* 131* 139* 123*    Review of Systems: Positives in BOLD   Gen: Denies fever, chills, weight change, fatigue, night sweats HEENT:blurred vision, double vision, hearing loss, tinnitus, sinus congestion, rhinorrhea, sore throat, neck stiffness, dysphagia PULM: shortness of breath, cough, sputum production, hemoptysis, wheezing CV: Denies chest pain, edema, orthopnea, paroxysmal nocturnal dyspnea, palpitations GI: abdominal pain, nausea, vomiting, diarrhea, hematochezia, melena, constipation, change in bowel habits GU: Denies dysuria,  hematuria, polyuria, oliguria, urethral discharge Endocrine: Denies hot or cold intolerance, polyuria, polyphagia or appetite change Derm: Denies rash, dry skin, scaling or peeling skin change Heme: Denies easy bruising, bleeding, bleeding gums Neuro: headache, numbness, weakness, slurred speech, loss of memory or consciousness  Past Medical History:  She,  has a past medical history of CHF (congestive heart failure) ([place]), COPD (chronic obstructive pulmonary disease) ([place]), Diabetes mellitus, type 2 ([place]), GERD (gastroesophageal reflux disease), Heart disease, Hypertension, Joint pain, Polycythemia vera ([place]), and Tobacco abuse.   Surgical History:  History reviewed. No pertinent surgical history.   Social History:   reports that she has been smoking cigarettes. She has a 10.00 pack-year smoking history. She has never used smokeless tobacco. She reports that she does not drink alcohol and does not use drugs.   Family History:  Her family history includes Arthritis in  her mother; Breast cancer in her sister and sister; Deep vein thrombosis in her mother; Diabetes in her mother and sister; Heart attack in her father; Heart disease in her mother and sister; Hypertension in her brother; Kidney cancer in her sister; Kidney disease in her sister.   Allergies No Known Allergies   Home Medications  Prior to Admission medications   Medication Sig Start Date End Date Taking? Authorizing Provider  aspirin 325 MG EC tablet Take 325 mg by mouth daily.   Yes [provider]  b complex vitamins tablet Take 1 tablet by mouth every other day.    Yes [provider]  COREG CR 80 MG 24 hr capsule Take 1 tablet by mouth daily. 11/20/14  Yes [provider]  furosemide (LASIX) 40 MG tablet Take 1 tablet by mouth daily. 11/19/14  Yes [provider]  gabapentin (NEURONTIN) 100 MG capsule Take 100-200 mg by mouth 3 (three) times daily. 10/20/20  Yes [provider]   lactobacillus acidophilus & bulgar (LACTINEX) chewable tablet Chew 1 tablet by mouth 3 (three) times daily with meals. 05/03/15  Yes Mody, Ulice Bold, MD  losartan (COZAAR) 25 MG tablet Take 1 tablet by mouth daily. 12/03/14  Yes [provider]  metFORMIN (GLUCOPHAGE) 500 MG tablet Take 1 tablet by mouth daily with breakfast. 11/19/14  Yes [provider]  Multiple Vitamins-Minerals (MULTIVITAL PO) Take 1 tablet by mouth daily.   Yes [provider]  pantoprazole (PROTONIX) 40 MG tablet Take 1 tablet by mouth daily. 11/19/14  Yes [provider]  potassium chloride SA (K-DUR,KLOR-CON) 20 MEQ tablet Take 1 tablet by mouth daily. 11/20/14  Yes [provider]  spironolactone (ALDACTONE) 25 MG tablet Take 1 tablet by mouth daily. 11/19/14  Yes [provider]     Critical care time: 1 hour       Rosilyn Mings, [place] Pager [phone] (please enter 7 digits) PCCM Consult Pager [phone] (please enter 7 digits)

## 2020-11-03 NOTE — Consult Note (Signed)
Pharmacy Antibiotic Note  Stacy Cox is a 65 y.o. female admitted on 11/02/2020 with  complaints of shortness of breath and palpitations.  CT abdomen and pelvis showed liver lesions concerning for infection/abscess as well as possible acute diverticulitis with abscess. Pharmacy has been consulted for zosyn and vancomycin dosing.   Plan: -Zosyn 3.375 gm IV q8h (4 hour infusion)  -Patient received Vancomycin 2000 mg IV x 1 on 11/02/20. Will continue with Vancomycin 1500 mg IV Q 24 hrs. Goal AUC 400-550. Expected AUC: 475 SCr used: 0.94 Cmin 10.3   *(Used weight from office visit 10/07/20 of 112.9 kg and height 36ft8in)-  follow up for changes  -watch renal fxn on Zosyn/Vanc  Monitor clinical picture and renal function F/U C&S, abx deescalation / LOT     Temp (24hrs), Avg:101.2 F (38.4 C), Min:99.1 F (37.3 C), Max:103.7 F (39.8 C)  Recent Labs  Lab 11/02/20 1219 11/02/20 1323 11/02/20 2127 11/02/20 2141 11/03/20 0619 11/03/20 1804  WBC 24.5*  --   --  21.9* 23.6* 13.0*  CREATININE 1.51*  --   --   --  0.94  --   LATICACIDVEN  --  3.2* 2.2*  --   --   --      CrCl cannot be calculated (Unknown ideal weight.).    No Known Allergies  Antimicrobials this admission: 10/02 vancomycin >> 10/02 Zosyn >>   Dose adjustments this admission: N/A  Microbiology results: 10/02 BCx:NG<24hrs 10/02 UCx: NG 10/02 GI Panel: Pending 10/3 liver abcess cx:  Thank you for allowing pharmacy to be a part of this patient's care.  Oriah Leinweber A, PharmD 11/03/2020 7:45 PM

## 2020-11-03 NOTE — ED Notes (Signed)
MD at bedside assessing pt and putting in orders to control HR and raise BP.

## 2020-11-03 NOTE — ED Notes (Addendum)
Pt arrived back from CT visibly shaking, tachycardic, and tachypnic.  Pt having trouble verbalizing at this time. MD notified.

## 2020-11-03 NOTE — ED Notes (Signed)
MD at bedside at this time. Pt reports tired. SHOB, CP w/coughing

## 2020-11-03 NOTE — Progress Notes (Signed)
PHARMACIST - PHYSICIAN COMMUNICATION  CONCERNING:  Enoxaparin (Lovenox) for DVT Prophylaxis    RECOMMENDATION: Patient was prescribed enoxaprin 40mg  q24 hours for VTE prophylaxis.   Filed Weights   11/03/20 2015  Weight: 111.7 kg (246 lb 4.1 oz)    Body mass index is 37.44 kg/m.  Estimated Creatinine Clearance: 81.3 mL/min (by C-G formula based on SCr of 0.94 mg/dL).   Based on [place] patient is candidate for enoxaparin 0.5mg /kg TBW SQ every 24 hours based on BMI being >30.  DESCRIPTION: Pharmacy has adjusted enoxaparin dose per [facility] policy.  Patient is now receiving enoxaparin 0.5 mg/kg every 24 hours    Dallie Piles, PharmD Clinical Pharmacist  11/03/2020 8:59 PM

## 2020-11-03 NOTE — ED Notes (Signed)
Patient transported to ICU room 14 with RN and monitor.  Patient alert and oriented during transport.

## 2020-11-03 NOTE — Progress Notes (Signed)
Patient ID: Stacy Cox, female   DOB: 03-15-1955, 65 y.o.   MRN: [id number] [facility] PROGRESS NOTE  Akhila Mahnken KAJ:[id number] DOB: Jun 08, 1955 DOA: 11/02/2020 PCP: Donnie Coffin, MD  HPI/Subjective: Patient coming in with shortness of breath chest pain abdominal pain and not feeling well.  Found to have diverticulitis with liver abscesses.  Objective: Vitals:   11/03/20 1130 11/03/20 1230  BP: 110/72 117/78  Pulse: (!) 114 (!) 101  Resp: 20 (!) 30  Temp:    SpO2: 96% 97%    Intake/Output Summary (Last 24 hours) at 11/03/2020 1301 Last data filed at 11/02/2020 1829 Gross per 24 hour  Intake 1500 ml  Output --  Net 1500 ml   There were no vitals filed for this visit.  ROS: Review of Systems  Respiratory:  Positive for shortness of breath.   Cardiovascular:  Positive for chest pain.  Gastrointestinal:  Positive for abdominal pain and nausea. Negative for vomiting.  Exam: Physical Exam HENT:     Head: Normocephalic.     Mouth/Throat:     Pharynx: No oropharyngeal exudate.  Eyes:     General: Lids are normal.     Conjunctiva/sclera: Conjunctivae normal.  Cardiovascular:     Rate and Rhythm: Normal rate and regular rhythm.     Heart sounds: Normal heart sounds, S1 normal and S2 normal.  Pulmonary:     Breath sounds: Normal breath sounds. No decreased breath sounds, wheezing, rhonchi or rales.  Abdominal:     Palpations: Abdomen is soft.     Tenderness: There is abdominal tenderness in the right upper quadrant and left lower quadrant.  Musculoskeletal:     Right lower leg: No swelling.     Left lower leg: No swelling.  Skin:    General: Skin is warm.     Findings: No rash.  Neurological:     Mental Status: She is alert and oriented to person, place, and time.      Scheduled Meds:  carvedilol  3.125 mg Oral BID WC   insulin aspart  0-15 Units Subcutaneous Q4H   Continuous Infusions:  lactated ringers with kcl 65 mL/hr at 11/03/20 0834    piperacillin-tazobactam (ZOSYN)  IV Stopped (11/03/20 0700)    Assessment/Plan:  Clinical sepsis, present on admission with acute diverticulitis with liver abscesses.  Patient with leukocytosis white blood cell count of 24.5, fever of 103, tachycardic up to the 140s, tachypneic.  Case discussed with interventional radiology and they will plan on draining for liver abscesses today.  General surgery following.  Continue IV Zosyn.  IV fluids. Essential hypertension, chronic diastolic congestive heart failure.  With blood pressure being on the lower side will be able to do low-dose Coreg at this point.  Holding losartan.  Watch closely while on IV fluids. Polycythemia vera.  Last hemoglobin 14.9 Type 2 diabetes mellitus with neuropathy.  Continue gabapentin Lactic acidosis hold metformin Hypokalemia add potassium to IV fluids   Code Status:     Code Status Orders  (From admission, onward)           Start     Ordered   11/02/20 1957  Full code  Continuous        11/02/20 1958           Code Status History     Date Active Date Inactive Code Status Order ID Comments User Context   05/03/2015 0738 05/03/2015 1648 Full Code [id number]  Zielke, Floyce Stakes, RN Inpatient   05/03/2015  0130 05/03/2015 0736 DNR [id number]  Sylvan Cheese, MD ED      Family Communication: Patient declined me calling family today Disposition Plan: Status is: Inpatient  Dispo: The patient is from: Home              Anticipated d/c is to: Home              Patient currently to go for IR drainage of liver abscess today.  Requires IV antibiotics for sepsis with diverticulitis and liver abscesses.   Difficult to place patient.  No.  Consultants: Interventional radiology General surgery  Antibiotics: Zosyn  Time spent: 28 minutes  [place]

## 2020-11-04 ENCOUNTER — Inpatient Hospital Stay
Admit: 2020-11-04 | Discharge: 2020-11-04 | Disposition: A | Discharge status: Home / Self Care | Payer: Medicare HMO | Attending: Cardiovascular Disease | Admitting: Cardiovascular Disease

## 2020-11-04 DIAGNOSIS — K5792 Diverticulitis of intestine, part unspecified, without perforation or abscess without bleeding: Secondary | ICD-10-CM | POA: Diagnosis not present

## 2020-11-04 DIAGNOSIS — K750 Abscess of liver: Secondary | ICD-10-CM | POA: Diagnosis not present

## 2020-11-04 DIAGNOSIS — D45 Polycythemia vera: Secondary | ICD-10-CM | POA: Diagnosis not present

## 2020-11-04 DIAGNOSIS — R710 Precipitous drop in hematocrit: Secondary | ICD-10-CM

## 2020-11-04 DIAGNOSIS — A419 Sepsis, unspecified organism: Secondary | ICD-10-CM | POA: Diagnosis not present

## 2020-11-04 DIAGNOSIS — I480 Paroxysmal atrial fibrillation: Secondary | ICD-10-CM

## 2020-11-04 LAB — ECHOCARDIOGRAM COMPLETE
AR max vel: 3.92 cm2
AV Area VTI: 3.76 cm2
AV Area mean vel: 4.46 cm2
AV Mean grad: 3 mmHg
AV Peak grad: 7.2 mmHg
Ao pk vel: 1.34 m/s
Area-P 1/2: 3.63 cm2
Height: 68 in
MV VTI: 4.76 cm2
S' Lateral: 4.1 cm
Weight: 3940.06 oz

## 2020-11-04 LAB — GLUCOSE, CAPILLARY
Glucose-Capillary: 101 mg/dL — ABNORMAL HIGH (ref 70–99)
Glucose-Capillary: 104 mg/dL — ABNORMAL HIGH (ref 70–99)
Glucose-Capillary: 125 mg/dL — ABNORMAL HIGH (ref 70–99)
Glucose-Capillary: 131 mg/dL — ABNORMAL HIGH (ref 70–99)
Glucose-Capillary: 150 mg/dL — ABNORMAL HIGH (ref 70–99)
Glucose-Capillary: 155 mg/dL — ABNORMAL HIGH (ref 70–99)
Glucose-Capillary: 155 mg/dL — ABNORMAL HIGH (ref 70–99)

## 2020-11-04 LAB — COMPREHENSIVE METABOLIC PANEL
ALT: 61 U/L — ABNORMAL HIGH (ref 0–44)
AST: 66 U/L — ABNORMAL HIGH (ref 15–41)
Albumin: 2.1 g/dL — ABNORMAL LOW (ref 3.5–5.0)
Alkaline Phosphatase: 106 U/L (ref 38–126)
Anion gap: 7 (ref 5–15)
BUN: 18 mg/dL (ref 8–23)
CO2: 26 mmol/L (ref 22–32)
Calcium: 7.7 mg/dL — ABNORMAL LOW (ref 8.9–10.3)
Chloride: 101 mmol/L (ref 98–111)
Creatinine, Ser: 0.8 mg/dL (ref 0.44–1.00)
GFR, Estimated: >60 mL/min (ref >60)
Glucose, Bld: 152 mg/dL — ABNORMAL HIGH (ref 70–99)
Potassium: 3.4 mmol/L — ABNORMAL LOW (ref 3.5–5.1)
Sodium: 134 mmol/L — ABNORMAL LOW (ref 135–145)
Total Bilirubin: 3.3 mg/dL — ABNORMAL HIGH (ref 0.3–1.2)
Total Protein: 5.3 g/dL — ABNORMAL LOW (ref 6.5–8.1)

## 2020-11-04 LAB — CBC WITH DIFFERENTIAL/PLATELET
Abs Immature Granulocytes: 0.53 10*3/uL — ABNORMAL HIGH (ref 0.00–0.07)
Basophils Absolute: 0 10*3/uL (ref 0.0–0.1)
Basophils Relative: 0 %
Eosinophils Absolute: 0.1 10*3/uL (ref 0.0–0.5)
Eosinophils Relative: 0 %
HCT: 34.5 % — ABNORMAL LOW (ref 36.0–46.0)
Hemoglobin: 12.6 g/dL (ref 12.0–15.0)
Immature Granulocytes: 2 %
Lymphocytes Relative: 7 %
Lymphs Abs: 2 10*3/uL (ref 0.7–4.0)
MCH: 32.2 pg (ref 26.0–34.0)
MCHC: 36.5 g/dL — ABNORMAL HIGH (ref 30.0–36.0)
MCV: 88.2 fL (ref 80.0–100.0)
Monocytes Absolute: 1.9 10*3/uL — ABNORMAL HIGH (ref 0.1–1.0)
Monocytes Relative: 7 %
Neutro Abs: 22.4 10*3/uL — ABNORMAL HIGH (ref 1.7–7.7)
Neutrophils Relative %: 84 %
Platelets: 167 10*3/uL (ref 150–400)
RBC: 3.91 MIL/uL (ref 3.87–5.11)
RDW: 14.3 % (ref 11.5–15.5)
Smear Review: NORMAL
WBC: 26.9 10*3/uL — ABNORMAL HIGH (ref 4.0–10.5)
nRBC: 0 % (ref 0.0–0.2)

## 2020-11-04 LAB — BLOOD CULTURE ID PANEL (REFLEXED) - BCID2

## 2020-11-04 LAB — PHOSPHORUS: Phosphorus: 4.4 mg/dL (ref 2.5–4.6)

## 2020-11-04 LAB — LACTIC ACID, PLASMA: Lactic Acid, Venous: 1.5 mmol/L (ref 0.5–1.9)

## 2020-11-04 LAB — MAGNESIUM: Magnesium: 2.4 mg/dL (ref 1.7–2.4)

## 2020-11-04 LAB — HEMOGLOBIN: Hemoglobin: 13.2 g/dL (ref 12.0–15.0)

## 2020-11-04 LAB — PROCALCITONIN: Procalcitonin: 75.94 ng/mL

## 2020-11-04 LAB — LACTATE DEHYDROGENASE: LDH: 220 U/L — ABNORMAL HIGH (ref 98–192)

## 2020-11-04 MED ORDER — POTASSIUM CHLORIDE CRYS ER 20 MEQ PO TBCR
40.0000 meq | EXTENDED_RELEASE_TABLET | Freq: Once | ORAL | Status: AC
  Administered 2020-11-04: 40 meq via ORAL
  Filled 2020-11-04: qty 2

## 2020-11-04 MED ORDER — AMIODARONE HCL 200 MG PO TABS
200.0000 mg | ORAL_TABLET | Freq: Every day | ORAL | Status: DC
  Administered 2020-11-04 – 2020-11-07 (×4): 200 mg via ORAL
  Filled 2020-11-04 (×5): qty 1

## 2020-11-04 MED ORDER — SODIUM CHLORIDE 0.9% FLUSH
5.0000 mL | Freq: Three times a day (TID) | INTRAVENOUS | Status: DC
  Administered 2020-11-04 – 2020-11-07 (×10): 5 mL

## 2020-11-04 MED ORDER — APIXABAN 5 MG PO TABS
5.0000 mg | ORAL_TABLET | Freq: Two times a day (BID) | ORAL | Status: DC
  Administered 2020-11-04: 5 mg via ORAL
  Filled 2020-11-04: qty 1

## 2020-11-04 NOTE — Progress Notes (Signed)
Referring Physician(s): Judd Gaudier, MD  Supervising Physician: Juliet Rude  Patient Status:  [facility] - In-pt  Reason for visit: Hepatic abscesses  S/p placement of 2 percutaneous drainage catheters into the dominant collections within the right lobe on 10/3  Subjective: Patient states her pain has improved since the drains were placed, does complain of tenderness at drain sites.   Allergies: Patient has no known allergies.  Medications: Prior to Admission medications   Medication Sig Start Date End Date Taking? Authorizing Provider  aspirin 325 MG EC tablet Take 325 mg by mouth daily.   Yes [provider]  b complex vitamins tablet Take 1 tablet by mouth every other day.    Yes [provider]  COREG CR 80 MG 24 hr capsule Take 1 tablet by mouth daily. 11/20/14  Yes [provider]  furosemide (LASIX) 40 MG tablet Take 1 tablet by mouth daily. 11/19/14  Yes [provider]  gabapentin (NEURONTIN) 100 MG capsule Take 100-200 mg by mouth 3 (three) times daily. 10/20/20  Yes [provider]  lactobacillus acidophilus & bulgar (LACTINEX) chewable tablet Chew 1 tablet by mouth 3 (three) times daily with meals. 05/03/15  Yes Mody, Ulice Bold, MD  losartan (COZAAR) 25 MG tablet Take 1 tablet by mouth daily. 12/03/14  Yes [provider]  metFORMIN (GLUCOPHAGE) 500 MG tablet Take 1 tablet by mouth daily with breakfast. 11/19/14  Yes [provider]  Multiple Vitamins-Minerals (MULTIVITAL PO) Take 1 tablet by mouth daily.   Yes [provider]  pantoprazole (PROTONIX) 40 MG tablet Take 1 tablet by mouth daily. 11/19/14  Yes [provider]  potassium chloride SA (K-DUR,KLOR-CON) 20 MEQ tablet Take 1 tablet by mouth daily. 11/20/14  Yes [provider]  spironolactone (ALDACTONE) 25 MG tablet Take 1 tablet by mouth daily. 11/19/14  Yes [provider]   Vital Signs: BP 109/82   Pulse 69    Temp 97.9 F (36.6 C) (Oral)   Resp (!) 26   Ht 5\' 8"  (1.727 m)   Wt 246 lb 4.1 oz (111.7 kg)   SpO2 97%   BMI 37.44 kg/m   Physical Exam  General: Alert, NAD Abd: Soft, minimal TTP, 2 RUQ drains with dressing C/D/I, bloody output in gravity bags   Output by Drain (mL) 11/02/20 0701 - 11/02/20 1900 11/02/20 1901 - 11/03/20 0700 11/03/20 0701 - 11/03/20 1900 11/03/20 1901 - 11/04/20 0700 11/04/20 0701 - 11/04/20 1603  Open Drain 1 Lateral;Right RLQ     70  Other drain with 125 mL output   Imaging: DG Chest 2 View  Result Date: 11/02/2020 CLINICAL DATA:  Shortness of breath EXAM: CHEST - 2 VIEW COMPARISON:  May 02, 2015 FINDINGS: The heart, hila, and mediastinum are normal. No pneumothorax. No nodules or masses. No focal infiltrates or overt edema. IMPRESSION: No active cardiopulmonary disease. Electronically Signed   By: Dorise Bullion III M.D.   On: 11/02/2020 13:02   CT Angio Chest PE W and/or Wo Contrast  Result Date: 11/02/2020 CLINICAL DATA:  Tachycardia. Abdominal pain. Suspect pulmonary embolism. EXAM: CT ANGIOGRAPHY CHEST CT ABDOMEN AND PELVIS WITH CONTRAST TECHNIQUE: Multidetector CT imaging of the chest was performed using the standard protocol during bolus administration of intravenous contrast. Multiplanar CT image reconstructions and MIPs were obtained to evaluate the vascular anatomy. Multidetector CT imaging of the abdomen and pelvis was performed using the standard protocol during bolus administration of intravenous contrast. CONTRAST:  71mL OMNIPAQUE IOHEXOL  350 MG/ML SOLN COMPARISON:  CT abdomen/pelvis 05/02/2015 and CT chest 02/14/2008 FINDINGS: CTA CHEST FINDINGS Cardiovascular: Heart is normal size. Calcified plaque over the left main and 3 vessel coronary arteries. Thoracic aorta is normal in caliber. Pulmonary arterial system is well opacified without evidence of emboli. Mediastinum/Nodes: No mediastinal or hilar adenopathy. 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 Lungs/Pleura: Lungs are well inflated without focal airspace consolidation or effusion. Airways are normal. Musculoskeletal: Mild degenerative change of the spine. Review of the MIP images confirms the above findings. CT ABDOMEN and PELVIS FINDINGS Hepatobiliary: There are 2 adjacent masslike areas of low attenuation over the right lobe of the liver measuring 7.8 x 9.2 cm and more inferiorly over the right lobe measuring 5.5 x 8.8 cm. Gallbladder is somewhat contracted. Biliary tree is normal. Pancreas: Normal. Spleen: Normal. Adrenals/Urinary Tract: Right adrenal gland is normal. 3.1 cm left adrenal mass without significant change. Kidneys are normal in size without hydronephrosis or nephrolithiasis. Couple subcentimeter left renal cortical hypodensities too small to characterize but likely cysts. Ureters and bladder are normal. Stomach/Bowel: Stomach and small bowel are normal. Appendix is normal. Minimal diverticulosis of the colon. There is a rounded 1.8 cm hypodensity containing minimal fluid and air which abuts the superolateral aspect of the adjacent sigmoid colon and may represent evidence of diverticulitis with small diverticular abscess. This structure also abuts the anterior aspect of the adjacent left ovary. Vascular/Lymphatic: Abdominal aorta is normal caliber with mild calcified plaque present. No adenopathy. Reproductive: Uterus and right ovary are normal. Left ovary likely normal although does abut an indeterminate structure as described above. Other: None. Musculoskeletal: No focal abnormality. IMPRESSION: 1. No evidence of pulmonary embolism and no acute cardiopulmonary disease. 2. Two adjacent masslike areas of low attenuation over the right lobe of the liver with the larger measuring 7.8 x 9.2 cm and more inferiorly over the right lobe measuring 5.5 x 8.8 cm. Findings may be due to infection/abscess in light of possible diverticular abscess as described below.  Metastatic disease is also a concern as well as possible primary liver neoplasm. 3. There is an indeterminate 1.8 cm hypodensity containing minimal fluid and air which abuts the superolateral aspect of the adjacent sigmoid colon and may represent evidence of diverticulitis with small diverticular abscess. 4. Aortic atherosclerosis. Atherosclerotic coronary artery disease. 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 Recommend thyroid ultrasound for further evaluation. (Ref: J Am Coll Radiol. 2015 Feb;12(2): 143-50). 6. 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 Few subcentimeter left renal cortical hypodensities, too small to characterize, but likely cysts. Aortic Atherosclerosis (ICD10-I70.0). Electronically Signed   By: Marin Olp M.D.   On: 11/02/2020 15:01   [place] Intraoperative  Result Date: 11/03/2020 CLINICAL DATA:  Ultrasound was provided for use by the ordering physician.  No provider Interpretation or professional fees incurred.    CT ABDOMEN PELVIS W CONTRAST  Result Date: 11/02/2020 CLINICAL DATA:  Tachycardia. Abdominal pain. Suspect pulmonary embolism. EXAM: CT ANGIOGRAPHY CHEST CT ABDOMEN AND PELVIS WITH CONTRAST TECHNIQUE: Multidetector CT imaging of the chest was performed using the standard protocol during bolus administration of intravenous contrast. Multiplanar CT image reconstructions and MIPs were obtained to evaluate the vascular anatomy. Multidetector CT imaging of the abdomen and pelvis was performed using the standard protocol during bolus administration of intravenous contrast. CONTRAST:  60mL OMNIPAQUE IOHEXOL 350 MG/ML SOLN COMPARISON:  CT abdomen/pelvis 05/02/2015 and CT chest 02/14/2008 FINDINGS: CTA CHEST FINDINGS Cardiovascular: Heart  is normal size. Calcified plaque over the left main and 3 vessel coronary arteries. Thoracic aorta is normal in caliber. Pulmonary arterial system is well opacified without evidence of emboli. Mediastinum/Nodes: No mediastinal or  hilar adenopathy. 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 Lungs/Pleura: Lungs are well inflated without focal airspace consolidation or effusion. Airways are normal. Musculoskeletal: Mild degenerative change of the spine. Review of the MIP images confirms the above findings. CT ABDOMEN and PELVIS FINDINGS Hepatobiliary: There are 2 adjacent masslike areas of low attenuation over the right lobe of the liver measuring 7.8 x 9.2 cm and more inferiorly over the right lobe measuring 5.5 x 8.8 cm. Gallbladder is somewhat contracted. Biliary tree is normal. Pancreas: Normal. Spleen: Normal. Adrenals/Urinary Tract: Right adrenal gland is normal. 3.1 cm left adrenal mass without significant change. Kidneys are normal in size without hydronephrosis or nephrolithiasis. Couple subcentimeter left renal cortical hypodensities too small to characterize but likely cysts. Ureters and bladder are normal. Stomach/Bowel: Stomach and small bowel are normal. Appendix is normal. Minimal diverticulosis of the colon. There is a rounded 1.8 cm hypodensity containing minimal fluid and air which abuts the superolateral aspect of the adjacent sigmoid colon and may represent evidence of diverticulitis with small diverticular abscess. This structure also abuts the anterior aspect of the adjacent left ovary. Vascular/Lymphatic: Abdominal aorta is normal caliber with mild calcified plaque present. No adenopathy. Reproductive: Uterus and right ovary are normal. Left ovary likely normal although does abut an indeterminate structure as described above. Other: None. Musculoskeletal: No focal abnormality. IMPRESSION: 1. No evidence of pulmonary embolism and no acute cardiopulmonary disease. 2. Two adjacent masslike areas of low attenuation over the right lobe of the liver with the larger measuring 7.8 x 9.2 cm and more inferiorly over the right lobe measuring 5.5 x 8.8 cm. Findings may be due to infection/abscess in light of  possible diverticular abscess as described below. Metastatic disease is also a concern as well as possible primary liver neoplasm. 3. There is an indeterminate 1.8 cm hypodensity containing minimal fluid and air which abuts the superolateral aspect of the adjacent sigmoid colon and may represent evidence of diverticulitis with small diverticular abscess. 4. Aortic atherosclerosis. Atherosclerotic coronary artery disease. 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 Recommend thyroid ultrasound for further evaluation. (Ref: J Am Coll Radiol. 2015 Feb;12(2): 143-50). 6. 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 Few subcentimeter left renal cortical hypodensities, too small to characterize, but likely cysts. Aortic Atherosclerosis (ICD10-I70.0). Electronically Signed   By: Marin Olp M.D.   On: 11/02/2020 15:01   MR LIVER W WO CONTRAST  Result Date: 11/02/2020 CLINICAL DATA:  Hepatic and left adrenal masses on recent CT. Abdominal pain. Diverticulitis. EXAM: MRI ABDOMEN WITHOUT AND WITH CONTRAST TECHNIQUE: Multiplanar multisequence MR imaging of the abdomen was performed both before and after the administration of intravenous contrast. CONTRAST:  28mL GADAVIST GADOBUTROL 1 MMOL/ML IV SOLN COMPARISON:  CT on 11/02/2020 and 05/02/2015 FINDINGS: Lower chest: No acute findings. Hepatobiliary: Image degradation by motion artifact noted. Diffuse hepatic steatosis is noted. Two adjacent complex cystic lesions are seen in the posterior right hepatic lobe which measure 7.0 cm and 8.6 cm in maximum diameter. These both show numerous thin internal septations and lack of solid component, highly suspicious for hepatic abscesses. Gallbladder is unremarkable. No evidence of biliary ductal dilatation. Pancreas:  No mass or inflammatory changes. Spleen:  Within normal limits in size and appearance. Adrenals/Urinary Tract:  A small left adrenal mass remains stable compared to CT in 2017, and shows signal dropout on  chemical shift imaging, consistent with benign adrenal adenoma. Tiny left renal cyst is noted. No renal masses identified. No evidence of hydronephrosis. Stomach/Bowel: Visualized portion unremarkable. Vascular/Lymphatic: No pathologically enlarged lymph nodes identified. No acute vascular findings. Other:  None. Musculoskeletal:  No suspicious bone lesions identified. IMPRESSION: Two large complex cystic lesions in the posterior right hepatic lobe, highly suspicious for hepatic abscesses. Diffuse hepatic steatosis. Stable small benign left adrenal adenoma. Electronically Signed   By: Marlaine Hind M.D.   On: 11/02/2020 19:37   DG Chest Port 1 View  Result Date: 11/03/2020 CLINICAL DATA:  Acute respiratory failure. EXAM: PORTABLE CHEST 1 VIEW COMPARISON:  CTA chest and chest x-ray from yesterday. FINDINGS: The heart size and mediastinal contours are within normal limits. New mild pulmonary vascular congestion. Low lung volumes with mild bibasilar atelectasis. New possible trace right pleural effusion. No pneumothorax. No acute osseous abnormality. IMPRESSION: 1. New mild pulmonary vascular congestion and possible trace right pleural effusion. Electronically Signed   By: Titus Dubin M.D.   On: 11/03/2020 18:46   ECHOCARDIOGRAM COMPLETE  Result Date: 11/04/2020    ECHOCARDIOGRAM REPORT   Patient Name:   Stacy Cox Date of Exam: 11/04/2020 Medical Rec #:  [id number]     Height:       68.0 in Accession #:    [id number]    Weight:       246.3 lb Date of Birth:  1955-08-01     BSA:          2.233 m Patient Age:    13 years      BP:           101/74 mmHg Patient Gender: F             HR:           95 bpm. Exam Location:  [facility] Procedure: 2D Echo, Color Doppler and Cardiac Doppler Indications:     I48.91 Atrial fibrillation  History:         Patient has no prior history of Echocardiogram examinations.                  CHF, COPD; Risk Factors:Hypertension, Diabetes and Current                  Smoker.   Sonographer:     Charmayne Sheer Referring Phys:  [place] Diagnosing Phys: Neoma Laming  Sonographer Comments: Suboptimal parasternal window and suboptimal subcostal window. IMPRESSIONS  1. Left ventricular ejection fraction, by estimation, is 25 to 30%. The left ventricle has severely decreased function. The left ventricle demonstrates global hypokinesis. The left ventricular internal cavity size was severely dilated. There is mild concentric left ventricular hypertrophy. Left ventricular diastolic parameters are consistent with Grade I diastolic dysfunction (impaired relaxation).  2. Right ventricular systolic function is moderately reduced. The right ventricular size is mildly enlarged. Mildly increased right ventricular wall thickness.  3. Left atrial size was severely dilated.  4. Right atrial size was severely dilated.  5. The mitral valve is grossly normal. Trivial mitral valve regurgitation.  6. The aortic valve is grossly normal. Aortic valve regurgitation is not visualized. Conclusion(s)/Recommendation(s): Findings consistent with dilated cardiomyopathy. FINDINGS  Left Ventricle: Left ventricular ejection fraction, by estimation, is 25 to 30%. The left ventricle has severely decreased function. The left ventricle demonstrates global hypokinesis. The left ventricular internal cavity  size was severely dilated. There is mild concentric left ventricular hypertrophy. Left ventricular diastolic parameters are consistent with Grade I diastolic dysfunction (impaired relaxation). Right Ventricle: The right ventricular size is mildly enlarged. Mildly increased right ventricular wall thickness. Right ventricular systolic function is moderately reduced. Left Atrium: Left atrial size was severely dilated. Right Atrium: Right atrial size was severely dilated. Pericardium: There is no evidence of pericardial effusion. Mitral Valve: The mitral valve is grossly normal. Trivial mitral valve regurgitation. MV peak  gradient, 3.6 mmHg. The mean mitral valve gradient is 2.5 mmHg. Tricuspid Valve: The tricuspid valve is grossly normal. Tricuspid valve regurgitation is trivial. Aortic Valve: The aortic valve is grossly normal. Aortic valve regurgitation is not visualized. Aortic valve mean gradient measures 3.0 mmHg. Aortic valve peak gradient measures 7.2 mmHg. Aortic valve area, by VTI measures 3.76 cm. Pulmonic Valve: The pulmonic valve was grossly normal. Pulmonic valve regurgitation is trivial. Aorta: The aortic root, ascending aorta and aortic arch are all structurally normal, with no evidence of dilitation or obstruction. IAS/Shunts: No atrial level shunt detected by color flow Doppler. There is no evidence of a patent foramen ovale. No ventricular septal defect is seen or detected. There is no evidence of an atrial septal defect.  LEFT VENTRICLE PLAX 2D LVIDd:         4.69 cm  Diastology LVIDs:         4.10 cm  LV e' medial:    5.98 cm/s LV PW:         1.11 cm  LV E/e' medial:  13.3 LV IVS:        0.92 cm  LV e' lateral:   7.72 cm/s LVOT diam:     2.50 cm  LV E/e' lateral: 10.3 LV SV:         106 LV SV Index:   47 LVOT Area:     4.91 cm  RIGHT VENTRICLE RV Basal diam:  3.34 cm RV S prime:     8.38 cm/s LEFT ATRIUM             Index       RIGHT ATRIUM           Index LA diam:        3.90 cm 1.75 cm/m  RA Area:     12.00 cm LA Vol (A2C):   60.8 ml 27.23 ml/m RA Volume:   25.70 ml  11.51 ml/m LA Vol (A4C):   31.5 ml 14.11 ml/m LA Biplane Vol: 45.0 ml 20.16 ml/m  AORTIC VALVE                   PULMONIC VALVE AV Area (Vmax):    3.92 cm    PV Vmax:          0.93 m/s AV Area (Vmean):   4.46 cm    PV Vmean:         64.200 cm/s AV Area (VTI):     3.76 cm    PV VTI:           0.185 m AV Vmax:           134.00 cm/s PV Peak grad:     3.5 mmHg AV Vmean:          83.000 cm/s PV Mean grad:     2.0 mmHg AV VTI:            0.282 m     PR End Diast Vel: 7.51 msec  AV Peak Grad:      7.2 mmHg AV Mean Grad:      3.0 mmHg LVOT Vmax:          107.00 cm/s LVOT Vmean:        75.400 cm/s LVOT VTI:          0.216 m LVOT/AV VTI ratio: 0.77  AORTA Ao Root diam: 2.90 cm MITRAL VALVE MV Area (PHT): 3.63 cm    SHUNTS MV Area VTI:   4.76 cm    Systemic VTI:  0.22 m MV Peak grad:  3.6 mmHg    Systemic Diam: 2.50 cm MV Mean grad:  2.5 mmHg MV Vmax:       0.94 m/s MV Vmean:      65.4 cm/s MV Decel Time: 209 msec MV E velocity: 79.70 cm/s MV A velocity: 90.80 cm/s MV E/A ratio:  0.88 Shaukat Khan Electronically signed by Neoma Laming Signature Date/Time: 11/04/2020/1:34:20 PM    Final    CT IMAGE GUIDED DRAINAGE BY PERCUTANEOUS CATHETER  Result Date: 11/04/2020 INDICATION: Concern for hepatic abscess. Please perform CT-guided aspiration versus drainage catheter placement for infection source control purposes. EXAM: CT-GUIDED HEPATIC ABSCESS DRAINAGE CATHETER PLACEMENT X2 COMPARISON:  CT abdomen and pelvis-11/02/2020 MEDICATIONS: The patient is currently admitted to the hospital and receiving intravenous antibiotics. The antibiotics were administered within an appropriate time frame prior to the initiation of the procedure. ANESTHESIA/SEDATION: Moderate (conscious) sedation was employed during this procedure. A total of Versed 2 mg and Fentanyl 100 mcg was administered intravenously. Moderate Sedation Time: 27 minutes. The patient's level of consciousness and vital signs were monitored continuously by radiology nursing throughout the procedure under my direct supervision. CONTRAST:  None COMPLICATIONS: SIR LEVEL B - Normal therapy, includes overnight admission for observation. Patient developed postprocedural rigors requiring administration of IV demerol. PROCEDURE: Informed written consent was obtained from the patient after a discussion of the risks, benefits and alternatives to treatment. The patient was placed supine, slightly LPO on the CT gantry and a pre procedural CT was performed re-demonstrating the known abscess/fluid collections within the right  lobe of the liver with dominant collection within the more cranial aspect of the right lobe of the liver measuring approximately 8.4 x 5.8 cm (image 21, series 2) and additional collection within the more caudal aspect of the right lobe of the liver measuring approximately 9.6 x 7.1 cm (image 34, series 2) however both collections were suboptimally visualized on acquired noncontrast images. Both collections were identified sonographically however again, both collections were suboptimally visualized secondary to patient body habitus and extensive hepatic steatosis. The procedure was planned. A timeout was performed prior to the initiation of the procedure. The skin overlying the right upper abdomen was prepped and draped in the usual sterile fashion. The overlying soft tissues were anesthetized with 1% lidocaine with epinephrine. Under direct ultrasound guidance, the dominant collection more caudal aspect of the right lobe of the liver was targeted with an 18 gauge trocar needle. Multiple ultrasound images were saved procedural documentation purposes. A small amount of purulent fluid was aspirated and a short Amplatz wire was coiled within the collection. Appropriate positioning was confirmed with CT imaging. Next, the track was dilated ultimately allowing placement of a 12 [place] all-purpose drainage catheter with end coiled and locked within the dominant hepatic abscess. Approximately 120 cc of bloody purulent fluid was aspirated from the collection. Postprocedural imaging demonstrates appropriate positioning of the initial drainage catheter with unchanged appearance of the additional collection  within the more cranial aspect the right lobe of the liver. As such, decision was made to proceed with image guided placement of an additional hepatic abscess drainage catheter. Under direct ultrasound guidance, the remaining collection within more caudal aspect the right lobe of the liver was targeted with an 18 gauge  trocar needle. Multiple ultrasound images were saved procedural documentation purposes. A small amount of purulent fluid was aspirated and a short Amplatz wire was coiled within the collection. Appropriate positioning was confirmed with CT imaging. The track was dilated, ultimately allowing placement of a 12 [place] all-purpose drainage catheter with end coiled and locked within the remaining hepatic abscess. Approximately 80 cc of bloody purulent fluid was aspirated from the collection. Postprocedural imaging was obtained demonstrating appropriate positioning of both percutaneous drainage catheters Both drainage catheters were flushed with a small amount of saline and connected to gravity bags. Both drainage catheters were secured at the skin entrance site within interrupted sutures and StatLock devices. Dressings were applied. Patient developed rigors following placement of the drainage catheters which was treated with IV demerol upon her arrival back to the emergency department. The patient otherwise tolerated the procedure well without immediate postprocedural complication and remained hemodynamically stable throughout the procedure. IMPRESSION: 1. Successful ultrasound and CT-guided placement of 12 [place] all-purpose drainage catheters into both dominant hepatic abscesses within the right lobe of the liver yielding a total of 200 cc of bloody purulent fluid. A representative sample of aspirated fluid was capped and sent to the laboratory for analysis. 2. While the patient remained hemodynamically stable throughout the procedure, she developed postprocedural rigors which was subsequently treated with IV Demerol upon her arrival back to the emergency department. Electronically Signed   By: Sandi Mariscal M.D.   On: 11/04/2020 08:41   CT IMAGE GUIDED DRAINAGE BY PERCUTANEOUS CATHETER  Result Date: 11/04/2020 INDICATION: Concern for hepatic abscess. Please perform CT-guided aspiration versus drainage catheter  placement for infection source control purposes. EXAM: CT-GUIDED HEPATIC ABSCESS DRAINAGE CATHETER PLACEMENT X2 COMPARISON:  CT abdomen and pelvis-11/02/2020 MEDICATIONS: The patient is currently admitted to the hospital and receiving intravenous antibiotics. The antibiotics were administered within an appropriate time frame prior to the initiation of the procedure. ANESTHESIA/SEDATION: Moderate (conscious) sedation was employed during this procedure. A total of Versed 2 mg and Fentanyl 100 mcg was administered intravenously. Moderate Sedation Time: 27 minutes. The patient's level of consciousness and vital signs were monitored continuously by radiology nursing throughout the procedure under my direct supervision. CONTRAST:  None COMPLICATIONS: SIR LEVEL B - Normal therapy, includes overnight admission for observation. Patient developed postprocedural rigors requiring administration of IV demerol. PROCEDURE: Informed written consent was obtained from the patient after a discussion of the risks, benefits and alternatives to treatment. The patient was placed supine, slightly LPO on the CT gantry and a pre procedural CT was performed re-demonstrating the known abscess/fluid collections within the right lobe of the liver with dominant collection within the more cranial aspect of the right lobe of the liver measuring approximately 8.4 x 5.8 cm (image 21, series 2) and additional collection within the more caudal aspect of the right lobe of the liver measuring approximately 9.6 x 7.1 cm (image 34, series 2) however both collections were suboptimally visualized on acquired noncontrast images. Both collections were identified sonographically however again, both collections were suboptimally visualized secondary to patient body habitus and extensive hepatic steatosis. The procedure was planned. A timeout was performed prior to the initiation of the procedure. The skin  overlying the right upper abdomen was prepped and draped  in the usual sterile fashion. The overlying soft tissues were anesthetized with 1% lidocaine with epinephrine. Under direct ultrasound guidance, the dominant collection more caudal aspect of the right lobe of the liver was targeted with an 18 gauge trocar needle. Multiple ultrasound images were saved procedural documentation purposes. A small amount of purulent fluid was aspirated and a short Amplatz wire was coiled within the collection. Appropriate positioning was confirmed with CT imaging. Next, the track was dilated ultimately allowing placement of a 12 [place] all-purpose drainage catheter with end coiled and locked within the dominant hepatic abscess. Approximately 120 cc of bloody purulent fluid was aspirated from the collection. Postprocedural imaging demonstrates appropriate positioning of the initial drainage catheter with unchanged appearance of the additional collection within the more cranial aspect the right lobe of the liver. As such, decision was made to proceed with image guided placement of an additional hepatic abscess drainage catheter. Under direct ultrasound guidance, the remaining collection within more caudal aspect the right lobe of the liver was targeted with an 18 gauge trocar needle. Multiple ultrasound images were saved procedural documentation purposes. A small amount of purulent fluid was aspirated and a short Amplatz wire was coiled within the collection. Appropriate positioning was confirmed with CT imaging. The track was dilated, ultimately allowing placement of a 12 [place] all-purpose drainage catheter with end coiled and locked within the remaining hepatic abscess. Approximately 80 cc of bloody purulent fluid was aspirated from the collection. Postprocedural imaging was obtained demonstrating appropriate positioning of both percutaneous drainage catheters Both drainage catheters were flushed with a small amount of saline and connected to gravity bags. Both drainage catheters were  secured at the skin entrance site within interrupted sutures and StatLock devices. Dressings were applied. Patient developed rigors following placement of the drainage catheters which was treated with IV demerol upon her arrival back to the emergency department. The patient otherwise tolerated the procedure well without immediate postprocedural complication and remained hemodynamically stable throughout the procedure. IMPRESSION: 1. Successful ultrasound and CT-guided placement of 12 [place] all-purpose drainage catheters into both dominant hepatic abscesses within the right lobe of the liver yielding a total of 200 cc of bloody purulent fluid. A representative sample of aspirated fluid was capped and sent to the laboratory for analysis. 2. While the patient remained hemodynamically stable throughout the procedure, she developed postprocedural rigors which was subsequently treated with IV Demerol upon her arrival back to the emergency department. Electronically Signed   By: Sandi Mariscal M.D.   On: 11/04/2020 08:41    Labs:  CBC: Recent Labs    11/02/20 2141 11/03/20 0619 11/03/20 1804 11/04/20 0614 11/04/20 1425  WBC 21.9* 23.6* 13.0* 26.9*  --   HGB 15.0 14.9 15.1* 12.6 13.2  HCT 41.3 42.3 41.2 34.5*  --   PLT 196 206 189 167  --     COAGS: Recent Labs    11/03/20 0619  INR 1.3*    BMP: Recent Labs    11/02/20 1219 11/03/20 0619 11/03/20 1816 11/04/20 0614  NA 136 137  --  134*  K 3.4* 3.2* 3.6 3.4*  CL 103 102  --  101  CO2 21* 24  --  26  GLUCOSE 222* 111*  --  152*  BUN 20 17  --  18  CALCIUM 9.2 8.4*  --  7.7*  CREATININE 1.51* 0.94  --  0.80  GFRNONAA 39* >60  --  >60  LIVER FUNCTION TESTS: Recent Labs    11/02/20 1219 11/03/20 0619 11/04/20 0614  BILITOT 2.9* 3.2* 3.3*  AST 64* 59* 66*  ALT 77* 65* 61*  ALKPHOS 135* 131* 106  PROT 8.0 7.1 5.3*  ALBUMIN 3.1* 2.6* 2.1*    Assessment and Plan: Abdominal pain, imaging findings of two large complex cystic  lesions posterior right hepatic lobe, findings of diverticulitis with small abscess hepatic abscesses, on IV Zosyn  S/p placement of 2 percutaneous 70F drainage catheters into the dominant collections within the right lobe on 10/3 with 259mL bloody purulent fluid aspirated. Post procedure the patient did develop rigors treated with IV Demerol, vitals signs remained stable.  Wbc today trend is up 26.9 (13)- not unexpected, however should trend down in next 24 to 48 hours, afebrile, clinical symptoms improving, Cx GPC  Continue to flush drains as ordered and monitor daily output. Please call IR with any questions or concerns, patient will likely need repeat CT imaging prior to any drain removal. We will follow along.     Electronically Signed: Hedy Jacob, [name] 11/04/2020, 3:59 PM   I spent a total of 15 Minutes at the the patient's bedside AND on the patient's hospital floor or unit, greater than 50% of which was counseling/coordinating care for liver abscesses.

## 2020-11-04 NOTE — Progress Notes (Signed)
*  PRELIMINARY RESULTS* Echocardiogram 2D Echocardiogram has been performed.  Stacy Cox [name] 11/04/2020, 12:53 PM

## 2020-11-04 NOTE — Progress Notes (Signed)
[facility] SURGICAL PROGRESS NOTE (cpt [phone])  Hospital Day(s): 2.   Interval History: Patient seen and examined. She underwent percutaneous drainage of liver abscess x2 late in the afternoon with IR. Following this, she became increasingly tachycardic and tachypneic. She was given metoprolol and Cardizem with improvement in her HR. Ultimately admitted to step down with cardiology consultation.    This morning, she reports that she has started to feel better. No fever, nausea, emesis, or abdominal pain. She did have a significant jump in leukocytosis to 26.9K. Renal function remains normal; sCr - 0.80; UO - 350 ccs + unmeasured. My hypokalemia to 3.4 o/w no electrolyte derangements. Lactic acid level normalized to 1.5. Her heart rate is significantly improved. Abscess drains with 70 ccs out recorded; sanguinous. Cx from this are growing [place]. She continues on Vancomycin and Zosyn.   Review of Systems:  Constitutional: denies fever, chills  HEENT: denies cough or congestion  Respiratory: denies any shortness of breath  Cardiovascular: denies chest pain or palpitations  Gastrointestinal: denies abdominal pain, N/V, or diarrhea/and bowel function as per interval history Genitourinary: denies burning with urination or urinary frequency  Vital signs in last 24 hours: [min-max] current  Temp:  [98.9 F (37.2 C)-99.2 F (37.3 C)] 98.9 F (37.2 C) (10/04 0400) Pulse Rate:  [49-159] 66 (10/04 0700) Resp:  [16-47] 20 (10/04 0700) BP: (78-165)/(41-137) 92/51 (10/04 0700) SpO2:  [90 %-100 %] 94 % (10/04 0700) Weight:  [111.7 kg] 111.7 kg (10/03 2015)     Height: 5\' 8"  (172.7 cm) Weight: 111.7 kg BMI (Calculated): 37.45   Intake/Output last 2 shifts:  10/03 0701 - 10/04 0700 In: 1653.3 [I.V.:1199.2; IV Piggyback:454.2] Out: 672 [Urine:350]   Physical Exam:  Constitutional: alert, cooperative and no distress  HENT: normocephalic without obvious abnormality  Eyes: PERRL, EOM's  grossly intact and symmetric  Respiratory: breathing non-labored at rest; on [place] Cardiovascular: regular rate and sinus rhythm  Gastrointestinal: Soft, mild soreness at drain site, non-distended, no rebound/guarding. Percutaneous drains x2 in the RUQ; output sanguinous  Musculoskeletal: no edema or wounds, motor and sensation grossly intact, NT    Labs:  CBC Latest Ref Rng & Units 11/04/2020 11/03/2020 11/03/2020  WBC 4.0 - 10.5 K/uL 26.9(H) 13.0(H) 23.6(H)  Hemoglobin 12.0 - 15.0 g/dL 12.6 15.1(H) 14.9  Hematocrit 36.0 - 46.0 % 34.5(L) 41.2 42.3  Platelets 150 - 400 K/uL 167 189 206   CMP Latest Ref Rng & Units 11/04/2020 11/03/2020 11/03/2020  Glucose 70 - 99 mg/dL 152(H) - 111(H)  BUN 8 - 23 mg/dL 18 - 17  Creatinine 0.44 - 1.00 mg/dL 0.80 - 0.94  Sodium 135 - 145 mmol/L 134(L) - 137  Potassium 3.5 - 5.1 mmol/L 3.4(L) 3.6 3.2(L)  Chloride 98 - 111 mmol/L 101 - 102  CO2 22 - 32 mmol/L 26 - 24  Calcium 8.9 - 10.3 mg/dL 7.7(L) - 8.4(L)  Total Protein 6.5 - 8.1 g/dL 5.3(L) - 7.1  Total Bilirubin 0.3 - 1.2 mg/dL 3.3(H) - 3.2(H)  Alkaline Phos 38 - 126 U/L 106 - 131(H)  AST 15 - 41 U/L 66(H) - 59(H)  ALT 0 - 44 U/L 61(H) - 65(H)     Imaging studies: No new pertinent imaging studies   Assessment/Plan: (ICD-10's: K62.92) 65 y.o. female with what sounds like transient bacteremia follow percutaneous drainage of liver abscesses in the right posterior lobe and possible concomitant diverticulitis.    - Okay to continue diet as tolerated from surgical perspective - Continue IV  Abx (Vancomycin and Zosyn); follow up Cx and narrow - Monitor drains; record output daily -- Greatly appreciate IR assistance and recommendation  - Pain control prn; antiemetics prn - Monitor leukocytosis; worsening today; likely result of drain placement yesterday   - No emergent surgical interventions - Further management per primary service; we will follow    All of the above findings and recommendations were  discussed with the patient, and the medical team, and all of patient's questions were answered to her expressed satisfaction.  -- Edison Simon, [name] [facility] 11/04/2020, 7:42 AM [phone] M-F: 7am - 4pm

## 2020-11-04 NOTE — Progress Notes (Signed)
PHARMACY - PHYSICIAN COMMUNICATION CRITICAL VALUE ALERT - BLOOD CULTURE IDENTIFICATION (BCID)  Stacy Cox is an 65 y.o. female who presented to [facility] on 11/02/2020 with a chief complaint of SOB, tachycardia. CT with concerns of liver abscess and acute diverticulitis with abscess  Assessment:  10/3 Blood culture with GPC in 1 of 3 bottles.  BCID detects Streptococcus spp.    Name of physician (or Provider) Contacted: DR Leslye Peer  Current antibiotics: Vancomycin and piperacillin/tazobactam  Changes to prescribed antibiotics recommended:  Patient is on recommended antibiotics - No changes needed.  Appears ID to see  Results for orders placed or performed during the hospital encounter of 11/02/20  Blood Culture ID Panel (Reflexed) (Collected: 11/02/2020  1:24 PM)  Result Value Ref Range   Enterococcus faecalis NOT DETECTED NOT DETECTED   Enterococcus Faecium NOT DETECTED NOT DETECTED   Listeria monocytogenes NOT DETECTED NOT DETECTED   Staphylococcus species NOT DETECTED NOT DETECTED   Staphylococcus aureus (BCID) NOT DETECTED NOT DETECTED   Staphylococcus epidermidis NOT DETECTED NOT DETECTED   Staphylococcus lugdunensis NOT DETECTED NOT DETECTED   Streptococcus species DETECTED (A) NOT DETECTED   Streptococcus agalactiae NOT DETECTED NOT DETECTED   Streptococcus pneumoniae NOT DETECTED NOT DETECTED   Streptococcus pyogenes NOT DETECTED NOT DETECTED   A.calcoaceticus-baumannii NOT DETECTED NOT DETECTED   Bacteroides fragilis NOT DETECTED NOT DETECTED   Enterobacterales NOT DETECTED NOT DETECTED   Enterobacter cloacae complex NOT DETECTED NOT DETECTED   Escherichia coli NOT DETECTED NOT DETECTED   Klebsiella aerogenes NOT DETECTED NOT DETECTED   Klebsiella oxytoca NOT DETECTED NOT DETECTED   Klebsiella pneumoniae NOT DETECTED NOT DETECTED   Proteus species NOT DETECTED NOT DETECTED   Salmonella species NOT DETECTED NOT DETECTED   Serratia marcescens NOT DETECTED NOT DETECTED    Haemophilus influenzae NOT DETECTED NOT DETECTED   Neisseria meningitidis NOT DETECTED NOT DETECTED   Pseudomonas aeruginosa NOT DETECTED NOT DETECTED   Stenotrophomonas maltophilia NOT DETECTED NOT DETECTED   Candida albicans NOT DETECTED NOT DETECTED   Candida auris NOT DETECTED NOT DETECTED   Candida glabrata NOT DETECTED NOT DETECTED   Candida krusei NOT DETECTED NOT DETECTED   Candida parapsilosis NOT DETECTED NOT DETECTED   Candida tropicalis NOT DETECTED NOT DETECTED   Cryptococcus neoformans/gattii NOT DETECTED NOT DETECTED    Doreene Eland, PharmD, BCPS.   Work Cell: [phone] 11/04/2020 1:51 PM

## 2020-11-04 NOTE — Progress Notes (Signed)
[place] for Eliquis Indication: atrial fibrillation  No Known Allergies  Patient Measurements: Height: 5\' 8"  (172.7 cm) Weight: 111.7 kg (246 lb 4.1 oz) IBW/kg (Calculated) : 63.9  Vital Signs: Temp: 97.9 F (36.6 C) (10/04 1200) Temp Source: Oral (10/04 1200) BP: 108/83 (10/04 1300) Pulse Rate: 70 (10/04 1300)  Labs: Recent Labs    11/02/20 1219 11/02/20 2141 11/03/20 0619 11/03/20 1804 11/03/20 1816 11/04/20 0614  HGB 18.0* 15.0 14.9 15.1*  --  12.6  HCT 48.1* 41.3 42.3 41.2  --  34.5*  PLT 216 196 206 189  --  167  LABPROT  --   --  16.0*  --   --   --   INR  --   --  1.3*  --   --   --   CREATININE 1.51*  --  0.94  --   --  0.80  TROPONINIHS 8 10  --   --  12  --     Estimated Creatinine Clearance: 95.5 mL/min (by C-G formula based on SCr of 0.8 mg/dL).   Medical History: Past Medical History:  Diagnosis Date   CHF (congestive heart failure) (HCC)    COPD (chronic obstructive pulmonary disease) (HCC)    Diabetes mellitus, type 2 (HCC)    GERD (gastroesophageal reflux disease)    Heart disease    Hypertension    Joint pain    Polycythemia vera (HCC)    Tobacco abuse      Assessment: 65 year old female with acute diverticulitis with liver abscesses s/p drain placement. Patient with brief episode of atrial fibrillation. Cardiology started amiodarone and Eliquis.   Plan:  Eliquis 5 mg BID. Now on hold given drop in hemoglobin. Will continue to follow for appropriate restart of Eliquis if indicated.  Tawnya Crook, PharmD, BCPS Clinical Pharmacist 11/04/2020 2:06 PM

## 2020-11-04 NOTE — Progress Notes (Signed)
Patient ID: Stacy Cox, female   DOB: 1955/05/08, 65 y.o.   MRN: [id number] [facility] PROGRESS NOTE  Stacy Cox:[id number] DOB: [birth year] DOA: 11/02/2020 PCP: Donnie Coffin, MD  HPI/Subjective: Patient still feels short of breath.  Has some abdominal pain.  No nausea or vomiting.  She states that she was able to eat last night.  Doing better than last night after the liver abscess drain.  Admitted with sepsis and liver abscess.  Objective: Vitals:   11/04/20 1200 11/04/20 1300  BP: 101/74 108/83  Pulse: 72 70  Resp: 18 14  Temp: 97.9 F (36.6 C)   SpO2: 98% 96%    Intake/Output Summary (Last 24 hours) at 11/04/2020 1321 Last data filed at 11/04/2020 1300 Gross per 24 hour  Intake 2292.01 ml  Output 545 ml  Net 1747.01 ml   Filed Weights   11/03/20 2015  Weight: 111.7 kg    ROS: Review of Systems  Respiratory:  Positive for shortness of breath.   Cardiovascular:  Negative for chest pain.  Gastrointestinal:  Positive for abdominal pain. Negative for nausea and vomiting.  Exam: Physical Exam HENT:     Head: Normocephalic.     Mouth/Throat:     Pharynx: No oropharyngeal exudate.  Eyes:     General: Lids are normal.     Conjunctiva/sclera: Conjunctivae normal.  Cardiovascular:     Rate and Rhythm: Normal rate and regular rhythm.     Heart sounds: Normal heart sounds, S1 normal and S2 normal.  Pulmonary:     Breath sounds: Examination of the right-lower field reveals decreased breath sounds. Examination of the left-lower field reveals decreased breath sounds. Decreased breath sounds present. No wheezing, rhonchi or rales.  Abdominal:     Palpations: Abdomen is soft.     Tenderness: There is abdominal tenderness in the right upper quadrant and left lower quadrant.  Musculoskeletal:     Right lower leg: Swelling present.     Left lower leg: Swelling present.  Skin:    General: Skin is warm.     Findings: No rash.  Neurological:     Mental Status: She  is alert and oriented to person, place, and time.      Scheduled Meds:  amiodarone  200 mg Oral Daily   apixaban  5 mg Oral BID   Chlorhexidine Gluconate Cloth  6 each Topical Q0600   dextromethorphan-guaiFENesin  1 tablet Oral BID   insulin aspart  0-15 Units Subcutaneous Q4H   sodium chloride flush  5 mL Intracatheter Q8H   Continuous Infusions:  sodium chloride Stopped (11/04/20 0534)   lactated ringers 100 mL/hr at 11/04/20 1300   piperacillin-tazobactam (ZOSYN)  IV Stopped (11/04/20 0935)   vancomycin Stopped (11/03/20 2341)   Brief history.  65 year old female with Past medical history of COPD, CHF, type 2 diabetes mellitus, GERD, hypertension, polycythemia vera.  She was admitted to the hospital on 11/02/2020 with clinical sepsis acute diverticulitis and liver abscesses.  Interventional radiology was able to place a drain in the abscess on 11/03/2020.  Strep species growing out of 1 blood culture.  Abscess culture has gram-positive cocci.  After the patient is drained procedure yesterday patient was tachycardic up into the 150s and had altered mental status and tachypnea and was transferred to the ICU to stepdown.  Assessment/Plan:  Clinical sepsis, present on admission with acute diverticulitis with liver abscesses.  Patient had a leukocytosis of 24.5 and a fever of 103 and tachycardic up to the  150s and tachypneic.  Patient had a interventional radiology procedure for liver abscess drain.  1 blood culture growing strep species and abscess culture growing gram-positive cocci.  Patient on Zosyn and vancomycin.  We will get ID consultation.  Decrease rate of IV fluids.  If blood pressure holds will hopefully be able to get out of the ICU. Relative hypotension with history of essential hypertension and chronic diastolic congestive heart failure.  With blood pressure being on the lower side unable to give any medications at this point.  Watch closely while on IV fluids. Drop in hemoglobin  with history of polycythemia vera.  Hemoglobin did drop down to 12.6.  Watch closely.  May be bleeding with the liver procedure.  May end up needing another imaging test if hemoglobin continues to drop.  Hemoglobin was 18 on presentation was dehydrated at that time.  Was 53 yesterday.  Cardiology started Eliquis which we will hold at this point. Brief atrial fibrillation yesterday with heart rates above 150.  Received IV doses of medications but limited with hypotension.  Cardiology started on amiodarone.  Will hold Eliquis at this point with drop in hemoglobin. Type 2 diabetes mellitus with neuropathy.  Continue gabapentin. Lactic acidosis.  Hold metformin Electrolyte abnormalities including hypokalemia hypomagnesemia and hypophosphatemia replaced yesterday.        Code Status:     Code Status Orders  (From admission, onward)           Start     Ordered   11/02/20 1957  Full code  Continuous        11/02/20 1958           Code Status History     Date Active Date Inactive Code Status Order ID Comments User Context   05/03/2015 0738 05/03/2015 1648 Full Code [id number]  Elonda Husky, RN Inpatient   05/03/2015 0130 05/03/2015 0736 DNR [id number]  Sylvan Cheese, MD ED      Family Communication: Left message for daughter Disposition Plan: Status is: Inpatient  Dispo: The patient is from: Home              Anticipated d/c is to: Home              Patient currently doing little bit better than yesterday.   Difficult to place patient.  No.  Consultants: Interventional radiology Critical care specialist Cardiology Consulted infectious disease  Procedures: IR liver drain  Antibiotics: Vancomycin and Zosyn  Time spent: 28 minutes  Stacy Cox

## 2020-11-04 NOTE — Consult Note (Signed)
Stacy Cox is a 65 y.o. female  [id number]  Primary Cardiologist: Stacy Cox Reason for Consultation: Shortness of breath chest pain  HPI: Is a 65 year old [name] who presented to the hospital with shortness of breath and palpitation and chest pain.   Review of Systems: Did have some tightness in the chest.   Past Medical History:  Diagnosis Date   CHF (congestive heart failure) (HCC)    COPD (chronic obstructive pulmonary disease) (HCC)    Diabetes mellitus, type 2 (HCC)    GERD (gastroesophageal reflux disease)    Heart disease    Hypertension    Joint pain    Polycythemia vera (HCC)    Tobacco abuse     Medications Prior to Admission  Medication Sig Dispense Refill   aspirin 325 MG EC tablet Take 325 mg by mouth daily.     b complex vitamins tablet Take 1 tablet by mouth every other day.      COREG CR 80 MG 24 hr capsule Take 1 tablet by mouth daily.     furosemide (LASIX) 40 MG tablet Take 1 tablet by mouth daily.     gabapentin (NEURONTIN) 100 MG capsule Take 100-200 mg by mouth 3 (three) times daily.     lactobacillus acidophilus & bulgar (LACTINEX) chewable tablet Chew 1 tablet by mouth 3 (three) times daily with meals. 60 tablet 0   losartan (COZAAR) 25 MG tablet Take 1 tablet by mouth daily.     metFORMIN (GLUCOPHAGE) 500 MG tablet Take 1 tablet by mouth daily with breakfast.     Multiple Vitamins-Minerals (MULTIVITAL PO) Take 1 tablet by mouth daily.     pantoprazole (PROTONIX) 40 MG tablet Take 1 tablet by mouth daily.     potassium chloride SA (K-DUR,KLOR-CON) 20 MEQ tablet Take 1 tablet by mouth daily.     spironolactone (ALDACTONE) 25 MG tablet Take 1 tablet by mouth daily.        Chlorhexidine Gluconate Cloth  6 each Topical Q0600   dextromethorphan-guaiFENesin  1 tablet Oral BID   insulin aspart  0-15 Units Subcutaneous Q4H    Infusions:  sodium chloride Stopped (11/04/20 0534)   lactated ringers 100 mL/hr at 11/04/20 0800    phenylephrine (NEO-SYNEPHRINE) Adult infusion Stopped (11/03/20 2304)   piperacillin-tazobactam (ZOSYN)  IV 12.5 mL/hr at 11/04/20 0800   vancomycin Stopped (11/03/20 2341)    No Known Allergies  Social History   Socioeconomic History   Marital status: Divorced    Spouse name: Not on file   Number of children: Not on file   Years of education: Not on file   Highest education level: Not on file  Occupational History   Not on file  Tobacco Use   Smoking status: Every Day    Packs/day: 0.25    Years: 40.00    Pack years: 10.00    Types: Cigarettes   Smokeless tobacco: Never   Tobacco comments:    smoking since teens  Substance and Sexual Activity   Alcohol use: No    Alcohol/week: 0.0 standard drinks   Drug use: No   Sexual activity: Not Currently  Other Topics Concern   Not on file  Social History Narrative   Not on file   Social Determinants of Health   Financial Resource Strain: Not on file  Food Insecurity: Not on file  Transportation Needs: Not on file  Physical Activity: Not on file  Stress: Not on file  Social Connections: Not on file  Intimate Partner Violence: Not on file    Family History  Problem Relation Age of Onset   Breast cancer Stacy        Stacy Cox age 66   Kidney cancer Stacy        Stacy Cox age -67's   Breast cancer Stacy        Stacy Cox age 79's   Kidney disease Stacy        Stacy Cox age 29's   Diabetes Stacy Cox    Heart disease Stacy Cox        + BYPASS SURGERY   Deep vein thrombosis Stacy Cox    Arthritis Stacy Cox    Diabetes Stacy    Heart disease Stacy    Hypertension Brother    Heart attack Stacy Cox     PHYSICAL EXAM: Vitals:   11/04/20 0745 11/04/20 0800  BP: 102/61 (!) 90/52  Pulse: 62 70  Resp: (!) 27 13  Temp:  97.8 F (36.6 C)  SpO2: 93% 94%     Intake/Output Summary (Last 24 hours) at 11/04/2020 0846 Last data filed at 11/04/2020 0800 Gross per 24 hour  Intake 1771.96 ml  Output 545 ml  Net 1226.96 ml     General:  Well appearing. No respiratory difficulty HEENT: normal Neck: supple. no JVD. Carotids 2+ bilat; no bruits. No lymphadenopathy or thryomegaly appreciated. Cor: PMI nondisplaced. Regular rate & rhythm. No rubs, gallops or murmurs. Lungs: clear Abdomen: soft, nontender, nondistended. No hepatosplenomegaly. No bruits or masses. Good bowel sounds. Extremities: no cyanosis, clubbing, rash, edema Neuro: alert & oriented x 3, cranial nerves grossly intact. moves all 4 extremities w/o difficulty. Affect pleasant.  ECG: Atrial fibrillation with rapid ventricular response rate about 140  Results for orders placed or performed during the hospital encounter of 11/02/20 (from the past 24 hour(s))  CBG monitoring, ED     Status: Abnormal   Collection Time: 11/03/20 11:34 AM  Result Value Ref Range   Glucose-Capillary 139 (H) 70 - 99 mg/dL  Aerobic/Anaerobic Culture w Gram Stain (surgical/deep wound)     Status: None (Preliminary result)   Collection Time: 11/03/20  4:30 PM   Specimen: Abscess  Result Value Ref Range   Specimen Description      ABSCESS Performed at [facility] [address] [place], [place] [address]    Special Requests      NONE Performed at [facility], [place], [place] [address]    Gram Stain      NO ORGANISMS SEEN SQUAMOUS EPITHELIAL CELLS PRESENT ABUNDANT WBC PRESENT,BOTH PMN AND MONONUCLEAR ABUNDANT GRAM POSITIVE COCCI    Culture      NO GROWTH < 12 HOURS Performed at [facility], [place] [address] [place], [place] [address]    Report Status PENDING   CBG monitoring, ED     Status: Abnormal   Collection Time: 11/03/20  4:52 PM  Result Value Ref Range   Glucose-Capillary 123 (H) 70 - 99 mg/dL  Blood gas, arterial     Status: Abnormal   Collection Time: 11/03/20  5:56 PM  Result Value Ref Range   FIO2 0.28    Delivery systems NASAL CANNULA    pH, Arterial 7.51 (H) 7.350 - 7.450   pCO2 arterial 25 (L)  32.0 - 48.0 mmHg   pO2, Arterial 68 (L) 83.0 - 108.0 mmHg   Bicarbonate 19.9 (L) 20.0 - 28.0 mmol/L   Acid-base deficit 1.3 0.0 - 2.0 mmol/L  O2 Saturation 95.1 %   Patient temperature 37.0    Collection site LEFT RADIAL    Sample type ARTERIAL DRAW    Allens test (pass/fail) PASS PASS  CBC with Differential/Platelet     Status: Abnormal   Collection Time: 11/03/20  6:04 PM  Result Value Ref Range   WBC 13.0 (H) 4.0 - 10.5 K/uL   RBC 4.62 3.87 - 5.11 MIL/uL   Hemoglobin 15.1 (H) 12.0 - 15.0 g/dL   HCT 41.2 36.0 - 46.0 %   MCV 89.2 80.0 - 100.0 fL   MCH 32.7 26.0 - 34.0 pg   MCHC 36.7 (H) 30.0 - 36.0 g/dL   RDW 14.0 11.5 - 15.5 %   Platelets 189 150 - 400 K/uL   nRBC 0.0 0.0 - 0.2 %   Neutrophils Relative % 92 %   Neutro Abs 12.0 (H) 1.7 - 7.7 K/uL   Lymphocytes Relative 5 %   Lymphs Abs 0.6 (L) 0.7 - 4.0 K/uL   Monocytes Relative 1 %   Monocytes Absolute 0.2 0.1 - 1.0 K/uL   Eosinophils Relative 0 %   Eosinophils Absolute 0.0 0.0 - 0.5 K/uL   Basophils Relative 1 %   Basophils Absolute 0.1 0.0 - 0.1 K/uL   Immature Granulocytes 1 %   Abs Immature Granulocytes 0.10 (H) 0.00 - 0.07 K/uL  Potassium     Status: None   Collection Time: 11/03/20  6:16 PM  Result Value Ref Range   Potassium 3.6 3.5 - 5.1 mmol/L  Magnesium     Status: Abnormal   Collection Time: 11/03/20  6:16 PM  Result Value Ref Range   Magnesium 1.6 (L) 1.7 - 2.4 mg/dL  Phosphorus     Status: Abnormal   Collection Time: 11/03/20  6:16 PM  Result Value Ref Range   Phosphorus 1.3 (L) 2.5 - 4.6 mg/dL  Troponin I (High Sensitivity)     Status: None   Collection Time: 11/03/20  6:16 PM  Result Value Ref Range   Troponin I (High Sensitivity) 12 <18 ng/L  MRSA Next Gen by PCR, Nasal     Status: None   Collection Time: 11/03/20  8:22 PM   Specimen: Nasal Mucosa; Nasal Swab  Result Value Ref Range   MRSA by PCR Next Gen NOT DETECTED NOT DETECTED  phosphorus level     Status: Abnormal   Collection Time:  11/03/20 10:24 PM  Result Value Ref Range   Phosphorus 1.6 (L) 2.5 - 4.6 mg/dL  Lactic acid, plasma     Status: Abnormal   Collection Time: 11/03/20 10:24 PM  Result Value Ref Range   Lactic Acid, Venous 2.3 (HH) 0.5 - 1.9 mmol/L  Blood gas, venous     Status: Abnormal   Collection Time: 11/03/20 10:24 PM  Result Value Ref Range   pH, Ven 7.48 (H) 7.250 - 7.430   pCO2, Ven 34 (L) 44.0 - 60.0 mmHg   pO2, Ven 42.0 32.0 - 45.0 mmHg   Bicarbonate 25.3 20.0 - 28.0 mmol/L   Acid-Base Excess 2.2 (H) 0.0 - 2.0 mmol/L   O2 Saturation 81.6 %   Patient temperature 37.0    Collection site VEIN    Sample type VENOUS   Glucose, capillary     Status: Abnormal   Collection Time: 11/03/20 11:10 PM  Result Value Ref Range   Glucose-Capillary 158 (H) 70 - 99 mg/dL  Glucose, capillary     Status: Abnormal   Collection Time: 11/04/20  5:00  AM  Result Value Ref Range   Glucose-Capillary 155 (H) 70 - 99 mg/dL   Comment 1 Notify RN    Comment 2 Document in Chart   Lactic acid, plasma     Status: None   Collection Time: 11/04/20  6:14 AM  Result Value Ref Range   Lactic Acid, Venous 1.5 0.5 - 1.9 mmol/L  Comprehensive metabolic panel     Status: Abnormal   Collection Time: 11/04/20  6:14 AM  Result Value Ref Range   Sodium 134 (L) 135 - 145 mmol/L   Potassium 3.4 (L) 3.5 - 5.1 mmol/L   Chloride 101 98 - 111 mmol/L   CO2 26 22 - 32 mmol/L   Glucose, Bld 152 (H) 70 - 99 mg/dL   BUN 18 8 - 23 mg/dL   Creatinine, Ser 0.80 0.44 - 1.00 mg/dL   Calcium 7.7 (L) 8.9 - 10.3 mg/dL   Total Protein 5.3 (L) 6.5 - 8.1 g/dL   Albumin 2.1 (L) 3.5 - 5.0 g/dL   AST 66 (H) 15 - 41 U/L   ALT 61 (H) 0 - 44 U/L   Alkaline Phosphatase 106 38 - 126 U/L   Total Bilirubin 3.3 (H) 0.3 - 1.2 mg/dL   GFR, Estimated >60 >60 mL/min   Anion gap 7 5 - 15  Magnesium     Status: None   Collection Time: 11/04/20  6:14 AM  Result Value Ref Range   Magnesium 2.4 1.7 - 2.4 mg/dL  Phosphorus     Status: None   Collection  Time: 11/04/20  6:14 AM  Result Value Ref Range   Phosphorus 4.4 2.5 - 4.6 mg/dL  CBC with Differential/Platelet     Status: Abnormal   Collection Time: 11/04/20  6:14 AM  Result Value Ref Range   WBC 26.9 (H) 4.0 - 10.5 K/uL   RBC 3.91 3.87 - 5.11 MIL/uL   Hemoglobin 12.6 12.0 - 15.0 g/dL   HCT 34.5 (L) 36.0 - 46.0 %   MCV 88.2 80.0 - 100.0 fL   MCH 32.2 26.0 - 34.0 pg   MCHC 36.5 (H) 30.0 - 36.0 g/dL   RDW 14.3 11.5 - 15.5 %   Platelets 167 150 - 400 K/uL   nRBC 0.0 0.0 - 0.2 %   Neutrophils Relative % 84 %   Neutro Abs 22.4 (H) 1.7 - 7.7 K/uL   Lymphocytes Relative 7 %   Lymphs Abs 2.0 0.7 - 4.0 K/uL   Monocytes Relative 7 %   Monocytes Absolute 1.9 (H) 0.1 - 1.0 K/uL   Eosinophils Relative 0 %   Eosinophils Absolute 0.1 0.0 - 0.5 K/uL   Basophils Relative 0 %   Basophils Absolute 0.0 0.0 - 0.1 K/uL   Smear Review Normal platelet morphology    Immature Granulocytes 2 %   Abs Immature Granulocytes 0.53 (H) 0.00 - 0.07 K/uL   Burr Cells PRESENT   Procalcitonin     Status: None   Collection Time: 11/04/20  6:14 AM  Result Value Ref Range   Procalcitonin 75.94 ng/mL  Glucose, capillary     Status: Abnormal   Collection Time: 11/04/20  7:46 AM  Result Value Ref Range   Glucose-Capillary 155 (H) 70 - 99 mg/dL   DG Chest 2 View  Result Date: 11/02/2020 CLINICAL DATA:  Shortness of breath EXAM: CHEST - 2 VIEW COMPARISON:  May 02, 2015 FINDINGS: The heart, hila, and mediastinum are normal. No pneumothorax. No nodules or masses. No focal  infiltrates or overt edema. IMPRESSION: No active cardiopulmonary disease. Electronically Signed   By: Dorise Bullion III M.D.   On: 11/02/2020 13:02   CT Angio Chest PE W and/or Wo Contrast  Result Date: 11/02/2020 CLINICAL DATA:  Tachycardia. Abdominal pain. Suspect pulmonary embolism. EXAM: CT ANGIOGRAPHY CHEST CT ABDOMEN AND PELVIS WITH CONTRAST TECHNIQUE: Multidetector CT imaging of the chest was performed using the standard protocol  during bolus administration of intravenous contrast. Multiplanar CT image reconstructions and MIPs were obtained to evaluate the vascular anatomy. Multidetector CT imaging of the abdomen and pelvis was performed using the standard protocol during bolus administration of intravenous contrast. CONTRAST:  25mL OMNIPAQUE IOHEXOL 350 MG/ML SOLN COMPARISON:  CT abdomen/pelvis 05/02/2015 and CT chest 02/14/2008 FINDINGS: CTA CHEST FINDINGS Cardiovascular: Heart is normal size. Calcified plaque over the left main and 3 vessel coronary arteries. Thoracic aorta is normal in caliber. Pulmonary arterial system is well opacified without evidence of emboli. Mediastinum/Nodes: No mediastinal or hilar adenopathy. 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 Lungs/Pleura: Lungs are well inflated without focal airspace consolidation or effusion. Airways are normal. Musculoskeletal: Mild degenerative change of the spine. Review of the MIP images confirms the above findings. CT ABDOMEN and PELVIS FINDINGS Hepatobiliary: There are 2 adjacent masslike areas of low attenuation over the right lobe of the liver measuring 7.8 x 9.2 cm and more inferiorly over the right lobe measuring 5.5 x 8.8 cm. Gallbladder is somewhat contracted. Biliary tree is normal. Pancreas: Normal. Spleen: Normal. Adrenals/Urinary Tract: Right adrenal gland is normal. 3.1 cm left adrenal mass without significant change. Kidneys are normal in size without hydronephrosis or nephrolithiasis. Couple subcentimeter left renal cortical hypodensities too small to characterize but likely cysts. Ureters and bladder are normal. Stomach/Bowel: Stomach and small bowel are normal. Appendix is normal. Minimal diverticulosis of the colon. There is a rounded 1.8 cm hypodensity containing minimal fluid and air which abuts the superolateral aspect of the adjacent sigmoid colon and may represent evidence of diverticulitis with small diverticular abscess. This  structure also abuts the anterior aspect of the adjacent left ovary. Vascular/Lymphatic: Abdominal aorta is normal caliber with mild calcified plaque present. No adenopathy. Reproductive: Uterus and right ovary are normal. Left ovary likely normal although does abut an indeterminate structure as described above. Other: None. Musculoskeletal: No focal abnormality. IMPRESSION: 1. No evidence of pulmonary embolism and no acute cardiopulmonary disease. 2. Two adjacent masslike areas of low attenuation over the right lobe of the liver with the larger measuring 7.8 x 9.2 cm and more inferiorly over the right lobe measuring 5.5 x 8.8 cm. Findings may be due to infection/abscess in light of possible diverticular abscess as described below. Metastatic disease is also a concern as well as possible primary liver neoplasm. 3. There is an indeterminate 1.8 cm hypodensity containing minimal fluid and air which abuts the superolateral aspect of the adjacent sigmoid colon and may represent evidence of diverticulitis with small diverticular abscess. 4. Aortic atherosclerosis. Atherosclerotic coronary artery disease. 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 Recommend thyroid ultrasound for further evaluation. (Ref: J Am Coll Radiol. 2015 Feb;12(2): 143-50). 6. 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 Few subcentimeter left renal cortical hypodensities, too small to characterize, but likely cysts. Aortic Atherosclerosis (ICD10-I70.0). Electronically Signed   By: Marin Olp M.D.   On: 11/02/2020 15:01   [place] Intraoperative  Result Date: 11/03/2020 CLINICAL DATA:  Ultrasound was provided for use by the ordering physician.  No provider Interpretation or professional fees incurred.    CT ABDOMEN PELVIS W CONTRAST  Result Date: 11/02/2020 CLINICAL DATA:  Tachycardia. Abdominal pain. Suspect pulmonary embolism. EXAM: CT ANGIOGRAPHY CHEST CT ABDOMEN AND PELVIS WITH CONTRAST TECHNIQUE: Multidetector CT imaging of  the chest was performed using the standard protocol during bolus administration of intravenous contrast. Multiplanar CT image reconstructions and MIPs were obtained to evaluate the vascular anatomy. Multidetector CT imaging of the abdomen and pelvis was performed using the standard protocol during bolus administration of intravenous contrast. CONTRAST:  1mL OMNIPAQUE IOHEXOL 350 MG/ML SOLN COMPARISON:  CT abdomen/pelvis 05/02/2015 and CT chest 02/14/2008 FINDINGS: CTA CHEST FINDINGS Cardiovascular: Heart is normal size. Calcified plaque over the left main and 3 vessel coronary arteries. Thoracic aorta is normal in caliber. Pulmonary arterial system is well opacified without evidence of emboli. Mediastinum/Nodes: No mediastinal or hilar adenopathy. 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 Lungs/Pleura: Lungs are well inflated without focal airspace consolidation or effusion. Airways are normal. Musculoskeletal: Mild degenerative change of the spine. Review of the MIP images confirms the above findings. CT ABDOMEN and PELVIS FINDINGS Hepatobiliary: There are 2 adjacent masslike areas of low attenuation over the right lobe of the liver measuring 7.8 x 9.2 cm and more inferiorly over the right lobe measuring 5.5 x 8.8 cm. Gallbladder is somewhat contracted. Biliary tree is normal. Pancreas: Normal. Spleen: Normal. Adrenals/Urinary Tract: Right adrenal gland is normal. 3.1 cm left adrenal mass without significant change. Kidneys are normal in size without hydronephrosis or nephrolithiasis. Couple subcentimeter left renal cortical hypodensities too small to characterize but likely cysts. Ureters and bladder are normal. Stomach/Bowel: Stomach and small bowel are normal. Appendix is normal. Minimal diverticulosis of the colon. There is a rounded 1.8 cm hypodensity containing minimal fluid and air which abuts the superolateral aspect of the adjacent sigmoid colon and may represent evidence of  diverticulitis with small diverticular abscess. This structure also abuts the anterior aspect of the adjacent left ovary. Vascular/Lymphatic: Abdominal aorta is normal caliber with mild calcified plaque present. No adenopathy. Reproductive: Uterus and right ovary are normal. Left ovary likely normal although does abut an indeterminate structure as described above. Other: None. Musculoskeletal: No focal abnormality. IMPRESSION: 1. No evidence of pulmonary embolism and no acute cardiopulmonary disease. 2. Two adjacent masslike areas of low attenuation over the right lobe of the liver with the larger measuring 7.8 x 9.2 cm and more inferiorly over the right lobe measuring 5.5 x 8.8 cm. Findings may be due to infection/abscess in light of possible diverticular abscess as described below. Metastatic disease is also a concern as well as possible primary liver neoplasm. 3. There is an indeterminate 1.8 cm hypodensity containing minimal fluid and air which abuts the superolateral aspect of the adjacent sigmoid colon and may represent evidence of diverticulitis with small diverticular abscess. 4. Aortic atherosclerosis. Atherosclerotic coronary artery disease. 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 Recommend thyroid ultrasound for further evaluation. (Ref: J Am Coll Radiol. 2015 Feb;12(2): 143-50). 6. 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 Few subcentimeter left renal cortical hypodensities, too small to characterize, but likely cysts. Aortic Atherosclerosis (ICD10-I70.0). Electronically Signed   By: Marin Olp M.D.   On: 11/02/2020 15:01   MR LIVER W WO CONTRAST  Result Date: 11/02/2020 CLINICAL DATA:  Hepatic and left adrenal masses on recent CT. Abdominal pain. Diverticulitis. EXAM: MRI ABDOMEN WITHOUT AND WITH CONTRAST TECHNIQUE: Multiplanar multisequence MR imaging of the  abdomen was performed both before and after the administration of intravenous contrast. CONTRAST:  57mL GADAVIST  GADOBUTROL 1 MMOL/ML IV SOLN COMPARISON:  CT on 11/02/2020 and 05/02/2015 FINDINGS: Lower chest: No acute findings. Hepatobiliary: Image degradation by motion artifact noted. Diffuse hepatic steatosis is noted. Two adjacent complex cystic lesions are seen in the posterior right hepatic lobe which measure 7.0 cm and 8.6 cm in maximum diameter. These both show numerous thin internal septations and lack of solid component, highly suspicious for hepatic abscesses. Gallbladder is unremarkable. No evidence of biliary ductal dilatation. Pancreas:  No mass or inflammatory changes. Spleen:  Within normal limits in size and appearance. Adrenals/Urinary Tract: A small left adrenal mass remains stable compared to CT in 2017, and shows signal dropout on chemical shift imaging, consistent with benign adrenal adenoma. Tiny left renal cyst is noted. No renal masses identified. No evidence of hydronephrosis. Stomach/Bowel: Visualized portion unremarkable. Vascular/Lymphatic: No pathologically enlarged lymph nodes identified. No acute vascular findings. Other:  None. Musculoskeletal:  No suspicious bone lesions identified. IMPRESSION: Two large complex cystic lesions in the posterior right hepatic lobe, highly suspicious for hepatic abscesses. Diffuse hepatic steatosis. Stable small benign left adrenal adenoma. Electronically Signed   By: Marlaine Hind M.D.   On: 11/02/2020 19:37   DG Chest Port 1 View  Result Date: 11/03/2020 CLINICAL DATA:  Acute respiratory failure. EXAM: PORTABLE CHEST 1 VIEW COMPARISON:  CTA chest and chest x-ray from yesterday. FINDINGS: The heart size and mediastinal contours are within normal limits. New mild pulmonary vascular congestion. Low lung volumes with mild bibasilar atelectasis. New possible trace right pleural effusion. No pneumothorax. No acute osseous abnormality. IMPRESSION: 1. New mild pulmonary vascular congestion and possible trace right pleural effusion. Electronically Signed   By:  Titus Dubin M.D.   On: 11/03/2020 18:46   CT IMAGE GUIDED DRAINAGE BY PERCUTANEOUS CATHETER  Result Date: 11/04/2020 INDICATION: Concern for hepatic abscess. Please perform CT-guided aspiration versus drainage catheter placement for infection source control purposes. EXAM: CT-GUIDED HEPATIC ABSCESS DRAINAGE CATHETER PLACEMENT X2 COMPARISON:  CT abdomen and pelvis-11/02/2020 MEDICATIONS: The patient is currently admitted to the hospital and receiving intravenous antibiotics. The antibiotics were administered within an appropriate time frame prior to the initiation of the procedure. ANESTHESIA/SEDATION: Moderate (conscious) sedation was employed during this procedure. A total of Versed 2 mg and Fentanyl 100 mcg was administered intravenously. Moderate Sedation Time: 27 minutes. The patient's level of consciousness and vital signs were monitored continuously by radiology nursing throughout the procedure under my direct supervision. CONTRAST:  None COMPLICATIONS: SIR LEVEL B - Normal therapy, includes overnight admission for observation. Patient developed postprocedural rigors requiring administration of IV demerol. PROCEDURE: Informed written consent was obtained from the patient after a discussion of the risks, benefits and alternatives to treatment. The patient was placed supine, slightly LPO on the CT gantry and a pre procedural CT was performed re-demonstrating the known abscess/fluid collections within the right lobe of the liver with dominant collection within the more cranial aspect of the right lobe of the liver measuring approximately 8.4 x 5.8 cm (image 21, series 2) and additional collection within the more caudal aspect of the right lobe of the liver measuring approximately 9.6 x 7.1 cm (image 34, series 2) however both collections were suboptimally visualized on acquired noncontrast images. Both collections were identified sonographically however again, both collections were suboptimally visualized  secondary to patient body habitus and extensive hepatic steatosis. The procedure was planned. A timeout was performed prior to  the initiation of the procedure. The skin overlying the right upper abdomen was prepped and draped in the usual sterile fashion. The overlying soft tissues were anesthetized with 1% lidocaine with epinephrine. Under direct ultrasound guidance, the dominant collection more caudal aspect of the right lobe of the liver was targeted with an 18 gauge trocar needle. Multiple ultrasound images were saved procedural documentation purposes. A small amount of purulent fluid was aspirated and a short Amplatz wire was coiled within the collection. Appropriate positioning was confirmed with CT imaging. Next, the track was dilated ultimately allowing placement of a 12 [place] all-purpose drainage catheter with end coiled and locked within the dominant hepatic abscess. Approximately 120 cc of bloody purulent fluid was aspirated from the collection. Postprocedural imaging demonstrates appropriate positioning of the initial drainage catheter with unchanged appearance of the additional collection within the more cranial aspect the right lobe of the liver. As such, decision was made to proceed with image guided placement of an additional hepatic abscess drainage catheter. Under direct ultrasound guidance, the remaining collection within more caudal aspect the right lobe of the liver was targeted with an 18 gauge trocar needle. Multiple ultrasound images were saved procedural documentation purposes. A small amount of purulent fluid was aspirated and a short Amplatz wire was coiled within the collection. Appropriate positioning was confirmed with CT imaging. The track was dilated, ultimately allowing placement of a 12 [place] all-purpose drainage catheter with end coiled and locked within the remaining hepatic abscess. Approximately 80 cc of bloody purulent fluid was aspirated from the collection. Postprocedural  imaging was obtained demonstrating appropriate positioning of both percutaneous drainage catheters Both drainage catheters were flushed with a small amount of saline and connected to gravity bags. Both drainage catheters were secured at the skin entrance site within interrupted sutures and StatLock devices. Dressings were applied. Patient developed rigors following placement of the drainage catheters which was treated with IV demerol upon her arrival back to the emergency department. The patient otherwise tolerated the procedure well without immediate postprocedural complication and remained hemodynamically stable throughout the procedure. IMPRESSION: 1. Successful ultrasound and CT-guided placement of 12 [place] all-purpose drainage catheters into both dominant hepatic abscesses within the right lobe of the liver yielding a total of 200 cc of bloody purulent fluid. A representative sample of aspirated fluid was capped and sent to the laboratory for analysis. 2. While the patient remained hemodynamically stable throughout the procedure, she developed postprocedural rigors which was subsequently treated with IV Demerol upon her arrival back to the emergency department. Electronically Signed   By: Sandi Mariscal M.D.   On: 11/04/2020 08:41   CT IMAGE GUIDED DRAINAGE BY PERCUTANEOUS CATHETER  Result Date: 11/04/2020 INDICATION: Concern for hepatic abscess. Please perform CT-guided aspiration versus drainage catheter placement for infection source control purposes. EXAM: CT-GUIDED HEPATIC ABSCESS DRAINAGE CATHETER PLACEMENT X2 COMPARISON:  CT abdomen and pelvis-11/02/2020 MEDICATIONS: The patient is currently admitted to the hospital and receiving intravenous antibiotics. The antibiotics were administered within an appropriate time frame prior to the initiation of the procedure. ANESTHESIA/SEDATION: Moderate (conscious) sedation was employed during this procedure. A total of Versed 2 mg and Fentanyl 100 mcg was  administered intravenously. Moderate Sedation Time: 27 minutes. The patient's level of consciousness and vital signs were monitored continuously by radiology nursing throughout the procedure under my direct supervision. CONTRAST:  None COMPLICATIONS: SIR LEVEL B - Normal therapy, includes overnight admission for observation. Patient developed postprocedural rigors requiring administration of IV demerol. PROCEDURE: Informed written consent  was obtained from the patient after a discussion of the risks, benefits and alternatives to treatment. The patient was placed supine, slightly LPO on the CT gantry and a pre procedural CT was performed re-demonstrating the known abscess/fluid collections within the right lobe of the liver with dominant collection within the more cranial aspect of the right lobe of the liver measuring approximately 8.4 x 5.8 cm (image 21, series 2) and additional collection within the more caudal aspect of the right lobe of the liver measuring approximately 9.6 x 7.1 cm (image 34, series 2) however both collections were suboptimally visualized on acquired noncontrast images. Both collections were identified sonographically however again, both collections were suboptimally visualized secondary to patient body habitus and extensive hepatic steatosis. The procedure was planned. A timeout was performed prior to the initiation of the procedure. The skin overlying the right upper abdomen was prepped and draped in the usual sterile fashion. The overlying soft tissues were anesthetized with 1% lidocaine with epinephrine. Under direct ultrasound guidance, the dominant collection more caudal aspect of the right lobe of the liver was targeted with an 18 gauge trocar needle. Multiple ultrasound images were saved procedural documentation purposes. A small amount of purulent fluid was aspirated and a short Amplatz wire was coiled within the collection. Appropriate positioning was confirmed with CT imaging. Next,  the track was dilated ultimately allowing placement of a 12 [place] all-purpose drainage catheter with end coiled and locked within the dominant hepatic abscess. Approximately 120 cc of bloody purulent fluid was aspirated from the collection. Postprocedural imaging demonstrates appropriate positioning of the initial drainage catheter with unchanged appearance of the additional collection within the more cranial aspect the right lobe of the liver. As such, decision was made to proceed with image guided placement of an additional hepatic abscess drainage catheter. Under direct ultrasound guidance, the remaining collection within more caudal aspect the right lobe of the liver was targeted with an 18 gauge trocar needle. Multiple ultrasound images were saved procedural documentation purposes. A small amount of purulent fluid was aspirated and a short Amplatz wire was coiled within the collection. Appropriate positioning was confirmed with CT imaging. The track was dilated, ultimately allowing placement of a 12 [place] all-purpose drainage catheter with end coiled and locked within the remaining hepatic abscess. Approximately 80 cc of bloody purulent fluid was aspirated from the collection. Postprocedural imaging was obtained demonstrating appropriate positioning of both percutaneous drainage catheters Both drainage catheters were flushed with a small amount of saline and connected to gravity bags. Both drainage catheters were secured at the skin entrance site within interrupted sutures and StatLock devices. Dressings were applied. Patient developed rigors following placement of the drainage catheters which was treated with IV demerol upon her arrival back to the emergency department. The patient otherwise tolerated the procedure well without immediate postprocedural complication and remained hemodynamically stable throughout the procedure. IMPRESSION: 1. Successful ultrasound and CT-guided placement of 12 [place]  all-purpose drainage catheters into both dominant hepatic abscesses within the right lobe of the liver yielding a total of 200 cc of bloody purulent fluid. A representative sample of aspirated fluid was capped and sent to the laboratory for analysis. 2. While the patient remained hemodynamically stable throughout the procedure, she developed postprocedural rigors which was subsequently treated with IV Demerol upon her arrival back to the emergency department. Electronically Signed   By: Sandi Mariscal M.D.   On: 11/04/2020 08:41     ASSESSMENT AND PLAN: Paroxysmal atrial fibrillation with rapid ventricular  response rate with underlying sepsis.  Patient apparently is now in sinus rhythm.  Advise adding amiodarone 200 p.o. twice daily to keep it in sinus rhythm.  Also advise anticoagulants.  Monzerrat Wellen A

## 2020-11-04 NOTE — Consult Note (Signed)
NAME: Stacy Cox  DOB: February 20, 1955  MRN: [id number]  Date/Time: 11/04/2020 1:58 PM  REQUESTING PROVIDER: Dr.wieting Subjective:  REASON FOR CONSULT:  ? Stacy Cox is a 65 y.o. with a history of DM, HTN, CHFpresents with fever of 1 week , diarrhea , poor appetitie on 11/02/20 Also having low abdominal pain As per patient she has had diverticulitis  X 2 in the past She had a left L5-s1 transforaminal epidural injection under fluoroscopy on 10/07/20 and 2 weeks later she had pain left lower quadrant and initially thought it was sequelae of  the injection as it was deep.  In the ED vitals 90/56, temp 99.1, pulse 108, RR 28 and sats 92%. Wbc was 21.9, HB 15, PLT 196, cr 1.51 Blood culture sent- CT abdomen revealed 2 mass like area in the liver of 7.8X9.2 cm and 5.5 X 8.8 cm There was also a 1.8cm hypodensity with air and fluid adjacent to the sigmoid colon with evidence of diverticulitis and small diverticular abscess She was started on vanco and piptazo- surgeon saw the patient  and IR put 2 drains in the liver abscess and drained close to 200cc of bloody fluid. She had severe rigors following that and got demerol- she was admitted to step down because of severe tachycardia,which was brief AFIB with RVR hypotension-and sob - she is much better today I am seeing the patient as blood culture positive for streptococcus.  Past Medical History:  Diagnosis Date   CHF (congestive heart failure) (HCC)    COPD (chronic obstructive pulmonary disease) (HCC)    Diabetes mellitus, type 2 (HCC)    GERD (gastroesophageal reflux disease)    Heart disease    Hypertension    Joint pain    Polycythemia vera ([place])    Tobacco abuse    PSH Eye surgery Social History   Socioeconomic History   Marital status: Divorced    Spouse name: Not on file   Number of children: Not on file   Years of education: Not on file   Highest education level: Not on file  Occupational History   Not on file  Tobacco Use    Smoking status: Every Day    Packs/day: 0.25    Years: 40.00    Pack years: 10.00    Types: Cigarettes   Smokeless tobacco: Never   Tobacco comments:    smoking since teens  Substance and Sexual Activity   Alcohol use: No    Alcohol/week: 0.0 standard drinks   Drug use: No   Sexual activity: Not Currently  Other Topics Concern   Not on file  Social History Narrative   Not on file   Social Determinants of Health   Financial Resource Strain: Not on file  Food Insecurity: Not on file  Transportation Needs: Not on file  Physical Activity: Not on file  Stress: Not on file  Social Connections: Not on file  Intimate Partner Violence: Not on file    Family History  Problem Relation Age of Onset   Breast cancer Sister        sister #1 age 8   Kidney cancer Sister        sister #1 age -68's   Breast cancer Sister        sister #2 age 61's   Kidney disease Sister        sister #2 age 46's   Diabetes Mother    Heart disease Mother        + BYPASS  SURGERY   Deep vein thrombosis Mother    Arthritis Mother    Diabetes Sister    Heart disease Sister    Hypertension Brother    Heart attack Father    No Known Allergies I? Current Facility-Administered Medications  Medication Dose Route Frequency Provider Last Rate Last Admin   0.9 %  sodium chloride infusion  250 mL Intravenous Continuous Milus Banister, NP   Stopped at 11/04/20 0534   acetaminophen (TYLENOL) tablet 650 mg  650 mg Oral Q6H PRN Athena Masse, MD   650 mg at 11/02/20 2035   Or   acetaminophen (TYLENOL) suppository 650 mg  650 mg Rectal Q6H PRN Athena Masse, MD       amiodarone (PACERONE) tablet 200 mg  200 mg Oral Daily Neoma Laming A, MD   200 mg at 11/04/20 1104   Chlorhexidine Gluconate Cloth 2 % PADS 6 each  6 each Topical Q0600 Loletha Grayer, MD   6 each at 11/04/20 0535   dextromethorphan-guaiFENesin ([place] [facility]) 30-600 MG per 12 hr tablet 1 tablet  1 tablet Oral BID Rust-Chester, Huel Cote, NP    1 tablet at 11/04/20 1103   HYDROcodone-acetaminophen (NORCO/VICODIN) 5-325 MG per tablet 1-2 tablet  1-2 tablet Oral Q4H PRN Athena Masse, MD   1 tablet at 11/04/20 1104   insulin aspart (novoLOG) injection 0-15 Units  0-15 Units Subcutaneous Q4H Judd Gaudier V, MD   2 Units at 11/04/20 1119   ipratropium-albuterol (DUONEB) 0.5-2.5 (3) MG/3ML nebulizer solution 3 mL  3 mL Nebulization Q6H PRN Milus Banister, NP       lactated ringers infusion   Intravenous Continuous Loletha Grayer, MD 100 mL/hr at 11/04/20 1300 Infusion Verify at 11/04/20 1300   metoprolol tartrate (LOPRESSOR) injection 5 mg  5 mg Intravenous Q1H PRN Loletha Grayer, MD   5 mg at 11/03/20 1710   morphine 2 MG/ML injection 2 mg  2 mg Intravenous Q2H PRN Athena Masse, MD   2 mg at 11/03/20 2146   ondansetron (ZOFRAN) tablet 4 mg  4 mg Oral Q6H PRN Athena Masse, MD       Or   ondansetron [facility]) injection 4 mg  4 mg Intravenous Q6H PRN Athena Masse, MD       piperacillin-tazobactam (ZOSYN) IVPB 3.375 g  3.375 g Intravenous Q8H Darnelle Bos, RPH   Stopped at 11/04/20 0935   sodium chloride flush (NS) 0.9 % injection 5 mL  5 mL Intracatheter Q8H Sandi Mariscal, MD       vancomycin (VANCOREADY) IVPB 1500 mg/300 mL  1,500 mg Intravenous Q24H Noralee Space, RPH   Stopped at 11/03/20 2341     Abtx:  Anti-infectives (From admission, onward)    Start     Dose/Rate Route Frequency Ordered Stop   11/03/20 2100  vancomycin (VANCOREADY) IVPB 1500 mg/300 mL        1,500 mg 150 mL/hr over 120 Minutes Intravenous Every 24 hours 11/03/20 1944     11/02/20 2100  piperacillin-tazobactam (ZOSYN) IVPB 3.375 g        3.375 g 12.5 mL/hr over 240 Minutes Intravenous Every 8 hours 11/02/20 2055     11/02/20 1445  piperacillin-tazobactam (ZOSYN) IVPB 3.375 g        3.375 g 100 mL/hr over 30 Minutes Intravenous  Once 11/02/20 1439 11/02/20 1516   11/02/20 1430  vancomycin (VANCOREADY) IVPB 2000 mg/400 mL  2,000 mg 200  mL/hr over 120 Minutes Intravenous  Once 11/02/20 1429 11/02/20 1829   11/02/20 1430  piperacillin-tazobactam (ZOSYN) IVPB 3.375 g  Status:  Discontinued        3.375 g 150 mL/hr over 30 Minutes Intravenous  Once 11/02/20 1429 11/02/20 1439       REVIEW OF SYSTEMS:  Const:  fever,  chills, negative weight loss Eyes: negative diplopia or visual changes, negative eye pain ENT: negative coryza, negative sore throat Resp: negative cough, hemoptysis, dyspnea Cards: negative for chest pain, palpitations, lower extremity edema GU: negative for frequency, dysuria and hematuria GI:  abdominal pain, diarrhea, no bleeding, constipation Skin: negative for rash and pruritus Heme: negative for easy bruising and gum/nose bleeding MS: , back pain and muscle weakness Neurolo:negative for headaches, dizziness, vertigo, memory problems  Psych: negative for feelings of anxiety, depression  Endocrine:  diabetes Allergy/Immunology- negative for any medication or food allergies ? Objective:  VITALS:  BP 108/83   Pulse 70   Temp 97.9 F (36.6 C) (Oral)   Resp 14   Ht 5\' 8"  (1.727 m)   Wt 111.7 kg   SpO2 96%   BMI 37.44 kg/m  PHYSICAL EXAM:  General: Alert, cooperative, no distress, appears stated age.  Head: Normocephalic, without obvious abnormality, atraumatic. Eyes: Conjunctivae clear, anicteric sclerae. Pupils are equal ENT Nares normal. No drainage or sinus tenderness. Lips, mucosa, and tongue normal. No Thrush, many missing teeth Neck: Supple, symmetrical, no adenopathy, thyroid: non tender no carotid bruit and no JVD. Back: No CVA tenderness. Lungs: Clear to auscultation bilaterally. No Wheezing or Rhonchi. No rales. Heart: Regular rate and rhythm, no murmur, rub or gallop. Abdomen: Soft, rt upper quadrant 2 drains- bloody fluid Extremities: atraumatic, no cyanosis. No edema. No clubbing Skin: No rashes or lesions. Or bruising Lymph: Cervical, supraclavicular normal. Neurologic:  Grossly non-focal Pertinent Labs Lab Results CBC    Component Value Date/Time   WBC 26.9 (H) 11/04/2020 0614   RBC 3.91 11/04/2020 0614   HGB 12.6 11/04/2020 0614   HGB 16.3 (H) 06/25/2012 1909   HCT 34.5 (L) 11/04/2020 0614   HCT 46.7 06/25/2012 1909   PLT 167 11/04/2020 0614   PLT 249 06/25/2012 1909   MCV 88.2 11/04/2020 0614   MCV 93 06/25/2012 1909   MCH 32.2 11/04/2020 0614   MCHC 36.5 (H) 11/04/2020 0614   RDW 14.3 11/04/2020 0614   RDW 14.1 06/25/2012 1909   LYMPHSABS 2.0 11/04/2020 0614   LYMPHSABS 2.4 10/31/2011 0508   MONOABS 1.9 (H) 11/04/2020 0614   MONOABS 0.8 10/31/2011 0508   EOSABS 0.1 11/04/2020 0614   EOSABS 0.1 10/31/2011 0508   BASOSABS 0.0 11/04/2020 0614   BASOSABS 0.1 10/31/2011 0508    CMP Latest Ref Rng & Units 11/04/2020 11/03/2020 11/03/2020  Glucose 70 - 99 mg/dL 152(H) - 111(H)  BUN 8 - 23 mg/dL 18 - 17  Creatinine 0.44 - 1.00 mg/dL 0.80 - 0.94  Sodium 135 - 145 mmol/L 134(L) - 137  Potassium 3.5 - 5.1 mmol/L 3.4(L) 3.6 3.2(L)  Chloride 98 - 111 mmol/L 101 - 102  CO2 22 - 32 mmol/L 26 - 24  Calcium 8.9 - 10.3 mg/dL 7.7(L) - 8.4(L)  Total Protein 6.5 - 8.1 g/dL 5.3(L) - 7.1  Total Bilirubin 0.3 - 1.2 mg/dL 3.3(H) - 3.2(H)  Alkaline Phos 38 - 126 U/L 106 - 131(H)  AST 15 - 41 U/L 66(H) - 59(H)  ALT 0 - 44 U/L 61(H) - 65(H)  Microbiology: Recent Results (from the past 240 hour(s))  Urine Culture     Status: None   Collection Time: 11/02/20  1:23 PM   Specimen: Urine, Clean Catch  Result Value Ref Range Status   Specimen Description   Final    URINE, CLEAN CATCH Performed at [facility], [address] [place], [place] [address]    Special Requests   Final    NONE Performed at [facility], [address] [place], [place] [address]    Culture   Final    NO GROWTH Performed at [facility], [place] [address], [place], [place] [address]    Report Status 11/03/2020 FINAL  Final  Blood culture (routine  x 2)     Status: None (Preliminary result)   Collection Time: 11/02/20  1:24 PM   Specimen: BLOOD  Result Value Ref Range Status   Specimen Description BLOOD LEFT ANTECUBITAL  Final   Special Requests   Final    BOTTLES DRAWN AEROBIC ONLY Blood Culture results may not be optimal due to an inadequate volume of blood received in culture bottles   Culture  Setup Time   Final    GRAM POSITIVE COCCI ANAEROBIC BOTTLE ONLY Organism ID to follow CRITICAL RESULT CALLED TO, READ BACK BY AND VERIFIED WITH: PHARMD S WATSON [id number] AT 1233 BY CM Performed at [facility], [place] [address] [place], [place] [address]    Culture GRAM POSITIVE COCCI  Final   Report Status PENDING  Incomplete  SARS CORONAVIRUS 2 (TAT 6-24 HRS) Nasopharyngeal Nasopharyngeal Swab     Status: None   Collection Time: 11/02/20  1:24 PM   Specimen: Nasopharyngeal Swab  Result Value Ref Range Status   SARS Coronavirus 2 NEGATIVE NEGATIVE Final    Comment: (NOTE) SARS-CoV-2 target nucleic acids are NOT DETECTED.  The SARS-CoV-2 RNA is generally detectable in upper and lower respiratory specimens during the acute phase of infection. Negative results do not preclude SARS-CoV-2 infection, do not rule out co-infections with other pathogens, and should not be used as the sole basis for treatment or other patient management decisions. Negative results must be combined with clinical observations, patient history, and epidemiological information. The expected result is Negative.  Fact Sheet for Patients: SugarRoll.be  Fact Sheet for Healthcare Providers: https://www.woods-mathews.com/  This test is not yet approved or cleared by the [place] FDA and  has been authorized for detection and/or diagnosis of SARS-CoV-2 by FDA under an Emergency Use Authorization (EUA). This EUA will remain  in effect (meaning this test can be used) for the duration of the COVID-19 declaration  under Se ction 564(b)(1) of the Act, 21 U.S.C. section 360bbb-3(b)(1), unless the authorization is terminated or revoked sooner.  Performed at [facility], [place] [address] [place], [place] [address]   Blood Culture ID Panel (Reflexed)     Status: Abnormal   Collection Time: 11/02/20  1:24 PM  Result Value Ref Range Status   Enterococcus faecalis NOT DETECTED NOT DETECTED Final   Enterococcus Faecium NOT DETECTED NOT DETECTED Final   Listeria monocytogenes NOT DETECTED NOT DETECTED Final   Staphylococcus species NOT DETECTED NOT DETECTED Final   Staphylococcus aureus (BCID) NOT DETECTED NOT DETECTED Final   Staphylococcus epidermidis NOT DETECTED NOT DETECTED Final   Staphylococcus lugdunensis NOT DETECTED NOT DETECTED Final   Streptococcus species DETECTED (A) NOT DETECTED Final    Comment: Not Enterococcus species, Streptococcus agalactiae, Streptococcus pyogenes, or Streptococcus pneumoniae.  CRITICAL RESULT CALLED TO, READ BACK BY AND VERIFIED WITH: PHARMD S WATSON [id number] AT 1233 BY CM    Streptococcus agalactiae NOT DETECTED NOT DETECTED Final   Streptococcus pneumoniae NOT DETECTED NOT DETECTED Final   Streptococcus pyogenes NOT DETECTED NOT DETECTED Final   A.calcoaceticus-baumannii NOT DETECTED NOT DETECTED Final   Bacteroides fragilis NOT DETECTED NOT DETECTED Final   Enterobacterales NOT DETECTED NOT DETECTED Final   Enterobacter cloacae complex NOT DETECTED NOT DETECTED Final   Escherichia coli NOT DETECTED NOT DETECTED Final   Klebsiella aerogenes NOT DETECTED NOT DETECTED Final   Klebsiella oxytoca NOT DETECTED NOT DETECTED Final   Klebsiella pneumoniae NOT DETECTED NOT DETECTED Final   Proteus species NOT DETECTED NOT DETECTED Final   Salmonella species NOT DETECTED NOT DETECTED Final   Serratia marcescens NOT DETECTED NOT DETECTED Final   Haemophilus influenzae NOT DETECTED NOT DETECTED Final   Neisseria meningitidis NOT DETECTED NOT DETECTED Final    Pseudomonas aeruginosa NOT DETECTED NOT DETECTED Final   Stenotrophomonas maltophilia NOT DETECTED NOT DETECTED Final   Candida albicans NOT DETECTED NOT DETECTED Final   Candida auris NOT DETECTED NOT DETECTED Final   Candida glabrata NOT DETECTED NOT DETECTED Final   Candida krusei NOT DETECTED NOT DETECTED Final   Candida parapsilosis NOT DETECTED NOT DETECTED Final   Candida tropicalis NOT DETECTED NOT DETECTED Final   Cryptococcus neoformans/gattii NOT DETECTED NOT DETECTED Final    Comment: Performed at [facility], [address], [place], [place] [address]  Blood culture (routine x 2)     Status: None (Preliminary result)   Collection Time: 11/02/20  1:26 PM   Specimen: BLOOD  Result Value Ref Range Status   Specimen Description BLOOD RIGHT ANTECUBITAL  Final   Special Requests   Final    BOTTLES DRAWN AEROBIC AND ANAEROBIC Blood Culture results may not be optimal due to an inadequate volume of blood received in culture bottles   Culture   Final    NO GROWTH 2 DAYS Performed at [facility], [address] [place], [place] [address]    Report Status PENDING  Incomplete  Gastrointestinal Panel by PCR , Stool     Status: None   Collection Time: 11/02/20  9:13 PM   Specimen: Stool  Result Value Ref Range Status   Campylobacter species NOT DETECTED NOT DETECTED Final   Plesimonas shigelloides NOT DETECTED NOT DETECTED Final   Salmonella species NOT DETECTED NOT DETECTED Final   Yersinia enterocolitica NOT DETECTED NOT DETECTED Final   Vibrio species NOT DETECTED NOT DETECTED Final   Vibrio cholerae NOT DETECTED NOT DETECTED Final   Enteroaggregative E coli (EAEC) NOT DETECTED NOT DETECTED Final   Enteropathogenic E coli (EPEC) NOT DETECTED NOT DETECTED Final   Enterotoxigenic E coli (ETEC) NOT DETECTED NOT DETECTED Final   Shiga like toxin producing E coli (STEC) NOT DETECTED NOT DETECTED Final   Shigella/Enteroinvasive E coli (EIEC) NOT DETECTED NOT DETECTED  Final   Cryptosporidium NOT DETECTED NOT DETECTED Final   Cyclospora cayetanensis NOT DETECTED NOT DETECTED Final   Entamoeba histolytica NOT DETECTED NOT DETECTED Final   Giardia lamblia NOT DETECTED NOT DETECTED Final   Adenovirus F40/41 NOT DETECTED NOT DETECTED Final   Astrovirus NOT DETECTED NOT DETECTED Final   Norovirus GI/GII NOT DETECTED NOT DETECTED Final   Rotavirus A NOT DETECTED NOT DETECTED Final   Sapovirus (I, II, IV, and V) NOT DETECTED NOT DETECTED Final    Comment: Performed  at [facility], [address], [place], [place] [address]  Aerobic/Anaerobic Culture w Gram Stain (surgical/deep wound)     Status: None (Preliminary result)   Collection Time: 11/03/20  4:30 PM   Specimen: Abscess  Result Value Ref Range Status   Specimen Description   Final    ABSCESS Performed at [facility], [address] [place], [place] [address]    Special Requests   Final    NONE Performed at [facility], [address] [place], [place] [address]    Gram Stain   Final    NO ORGANISMS SEEN SQUAMOUS EPITHELIAL CELLS PRESENT ABUNDANT WBC PRESENT,BOTH PMN AND MONONUCLEAR ABUNDANT GRAM POSITIVE COCCI    Culture   Final    NO GROWTH < 12 HOURS Performed at [facility], [place] [address] [place], [place] [address]    Report Status PENDING  Incomplete  MRSA Next Gen by PCR, Nasal     Status: None   Collection Time: 11/03/20  8:22 PM   Specimen: Nasal Mucosa; Nasal Swab  Result Value Ref Range Status   MRSA by PCR Next Gen NOT DETECTED NOT DETECTED Final    Comment: (NOTE) The GeneXpert MRSA Assay (FDA approved for NASAL specimens only), is one component of a comprehensive MRSA colonization surveillance program. It is not intended to diagnose MRSA infection nor to guide or monitor treatment for MRSA infections. Test performance is not FDA approved in patients less than 8 years old. Performed at [facility], [address]  [place], [place] [address]     IMAGING RESULTS:  I have personally reviewed the films ?disease. 2. Two adjacent masslike areas of low attenuation over the right lobe of the liver with the larger measuring 7.8 x 9.2 cm and more inferiorly over the right lobe measuring 5.5 x 8.8 cm. Findings may be due to infection/abscess in light of possible diverticular abscess as described below. Metastatic disease is also a concern as well as possible primary liver neoplasm.  ultrasound and CT-guided placement of 12 French all-purpose drainage catheters into both dominant hepatic abscesses within the right lobe of the liver yielding a total of 200 cc of bloody purulent fluid. A representative sample of aspirated fluid was capped and sent to the laboratory for analysis.  Impression/Recommendation ?Liver abscesses very likely secondary to perforated sigmoid diverticulitis with contained abscess- On vanco/zosyn. DC vanco. Await cultures to finalize Sepsis secondary to the above -leucocytosis worsened after she got the drain which is expected  Streptococcus bacteremia- likely viridans group . Source GI tract- liver abscess Zosyn will cover   Afib - secondary to sepsis- now in sinus rhythm. . On diltiazem H/o diverticulitis   DM  HTN- BP soft in the hospital due to infection  Electrolyte imbalance being corrected ? ? ___________________________________________________ Discussed with patient, requesting provider Note:  This document was prepared using Dragon voice recognition software and may include unintentional dictation errors.

## 2020-11-04 NOTE — Progress Notes (Signed)
Patient is resting in bed. Alert and oriented x4. C/o pain rated an 8/10 to the right flank. Medicated with PRN pain medication. Drains noted with sanguinous drainage noted in the bags. Both are clotted off and only one was able to be emptied. Providers were notified. Call bell in reach. Will continue to monitor.

## 2020-11-04 NOTE — Progress Notes (Signed)
Spoke with Dr. Delaine Lame via secure chat and asked about need for enteric precaution. MD gave order to discontinue enteric precautions.

## 2020-11-05 DIAGNOSIS — R7881 Bacteremia: Secondary | ICD-10-CM

## 2020-11-05 DIAGNOSIS — K750 Abscess of liver: Secondary | ICD-10-CM | POA: Diagnosis not present

## 2020-11-05 DIAGNOSIS — A419 Sepsis, unspecified organism: Secondary | ICD-10-CM | POA: Diagnosis not present

## 2020-11-05 DIAGNOSIS — K5792 Diverticulitis of intestine, part unspecified, without perforation or abscess without bleeding: Secondary | ICD-10-CM | POA: Diagnosis not present

## 2020-11-05 LAB — GLUCOSE, CAPILLARY
Glucose-Capillary: 84 mg/dL (ref 70–99)
Glucose-Capillary: 98 mg/dL (ref 70–99)
Glucose-Capillary: 131 mg/dL — ABNORMAL HIGH (ref 70–99)
Glucose-Capillary: 78 mg/dL (ref 70–99)
Glucose-Capillary: 126 mg/dL — ABNORMAL HIGH (ref 70–99)
Glucose-Capillary: 91 mg/dL (ref 70–99)

## 2020-11-05 LAB — BASIC METABOLIC PANEL
Anion gap: 8 (ref 5–15)
BUN: 16 mg/dL (ref 8–23)
CO2: 27 mmol/L (ref 22–32)
Calcium: 8 mg/dL — ABNORMAL LOW (ref 8.9–10.3)
Chloride: 102 mmol/L (ref 98–111)
Creatinine, Ser: 0.61 mg/dL (ref 0.44–1.00)
GFR, Estimated: >60 mL/min (ref >60)
Glucose, Bld: 89 mg/dL (ref 70–99)
Potassium: 3.4 mmol/L — ABNORMAL LOW (ref 3.5–5.1)
Sodium: 137 mmol/L (ref 135–145)

## 2020-11-05 LAB — CBC
HCT: 36.5 % (ref 36.0–46.0)
Hemoglobin: 13.2 g/dL (ref 12.0–15.0)
MCH: 32 pg (ref 26.0–34.0)
MCHC: 36.2 g/dL — ABNORMAL HIGH (ref 30.0–36.0)
MCV: 88.4 fL (ref 80.0–100.0)
Platelets: 200 10*3/uL (ref 150–400)
RBC: 4.13 MIL/uL (ref 3.87–5.11)
RDW: 14.4 % (ref 11.5–15.5)
WBC: 18.7 10*3/uL — ABNORMAL HIGH (ref 4.0–10.5)
nRBC: 0 % (ref 0.0–0.2)

## 2020-11-05 LAB — PROCALCITONIN: Procalcitonin: 50.17 ng/mL

## 2020-11-05 MED ORDER — POTASSIUM CHLORIDE CRYS ER 20 MEQ PO TBCR
40.0000 meq | EXTENDED_RELEASE_TABLET | Freq: Two times a day (BID) | ORAL | Status: AC
  Administered 2020-11-05 (×2): 40 meq via ORAL
  Filled 2020-11-05 (×2): qty 2

## 2020-11-05 MED ORDER — SODIUM CHLORIDE 0.9 % IV SOLN
3.0000 g | Freq: Four times a day (QID) | INTRAVENOUS | Status: DC
  Administered 2020-11-06 – 2020-11-07 (×5): 3 g via INTRAVENOUS
  Filled 2020-11-05 (×4): qty 8
  Filled 2020-11-05: qty 3
  Filled 2020-11-05: qty 8

## 2020-11-05 MED ORDER — ORAL CARE MOUTH RINSE
15.0000 mL | Freq: Two times a day (BID) | OROMUCOSAL | Status: DC
  Administered 2020-11-06 – 2020-11-07 (×3): 15 mL via OROMUCOSAL

## 2020-11-05 NOTE — Progress Notes (Signed)
SUBJECTIVE: Stacy Cox is a 65 y.o. female past medical history of COPD, CHF, diabetes, GERD, hypertension, hyperlipidemia, polycythemia vera who presented with fevers, shortness of breath cough etc.  Patient notes that all of her symptoms started about 1 week ago.    Cardiology consulted for atrial fibrillation with RVR. Patient converted. Now taking amiodarone 200 mg twice daily. Patient denies current chest pain, shortness of breath.   Vitals:   11/05/20 0600 11/05/20 0700 11/05/20 0805 11/05/20 1000  BP: 107/69 115/60 113/60 133/72  Pulse: 70 81 66 76  Resp: (!) 30 20 (!) 30 (!) 22  Temp:  98.7 F (37.1 C)    TempSrc:      SpO2: 96% 97% 94% 95%  Weight:      Height:        Intake/Output Summary (Last 24 hours) at 11/05/2020 1030 Last data filed at 11/05/2020 0616 Gross per 24 hour  Intake 1821.28 ml  Output 1670 ml  Net 151.28 ml    LABS: Basic Metabolic Panel: Recent Labs    11/03/20 1816 11/03/20 2224 11/04/20 0614 11/05/20 0413  NA  --   --  134* 137  K 3.6  --  3.4* 3.4*  CL  --   --  101 102  CO2  --   --  26 27  GLUCOSE  --   --  152* 89  BUN  --   --  18 16  CREATININE  --   --  0.80 0.61  CALCIUM  --   --  7.7* 8.0*  MG 1.6*  --  2.4  --   PHOS 1.3* 1.6* 4.4  --    Liver Function Tests: Recent Labs    11/03/20 0619 11/04/20 0614  AST 59* 66*  ALT 65* 61*  ALKPHOS 131* 106  BILITOT 3.2* 3.3*  PROT 7.1 5.3*  ALBUMIN 2.6* 2.1*   Recent Labs    11/02/20 1219  LIPASE 17   CBC: Recent Labs    11/03/20 1804 11/04/20 0614 11/04/20 1425 11/05/20 0413  WBC 13.0* 26.9*  --  18.7*  NEUTROABS 12.0* 22.4*  --   --   HGB 15.1* 12.6 13.2 13.2  HCT 41.2 34.5*  --  36.5  MCV 89.2 88.2  --  88.4  PLT 189 167  --  200   Cardiac Enzymes: No results for input(s): CKTOTAL, CKMB, CKMBINDEX, TROPONINI in the last 72 hours. BNP: Invalid input(s): POCBNP D-Dimer: No results for input(s): DDIMER in the last 72 hours. Hemoglobin A1C: Recent Labs     11/02/20 2141  HGBA1C 6.9*   Fasting Lipid Panel: No results for input(s): CHOL, HDL, LDLCALC, TRIG, CHOLHDL, LDLDIRECT in the last 72 hours. Thyroid Function Tests: No results for input(s): TSH, T4TOTAL, T3FREE, THYROIDAB in the last 72 hours.  Invalid input(s): FREET3 Anemia Panel: No results for input(s): VITAMINB12, FOLATE, FERRITIN, TIBC, IRON, RETICCTPCT in the last 72 hours.   PHYSICAL EXAM General: Well developed, well nourished, in no acute distress HEENT:  Normocephalic and atramatic Neck:  No JVD.  Lungs: Clear bilaterally to auscultation and percussion. Heart: HRRR . Normal S1 and S2 without gallops or murmurs.  Abdomen: Bowel sounds are positive, abdomen soft and non-tender  Msk:  Back normal, normal gait. Normal strength and tone for age. Extremities: No clubbing, cyanosis or edema.   Neuro: Alert and oriented X 3. Psych:  Good affect, responds appropriately  TELEMETRY: NSR, HR 82  ASSESSMENT AND PLAN: Patient currently in sinus rhythm. Continue  current amiodarone dose. Will continue to observe.  Principal Problem:   Sepsis secondary to acute diverticulitis Active Problems:   Polycythemia vera ([place])   Acute diverticulitis   Tobacco abuse   Type 2 diabetes mellitus    Liver abscess   COPD (chronic obstructive pulmonary disease) (HCC)   CHF (congestive heart failure) (HCC)   Drop in hemoglobin   AF (paroxysmal atrial fibrillation) ([place])    [name], FNP-C 11/05/2020 10:30 AM

## 2020-11-05 NOTE — Progress Notes (Signed)
[facility] SURGICAL PROGRESS NOTE (cpt [id number])  Hospital Day(s): 3.   Interval History: Patient seen and examined. No nothing acute overnight. Patient reports she continues to feel better. Some soreness at the drain site with movement but otherwise no abdominal pain. No fever, chills, nausea, emesis. Leukocytosis making significant improvements; down to 18.7K this morning. Renal function normal; sCr - 0.61; UO - 1550 ccs. Mild hypokalemia to 3.4. Abscess drains with 190 ccs out recorded; sanguinous. Cx from this are growing [place]. She continues on Zosyn. ID is following. She is tolerating diet.   Review of Systems:  Constitutional: denies fever, chills  HEENT: denies cough or congestion  Respiratory: denies any shortness of breath  Cardiovascular: denies chest pain or palpitations  Gastrointestinal: denies abdominal pain, N/V, or diarrhea/and bowel function as per interval history Genitourinary: denies burning with urination or urinary frequency  Vital signs in last 24 hours: [min-max] current  Temp:  [97.8 F (36.6 C)-99.6 F (37.6 C)] 99.1 F (37.3 C) (10/05 0400) Pulse Rate:  [29-93] 70 (10/05 0600) Resp:  [13-32] 30 (10/05 0600) BP: (83-139)/(49-112) 107/69 (10/05 0600) SpO2:  [88 %-100 %] 96 % (10/05 0600)     Height: 5\' 8"  (172.7 cm) Weight: 111.7 kg BMI (Calculated): 37.45   Intake/Output last 2 shifts:  10/04 0701 - 10/05 0700 In: 2159.9 [P.O.:480; I.V.:1543.2; IV Piggyback:136.7] Out: 1740 [Urine:1550; Drains:190]   Physical Exam:  Constitutional: alert, cooperative and no distress  HENT: normocephalic without obvious abnormality  Eyes: PERRL, EOM's grossly intact and symmetric  Respiratory: breathing non-labored at rest; on [place] Cardiovascular: regular rate and sinus rhythm  Gastrointestinal: Soft, mild soreness at drain site, non-distended, no rebound/guarding. Percutaneous drains x2 in the RUQ; output serosanguinous Musculoskeletal: no edema or wounds,  motor and sensation grossly intact, NT    Labs:  CBC Latest Ref Rng & Units 11/05/2020 11/04/2020 11/04/2020  WBC 4.0 - 10.5 K/uL 18.7(H) - 26.9(H)  Hemoglobin 12.0 - 15.0 g/dL 13.2 13.2 12.6  Hematocrit 36.0 - 46.0 % 36.5 - 34.5(L)  Platelets 150 - 400 K/uL 200 - 167   CMP Latest Ref Rng & Units 11/05/2020 11/04/2020 11/03/2020  Glucose 70 - 99 mg/dL 89 152(H) -  BUN 8 - 23 mg/dL 16 18 -  Creatinine 0.44 - 1.00 mg/dL 0.61 0.80 -  Sodium 135 - 145 mmol/L 137 134(L) -  Potassium 3.5 - 5.1 mmol/L 3.4(L) 3.4(L) 3.6  Chloride 98 - 111 mmol/L 102 101 -  CO2 22 - 32 mmol/L 27 26 -  Calcium 8.9 - 10.3 mg/dL 8.0(L) 7.7(L) -  Total Protein 6.5 - 8.1 g/dL - 5.3(L) -  Total Bilirubin 0.3 - 1.2 mg/dL - 3.3(H) -  Alkaline Phos 38 - 126 U/L - 106 -  AST 15 - 41 U/L - 66(H) -  ALT 0 - 44 U/L - 61(H) -     Imaging studies: No new pertinent imaging studies   Assessment/Plan: (ICD-10's: K45.92) 65 y.o. female with what sounds like transient bacteremia, now improving, follow percutaneous drainage of liver abscesses in the right posterior lobe and possible concomitant diverticulitis.    - Okay to continue diet as tolerated from surgical perspective - Continue IV Abx (Zosyn); follow up Cx and narrow; ID on board  - Monitor drains; record output daily -- Greatly appreciate IR assistance and recommendations - Pain control prn; antiemetics prn - Monitor leukocytosis; improving  - No emergent surgical interventions - Further management per primary service  - Nothing further to add from  surgical perspective, we will follow peripherally. She can follow up with [place] in 2-3 weeks. Follow up with IR as outpatient for drain management, we will be happy to help as well. Abx per ID; follow up Cx.    All of the above findings and recommendations were discussed with the patient, and the medical team, and all of patient's questions were answered to her expressed satisfaction.  -- Edison Simon, [name] [facility] 11/05/2020, 7:20 AM [phone] M-F: 7am - 4pm

## 2020-11-05 NOTE — Progress Notes (Signed)
PROGRESS NOTE    Stacy Cox  JSE:[id number] DOB: 1955/06/26 DOA: 11/02/2020 PCP: Donnie Coffin, MD    Brief Narrative:   65 year old female with Past medical history of COPD, CHF, type 2 diabetes mellitus, GERD, hypertension, polycythemia vera.  She was admitted to the hospital on 11/02/2020 with clinical sepsis acute diverticulitis and liver abscesses.  Interventional radiology was able to place a drain in the abscess on 11/03/2020.  Strep species growing out of 1 blood culture.  Abscess culture has gram-positive cocci.  After the patient is drained procedure yesterday patient was tachycardic up into the 150s and had altered mental status and tachypnea and was transferred to the ICU to stepdown.   Assessment & Plan:   Principal Problem:   Sepsis secondary to acute diverticulitis Active Problems:   Polycythemia vera (HCC)   Acute diverticulitis   Tobacco abuse   Type 2 diabetes mellitus    Liver abscess   COPD (chronic obstructive pulmonary disease) (HCC)   CHF (congestive heart failure) (HCC)   Drop in hemoglobin   AF (paroxysmal atrial fibrillation) ([place])  Sepsis secondary to liver abscess. Liver abscess secondary to acute diverticulitis. Acute diverticulitis. Drain from abscess has gram-positive cocci on gram stain, culture still pending. One of the 2 bottles of blood culture grows gram-positive cocci. Patient has been seen by ID, status post drain of the abscess. Continue antibiotics with Zosyn.  Relative hypotension appears to be due to sepsis. Essential hypertension. Chronic diastolic congestive heart failure. Patient currently does not have evidence of exacerbation of congestive heart failure. IV fluids discontinued  Polycythemia vera. Hemoglobin is 12.6, relatively low compared to baseline.  This could be due to IV fluid resuscitation.  Hemoglobin today is 13.2.  No evidence of bleeding.  Brief atrial fibrillation. Seen by cardiology.  May consider restart  Eliquis if hemoglobin is stable.  Type 2 diabetes with neuropathy. Continue to follow.  Hypokalemia Hypophosphatemia Hypomagnesemia. Continue replete potassium.  Morbid obesity. Patient has a BMI of 37, she also has essential hypertension diabetes. She is currently on 2 L oxygen with good saturation.  She probably has sleep apnea.  Her BNP is not elevated.  Continue wean off oxygen.   DVT prophylaxis: SCDs Code Status: Full Family Communication:  Disposition Plan:    Status is: Inpatient  Remains inpatient appropriate because:IV treatments appropriate due to intensity of illness or inability to take PO and Inpatient level of care appropriate due to severity of illness  Dispo: The patient is from: Home              Anticipated d/c is to: Home              Patient currently is not medically stable to d/c.   Difficult to place patient No        I/O last 3 completed shifts: In: 3813.2 [P.O.:480; I.V.:2742.3; IV Piggyback:590.9] Out: 2215 [Urine:1900; Drains:190; Other:125] No intake/output data recorded.     Consultants:  ID  Procedures:   Antimicrobials: Zosyn  Subjective: Patient doing better, no abdominal pain or nausea vomiting. No fever chills  No dysuria hematuria No short of breath or cough, she is on 2 L oxygen without hypoxia. No chest pain palpitation  Objective: Vitals:   11/05/20 0600 11/05/20 0700 11/05/20 0805 11/05/20 1000  BP: 107/69 115/60 113/60 133/72  Pulse: 70 81 66 76  Resp: (!) 30 20 (!) 30 (!) 22  Temp:  98.7 F (37.1 C)    TempSrc:  SpO2: 96% 97% 94% 95%  Weight:      Height:        Intake/Output Summary (Last 24 hours) at 11/05/2020 1232 Last data filed at 11/05/2020 0616 Gross per 24 hour  Intake 1621.25 ml  Output 1670 ml  Net -48.75 ml   Filed Weights   11/03/20 2015  Weight: 111.7 kg    Examination:  General exam: Appears calm and comfortable, obese obese Respiratory system: Clear to auscultation.  Respiratory effort normal. Cardiovascular system: S1 & S2 heard, RRR. No JVD, murmurs, rubs, gallops or clicks. No pedal edema. Gastrointestinal system: Abdomen is nondistended, soft and nontender. No organomegaly or masses felt. Normal bowel sounds heard. Central nervous system: Alert and oriented. No focal neurological deficits. Extremities: Symmetric 5 x 5 power. Skin: No rashes, lesions or ulcers Psychiatry:  Mood & affect appropriate.     Data Reviewed: I have personally reviewed following labs and imaging studies  CBC: Recent Labs  Lab 11/02/20 2141 11/03/20 0619 11/03/20 1804 11/04/20 0614 11/04/20 1425 11/05/20 0413  WBC 21.9* 23.6* 13.0* 26.9*  --  18.7*  NEUTROABS 16.8*  --  12.0* 22.4*  --   --   HGB 15.0 14.9 15.1* 12.6 13.2 13.2  HCT 41.3 42.3 41.2 34.5*  --  36.5  MCV 88.4 90.2 89.2 88.2  --  88.4  PLT 196 206 189 167  --  734   Basic Metabolic Panel: Recent Labs  Lab 11/02/20 1219 11/03/20 0619 11/03/20 1816 11/03/20 2224 11/04/20 0614 11/05/20 0413  NA 136 137  --   --  134* 137  K 3.4* 3.2* 3.6  --  3.4* 3.4*  CL 103 102  --   --  101 102  CO2 21* 24  --   --  26 27  GLUCOSE 222* 111*  --   --  152* 89  BUN 20 17  --   --  18 16  CREATININE 1.51* 0.94  --   --  0.80 0.61  CALCIUM 9.2 8.4*  --   --  7.7* 8.0*  MG  --  1.7 1.6*  --  2.4  --   PHOS  --   --  1.3* 1.6* 4.4  --    GFR: Estimated Creatinine Clearance: 95.5 mL/min (by C-G formula based on SCr of 0.61 mg/dL). Liver Function Tests: Recent Labs  Lab 11/02/20 1219 11/03/20 0619 11/04/20 0614  AST 64* 59* 66*  ALT 77* 65* 61*  ALKPHOS 135* 131* 106  BILITOT 2.9* 3.2* 3.3*  PROT 8.0 7.1 5.3*  ALBUMIN 3.1* 2.6* 2.1*   Recent Labs  Lab 11/02/20 1219  LIPASE 17   No results for input(s): AMMONIA in the last 168 hours. Coagulation Profile: Recent Labs  Lab 11/03/20 0619  INR 1.3*   Cardiac Enzymes: No results for input(s): CKTOTAL, CKMB, CKMBINDEX, TROPONINI in the last 168  hours. BNP (last 3 results) No results for input(s): PROBNP in the last 8760 hours. HbA1C: Recent Labs    11/02/20 2141  HGBA1C 6.9*   CBG: Recent Labs  Lab 11/04/20 1939 11/04/20 2333 11/05/20 0357 11/05/20 0807 11/05/20 1125  GLUCAP 104* 131* 84 98 131*   Lipid Profile: No results for input(s): CHOL, HDL, LDLCALC, TRIG, CHOLHDL, LDLDIRECT in the last 72 hours. Thyroid Function Tests: No results for input(s): TSH, T4TOTAL, FREET4, T3FREE, THYROIDAB in the last 72 hours. Anemia Panel: No results for input(s): VITAMINB12, FOLATE, FERRITIN, TIBC, IRON, RETICCTPCT in the last 72 hours. Sepsis Labs:  Recent Labs  Lab 11/02/20 1323 11/02/20 2127 11/03/20 0619 11/03/20 2224 11/04/20 0614 11/05/20 0413  PROCALCITON  --   --  38.72  --  75.94 50.17  LATICACIDVEN 3.2* 2.2*  --  2.3* 1.5  --     Recent Results (from the past 240 hour(s))  Urine Culture     Status: None   Collection Time: 11/02/20  1:23 PM   Specimen: Urine, Clean Catch  Result Value Ref Range Status   Specimen Description   Final    URINE, CLEAN CATCH Performed at [facility], [address] [place], [place] [address]    Special Requests   Final    NONE Performed at [facility], [address] [place], [place] [address]    Culture   Final    NO GROWTH Performed at [facility], [place] [address], [place], [place] [address]    Report Status 11/03/2020 FINAL  Final  Blood culture (routine x 2)     Status: None (Preliminary result)   Collection Time: 11/02/20  1:24 PM   Specimen: BLOOD  Result Value Ref Range Status   Specimen Description BLOOD LEFT ANTECUBITAL  Final   Special Requests   Final    BOTTLES DRAWN AEROBIC ONLY Blood Culture results may not be optimal due to an inadequate volume of blood received in culture bottles   Culture  Setup Time   Final    GRAM POSITIVE COCCI ANAEROBIC BOTTLE ONLY Organism ID to follow CRITICAL RESULT CALLED TO, READ BACK BY AND  VERIFIED WITH: PHARMD S WATSON [id number] AT 1233 BY CM    Culture   Final    GRAM POSITIVE COCCI TOO YOUNG TO READ Performed at [facility], [place] [address] [place], [place] [address]    Report Status PENDING  Incomplete  SARS CORONAVIRUS 2 (TAT 6-24 HRS) Nasopharyngeal Nasopharyngeal Swab     Status: None   Collection Time: 11/02/20  1:24 PM   Specimen: Nasopharyngeal Swab  Result Value Ref Range Status   SARS Coronavirus 2 NEGATIVE NEGATIVE Final    Comment: (NOTE) SARS-CoV-2 target nucleic acids are NOT DETECTED.  The SARS-CoV-2 RNA is generally detectable in upper and lower respiratory specimens during the acute phase of infection. Negative results do not preclude SARS-CoV-2 infection, do not rule out co-infections with other pathogens, and should not be used as the sole basis for treatment or other patient management decisions. Negative results must be combined with clinical observations, patient history, and epidemiological information. The expected result is Negative.  Fact Sheet for Patients: SugarRoll.be  Fact Sheet for Healthcare Providers: https://www.woods-mathews.com/  This test is not yet approved or cleared by the [place] FDA and  has been authorized for detection and/or diagnosis of SARS-CoV-2 by FDA under an Emergency Use Authorization (EUA). This EUA will remain  in effect (meaning this test can be used) for the duration of the COVID-19 declaration under Se ction 564(b)(1) of the Act, 21 U.S.C. section 360bbb-3(b)(1), unless the authorization is terminated or revoked sooner.  Performed at [facility], [place] [address], [place], [place] [address]   Blood Culture ID Panel (Reflexed)     Status: Abnormal   Collection Time: 11/02/20  1:24 PM  Result Value Ref Range Status   Enterococcus faecalis NOT DETECTED NOT DETECTED Final   Enterococcus Faecium NOT DETECTED NOT DETECTED Final   Listeria  monocytogenes NOT DETECTED NOT DETECTED Final   Staphylococcus species NOT DETECTED NOT  DETECTED Final   Staphylococcus aureus (BCID) NOT DETECTED NOT DETECTED Final   Staphylococcus epidermidis NOT DETECTED NOT DETECTED Final   Staphylococcus lugdunensis NOT DETECTED NOT DETECTED Final   Streptococcus species DETECTED (A) NOT DETECTED Final    Comment: Not Enterococcus species, Streptococcus agalactiae, Streptococcus pyogenes, or Streptococcus pneumoniae. CRITICAL RESULT CALLED TO, READ BACK BY AND VERIFIED WITH: PHARMD S WATSON [id number] AT 1233 BY CM    Streptococcus agalactiae NOT DETECTED NOT DETECTED Final   Streptococcus pneumoniae NOT DETECTED NOT DETECTED Final   Streptococcus pyogenes NOT DETECTED NOT DETECTED Final   A.calcoaceticus-baumannii NOT DETECTED NOT DETECTED Final   Bacteroides fragilis NOT DETECTED NOT DETECTED Final   Enterobacterales NOT DETECTED NOT DETECTED Final   Enterobacter cloacae complex NOT DETECTED NOT DETECTED Final   Escherichia coli NOT DETECTED NOT DETECTED Final   Klebsiella aerogenes NOT DETECTED NOT DETECTED Final   Klebsiella oxytoca NOT DETECTED NOT DETECTED Final   Klebsiella pneumoniae NOT DETECTED NOT DETECTED Final   Proteus species NOT DETECTED NOT DETECTED Final   Salmonella species NOT DETECTED NOT DETECTED Final   Serratia marcescens NOT DETECTED NOT DETECTED Final   Haemophilus influenzae NOT DETECTED NOT DETECTED Final   Neisseria meningitidis NOT DETECTED NOT DETECTED Final   Pseudomonas aeruginosa NOT DETECTED NOT DETECTED Final   Stenotrophomonas maltophilia NOT DETECTED NOT DETECTED Final   Candida albicans NOT DETECTED NOT DETECTED Final   Candida auris NOT DETECTED NOT DETECTED Final   Candida glabrata NOT DETECTED NOT DETECTED Final   Candida krusei NOT DETECTED NOT DETECTED Final   Candida parapsilosis NOT DETECTED NOT DETECTED Final   Candida tropicalis NOT DETECTED NOT DETECTED Final   Cryptococcus neoformans/gattii NOT  DETECTED NOT DETECTED Final    Comment: Performed at [facility], [address] [place], [place] [address]  Blood culture (routine x 2)     Status: None (Preliminary result)   Collection Time: 11/02/20  1:26 PM   Specimen: BLOOD  Result Value Ref Range Status   Specimen Description BLOOD RIGHT ANTECUBITAL  Final   Special Requests   Final    BOTTLES DRAWN AEROBIC AND ANAEROBIC Blood Culture results may not be optimal due to an inadequate volume of blood received in culture bottles   Culture   Final    NO GROWTH 3 DAYS Performed at [facility], [address] [place], [place] [address]    Report Status PENDING  Incomplete  Gastrointestinal Panel by PCR , Stool     Status: None   Collection Time: 11/02/20  9:13 PM   Specimen: Stool  Result Value Ref Range Status   Campylobacter species NOT DETECTED NOT DETECTED Final   Plesimonas shigelloides NOT DETECTED NOT DETECTED Final   Salmonella species NOT DETECTED NOT DETECTED Final   Yersinia enterocolitica NOT DETECTED NOT DETECTED Final   Vibrio species NOT DETECTED NOT DETECTED Final   Vibrio cholerae NOT DETECTED NOT DETECTED Final   Enteroaggregative E coli (EAEC) NOT DETECTED NOT DETECTED Final   Enteropathogenic E coli (EPEC) NOT DETECTED NOT DETECTED Final   Enterotoxigenic E coli (ETEC) NOT DETECTED NOT DETECTED Final   Shiga like toxin producing E coli (STEC) NOT DETECTED NOT DETECTED Final   Shigella/Enteroinvasive E coli (EIEC) NOT DETECTED NOT DETECTED Final   Cryptosporidium NOT DETECTED NOT DETECTED Final   Cyclospora cayetanensis NOT DETECTED NOT DETECTED Final   Entamoeba histolytica NOT DETECTED NOT DETECTED Final   Giardia lamblia NOT DETECTED NOT DETECTED Final  Adenovirus F40/41 NOT DETECTED NOT DETECTED Final   Astrovirus NOT DETECTED NOT DETECTED Final   Norovirus GI/GII NOT DETECTED NOT DETECTED Final   Rotavirus A NOT DETECTED NOT DETECTED Final   Sapovirus (I, II, IV, and V) NOT DETECTED  NOT DETECTED Final    Comment: Performed at [facility], [place] [place], [place] [address]  Aerobic/Anaerobic Culture w Gram Stain (surgical/deep wound)     Status: None (Preliminary result)   Collection Time: 11/03/20  4:30 PM   Specimen: Abscess  Result Value Ref Range Status   Specimen Description   Final    ABSCESS Performed at [facility], [address], [place], [place] [address]    Special Requests   Final    NONE Performed at [facility], [address] [place], [place] [address]    Gram Stain   Final    NO ORGANISMS SEEN SQUAMOUS EPITHELIAL CELLS PRESENT ABUNDANT WBC PRESENT,BOTH PMN AND MONONUCLEAR ABUNDANT GRAM POSITIVE COCCI Performed at [facility], [address], [place], [place] [address]    Culture   Final    CULTURE REINCUBATED FOR BETTER GROWTH NO ANAEROBES ISOLATED; CULTURE IN PROGRESS FOR 5 DAYS    Report Status PENDING  Incomplete  MRSA Next Gen by PCR, Nasal     Status: None   Collection Time: 11/03/20  8:22 PM   Specimen: Nasal Mucosa; Nasal Swab  Result Value Ref Range Status   MRSA by PCR Next Gen NOT DETECTED NOT DETECTED Final    Comment: (NOTE) The GeneXpert MRSA Assay (FDA approved for NASAL specimens only), is one component of a comprehensive MRSA colonization surveillance program. It is not intended to diagnose MRSA infection nor to guide or monitor treatment for MRSA infections. Test performance is not FDA approved in patients less than 39 years old. Performed at [facility], [address], [place], [place] [address]          Radiology Studies: [place] Intraoperative  Result Date: 11/03/2020 CLINICAL DATA:  Ultrasound was provided for use by the ordering physician.  No provider Interpretation or professional fees incurred.    DG Chest Port 1 View  Result Date: 11/03/2020 CLINICAL DATA:  Acute respiratory failure. EXAM: PORTABLE CHEST 1 VIEW COMPARISON:  CTA chest and chest  x-ray from yesterday. FINDINGS: The heart size and mediastinal contours are within normal limits. New mild pulmonary vascular congestion. Low lung volumes with mild bibasilar atelectasis. New possible trace right pleural effusion. No pneumothorax. No acute osseous abnormality. IMPRESSION: 1. New mild pulmonary vascular congestion and possible trace right pleural effusion. Electronically Signed   By: Titus Dubin M.D.   On: 11/03/2020 18:46   ECHOCARDIOGRAM COMPLETE  Result Date: 11/04/2020    ECHOCARDIOGRAM REPORT   Patient Name:   NORAA PICKERAL Date of Exam: 11/04/2020 Medical Rec #:  [id number]     Height:       68.0 in Accession #:    [id number]    Weight:       246.3 lb Date of Birth:  09-Jul-1955     BSA:          2.233 m Patient Age:    10 years      BP:           101/74 mmHg Patient Gender: F             HR:           95 bpm. Exam Location:  [facility] Procedure: 2D Echo, Color Doppler and Cardiac Doppler Indications:     I48.91 Atrial fibrillation  History:         Patient has no prior history of Echocardiogram examinations.                  CHF, COPD; Risk Factors:Hypertension, Diabetes and Current                  Smoker.  Sonographer:     Charmayne Sheer Referring Phys:  Worth Diagnosing Phys: Neoma Laming  Sonographer Comments: Suboptimal parasternal window and suboptimal subcostal window. IMPRESSIONS  1. Left ventricular ejection fraction, by estimation, is 25 to 30%. The left ventricle has severely decreased function. The left ventricle demonstrates global hypokinesis. The left ventricular internal cavity size was severely dilated. There is mild concentric left ventricular hypertrophy. Left ventricular diastolic parameters are consistent with Grade I diastolic dysfunction (impaired relaxation).  2. Right ventricular systolic function is moderately reduced. The right ventricular size is mildly enlarged. Mildly increased right ventricular wall thickness.  3. Left atrial size was severely dilated.   4. Right atrial size was severely dilated.  5. The mitral valve is grossly normal. Trivial mitral valve regurgitation.  6. The aortic valve is grossly normal. Aortic valve regurgitation is not visualized. Conclusion(s)/Recommendation(s): Findings consistent with dilated cardiomyopathy. FINDINGS  Left Ventricle: Left ventricular ejection fraction, by estimation, is 25 to 30%. The left ventricle has severely decreased function. The left ventricle demonstrates global hypokinesis. The left ventricular internal cavity size was severely dilated. There is mild concentric left ventricular hypertrophy. Left ventricular diastolic parameters are consistent with Grade I diastolic dysfunction (impaired relaxation). Right Ventricle: The right ventricular size is mildly enlarged. Mildly increased right ventricular wall thickness. Right ventricular systolic function is moderately reduced. Left Atrium: Left atrial size was severely dilated. Right Atrium: Right atrial size was severely dilated. Pericardium: There is no evidence of pericardial effusion. Mitral Valve: The mitral valve is grossly normal. Trivial mitral valve regurgitation. MV peak gradient, 3.6 mmHg. The mean mitral valve gradient is 2.5 mmHg. Tricuspid Valve: The tricuspid valve is grossly normal. Tricuspid valve regurgitation is trivial. Aortic Valve: The aortic valve is grossly normal. Aortic valve regurgitation is not visualized. Aortic valve mean gradient measures 3.0 mmHg. Aortic valve peak gradient measures 7.2 mmHg. Aortic valve area, by VTI measures 3.76 cm. Pulmonic Valve: The pulmonic valve was grossly normal. Pulmonic valve regurgitation is trivial. Aorta: The aortic root, ascending aorta and aortic arch are all structurally normal, with no evidence of dilitation or obstruction. IAS/Shunts: No atrial level shunt detected by color flow Doppler. There is no evidence of a patent foramen ovale. No ventricular septal defect is seen or detected. There is no  evidence of an atrial septal defect.  LEFT VENTRICLE PLAX 2D LVIDd:         4.69 cm  Diastology LVIDs:         4.10 cm  LV e' medial:    5.98 cm/s LV PW:         1.11 cm  LV E/e' medial:  13.3 LV IVS:        0.92 cm  LV e' lateral:   7.72 cm/s LVOT diam:     2.50 cm  LV E/e' lateral: 10.3 LV SV:         106 LV SV Index:   47 LVOT Area:     4.91 cm  RIGHT VENTRICLE RV Basal diam:  3.34 cm  RV S prime:     8.38 cm/s LEFT ATRIUM             Index       RIGHT ATRIUM           Index LA diam:        3.90 cm 1.75 cm/m  RA Area:     12.00 cm LA Vol (A2C):   60.8 ml 27.23 ml/m RA Volume:   25.70 ml  11.51 ml/m LA Vol (A4C):   31.5 ml 14.11 ml/m LA Biplane Vol: 45.0 ml 20.16 ml/m  AORTIC VALVE                   PULMONIC VALVE AV Area (Vmax):    3.92 cm    PV Vmax:          0.93 m/s AV Area (Vmean):   4.46 cm    PV Vmean:         64.200 cm/s AV Area (VTI):     3.76 cm    PV VTI:           0.185 m AV Vmax:           134.00 cm/s PV Peak grad:     3.5 mmHg AV Vmean:          83.000 cm/s PV Mean grad:     2.0 mmHg AV VTI:            0.282 m     PR End Diast Vel: 7.51 msec AV Peak Grad:      7.2 mmHg AV Mean Grad:      3.0 mmHg LVOT Vmax:         107.00 cm/s LVOT Vmean:        75.400 cm/s LVOT VTI:          0.216 m LVOT/AV VTI ratio: 0.77  AORTA Ao Root diam: 2.90 cm MITRAL VALVE MV Area (PHT): 3.63 cm    SHUNTS MV Area VTI:   4.76 cm    Systemic VTI:  0.22 m MV Peak grad:  3.6 mmHg    Systemic Diam: 2.50 cm MV Mean grad:  2.5 mmHg MV Vmax:       0.94 m/s MV Vmean:      65.4 cm/s MV Decel Time: 209 msec MV E velocity: 79.70 cm/s MV A velocity: 90.80 cm/s MV E/A ratio:  0.88 Shaukat Khan Electronically signed by Neoma Laming Signature Date/Time: 11/04/2020/1:34:20 PM    Final    CT IMAGE GUIDED DRAINAGE BY PERCUTANEOUS CATHETER  Result Date: 11/04/2020 INDICATION: Concern for hepatic abscess. Please perform CT-guided aspiration versus drainage catheter placement for infection source control purposes. EXAM: CT-GUIDED  HEPATIC ABSCESS DRAINAGE CATHETER PLACEMENT X2 COMPARISON:  CT abdomen and pelvis-11/02/2020 MEDICATIONS: The patient is currently admitted to the hospital and receiving intravenous antibiotics. The antibiotics were administered within an appropriate time frame prior to the initiation of the procedure. ANESTHESIA/SEDATION: Moderate (conscious) sedation was employed during this procedure. A total of Versed 2 mg and Fentanyl 100 mcg was administered intravenously. Moderate Sedation Time: 27 minutes. The patient's level of consciousness and vital signs were monitored continuously by radiology nursing throughout the procedure under my direct supervision. CONTRAST:  None COMPLICATIONS: SIR LEVEL B - Normal therapy, includes overnight admission for observation. Patient developed postprocedural rigors requiring administration of IV demerol. PROCEDURE: Informed written consent was obtained from the patient after a discussion of the risks, benefits and alternatives to treatment. The patient  was placed supine, slightly LPO on the CT gantry and a pre procedural CT was performed re-demonstrating the known abscess/fluid collections within the right lobe of the liver with dominant collection within the more cranial aspect of the right lobe of the liver measuring approximately 8.4 x 5.8 cm (image 21, series 2) and additional collection within the more caudal aspect of the right lobe of the liver measuring approximately 9.6 x 7.1 cm (image 34, series 2) however both collections were suboptimally visualized on acquired noncontrast images. Both collections were identified sonographically however again, both collections were suboptimally visualized secondary to patient body habitus and extensive hepatic steatosis. The procedure was planned. A timeout was performed prior to the initiation of the procedure. The skin overlying the right upper abdomen was prepped and draped in the usual sterile fashion. The overlying soft tissues were  anesthetized with 1% lidocaine with epinephrine. Under direct ultrasound guidance, the dominant collection more caudal aspect of the right lobe of the liver was targeted with an 18 gauge trocar needle. Multiple ultrasound images were saved procedural documentation purposes. A small amount of purulent fluid was aspirated and a short Amplatz wire was coiled within the collection. Appropriate positioning was confirmed with CT imaging. Next, the track was dilated ultimately allowing placement of a 12 [place] all-purpose drainage catheter with end coiled and locked within the dominant hepatic abscess. Approximately 120 cc of bloody purulent fluid was aspirated from the collection. Postprocedural imaging demonstrates appropriate positioning of the initial drainage catheter with unchanged appearance of the additional collection within the more cranial aspect the right lobe of the liver. As such, decision was made to proceed with image guided placement of an additional hepatic abscess drainage catheter. Under direct ultrasound guidance, the remaining collection within more caudal aspect the right lobe of the liver was targeted with an 18 gauge trocar needle. Multiple ultrasound images were saved procedural documentation purposes. A small amount of purulent fluid was aspirated and a short Amplatz wire was coiled within the collection. Appropriate positioning was confirmed with CT imaging. The track was dilated, ultimately allowing placement of a 12 [place] all-purpose drainage catheter with end coiled and locked within the remaining hepatic abscess. Approximately 80 cc of bloody purulent fluid was aspirated from the collection. Postprocedural imaging was obtained demonstrating appropriate positioning of both percutaneous drainage catheters Both drainage catheters were flushed with a small amount of saline and connected to gravity bags. Both drainage catheters were secured at the skin entrance site within interrupted sutures  and StatLock devices. Dressings were applied. Patient developed rigors following placement of the drainage catheters which was treated with IV demerol upon her arrival back to the emergency department. The patient otherwise tolerated the procedure well without immediate postprocedural complication and remained hemodynamically stable throughout the procedure. IMPRESSION: 1. Successful ultrasound and CT-guided placement of 12 [place] all-purpose drainage catheters into both dominant hepatic abscesses within the right lobe of the liver yielding a total of 200 cc of bloody purulent fluid. A representative sample of aspirated fluid was capped and sent to the laboratory for analysis. 2. While the patient remained hemodynamically stable throughout the procedure, she developed postprocedural rigors which was subsequently treated with IV Demerol upon her arrival back to the emergency department. Electronically Signed   By: Sandi Mariscal M.D.   On: 11/04/2020 08:41   CT IMAGE GUIDED DRAINAGE BY PERCUTANEOUS CATHETER  Result Date: 11/04/2020 INDICATION: Concern for hepatic abscess. Please perform CT-guided aspiration versus drainage catheter placement for infection source control purposes.  EXAM: CT-GUIDED HEPATIC ABSCESS DRAINAGE CATHETER PLACEMENT X2 COMPARISON:  CT abdomen and pelvis-11/02/2020 MEDICATIONS: The patient is currently admitted to the hospital and receiving intravenous antibiotics. The antibiotics were administered within an appropriate time frame prior to the initiation of the procedure. ANESTHESIA/SEDATION: Moderate (conscious) sedation was employed during this procedure. A total of Versed 2 mg and Fentanyl 100 mcg was administered intravenously. Moderate Sedation Time: 27 minutes. The patient's level of consciousness and vital signs were monitored continuously by radiology nursing throughout the procedure under my direct supervision. CONTRAST:  None COMPLICATIONS: SIR LEVEL B - Normal therapy, includes  overnight admission for observation. Patient developed postprocedural rigors requiring administration of IV demerol. PROCEDURE: Informed written consent was obtained from the patient after a discussion of the risks, benefits and alternatives to treatment. The patient was placed supine, slightly LPO on the CT gantry and a pre procedural CT was performed re-demonstrating the known abscess/fluid collections within the right lobe of the liver with dominant collection within the more cranial aspect of the right lobe of the liver measuring approximately 8.4 x 5.8 cm (image 21, series 2) and additional collection within the more caudal aspect of the right lobe of the liver measuring approximately 9.6 x 7.1 cm (image 34, series 2) however both collections were suboptimally visualized on acquired noncontrast images. Both collections were identified sonographically however again, both collections were suboptimally visualized secondary to patient body habitus and extensive hepatic steatosis. The procedure was planned. A timeout was performed prior to the initiation of the procedure. The skin overlying the right upper abdomen was prepped and draped in the usual sterile fashion. The overlying soft tissues were anesthetized with 1% lidocaine with epinephrine. Under direct ultrasound guidance, the dominant collection more caudal aspect of the right lobe of the liver was targeted with an 18 gauge trocar needle. Multiple ultrasound images were saved procedural documentation purposes. A small amount of purulent fluid was aspirated and a short Amplatz wire was coiled within the collection. Appropriate positioning was confirmed with CT imaging. Next, the track was dilated ultimately allowing placement of a 12 [place] all-purpose drainage catheter with end coiled and locked within the dominant hepatic abscess. Approximately 120 cc of bloody purulent fluid was aspirated from the collection. Postprocedural imaging demonstrates appropriate  positioning of the initial drainage catheter with unchanged appearance of the additional collection within the more cranial aspect the right lobe of the liver. As such, decision was made to proceed with image guided placement of an additional hepatic abscess drainage catheter. Under direct ultrasound guidance, the remaining collection within more caudal aspect the right lobe of the liver was targeted with an 18 gauge trocar needle. Multiple ultrasound images were saved procedural documentation purposes. A small amount of purulent fluid was aspirated and a short Amplatz wire was coiled within the collection. Appropriate positioning was confirmed with CT imaging. The track was dilated, ultimately allowing placement of a 12 [place] all-purpose drainage catheter with end coiled and locked within the remaining hepatic abscess. Approximately 80 cc of bloody purulent fluid was aspirated from the collection. Postprocedural imaging was obtained demonstrating appropriate positioning of both percutaneous drainage catheters Both drainage catheters were flushed with a small amount of saline and connected to gravity bags. Both drainage catheters were secured at the skin entrance site within interrupted sutures and StatLock devices. Dressings were applied. Patient developed rigors following placement of the drainage catheters which was treated with IV demerol upon her arrival back to the emergency department. The patient otherwise tolerated the  procedure well without immediate postprocedural complication and remained hemodynamically stable throughout the procedure. IMPRESSION: 1. Successful ultrasound and CT-guided placement of 12 French all-purpose drainage catheters into both dominant hepatic abscesses within the right lobe of the liver yielding a total of 200 cc of bloody purulent fluid. A representative sample of aspirated fluid was capped and sent to the laboratory for analysis. 2. While the patient remained hemodynamically  stable throughout the procedure, she developed postprocedural rigors which was subsequently treated with IV Demerol upon her arrival back to the emergency department. Electronically Signed   By: Sandi Mariscal M.D.   On: 11/04/2020 08:41        Scheduled Meds:  amiodarone  200 mg Oral Daily   Chlorhexidine Gluconate Cloth  6 each Topical Q0600   dextromethorphan-guaiFENesin  1 tablet Oral BID   insulin aspart  0-15 Units Subcutaneous Q4H   sodium chloride flush  5 mL Intracatheter Q8H   Continuous Infusions:  sodium chloride Stopped (11/04/20 0534)   lactated ringers 50 mL/hr at 11/05/20 0616   piperacillin-tazobactam (ZOSYN)  IV 12.5 mL/hr at 11/05/20 0616     LOS: 3 days    Time spent: 32 minutes    [name], MD [facility]   To contact the attending provider between 7A-7P or the covering provider during after hours 7P-7A, please log into the web site [email or web address] and access using universal [place] password for that web site. If you do not have the password, please call the hospital operator.  11/05/2020, 12:32 PM

## 2020-11-05 NOTE — Progress Notes (Signed)
Date of Admission:  11/02/2020     ID: Stacy Cox is a 65 y.o. female  Principal Problem:   Sepsis secondary to acute diverticulitis Active Problems:   Polycythemia vera ([place])   Acute diverticulitis   Tobacco abuse   Type 2 diabetes mellitus    Liver abscess   COPD (chronic obstructive pulmonary disease) (HCC)   CHF (congestive heart failure) (HCC)   Drop in hemoglobin   AF (paroxysmal atrial fibrillation) (HCC)    Subjective: Patient was sleeping when I went to see her.  Spoke to her nurse. Nurse says she has been feeling better Waiting to be transferred out of the stepdown down   Medications:   amiodarone  200 mg Oral Daily   Chlorhexidine Gluconate Cloth  6 each Topical Q0600   dextromethorphan-guaiFENesin  1 tablet Oral BID   insulin aspart  0-15 Units Subcutaneous Q4H   potassium chloride  40 mEq Oral BID   sodium chloride flush  5 mL Intracatheter Q8H    Objective: Vital signs in last 24 hours: Patient Vitals for the past 24 hrs:  BP Temp Temp src Pulse Resp SpO2  11/05/20 1800 130/63 99.8 F (37.7 C) -- 84 (!) 35 92 %  11/05/20 1600 114/74 -- -- 91 (!) 31 94 %  11/05/20 1400 128/76 -- -- 76 (!) 32 95 %  11/05/20 1000 133/72 98.9 F (37.2 C) -- 76 (!) 22 95 %  11/05/20 0805 113/60 -- -- 66 (!) 30 94 %  11/05/20 0700 115/60 98.7 F (37.1 C) -- 81 20 97 %  11/05/20 0600 107/69 -- -- 70 (!) 30 96 %  11/05/20 0500 122/69 -- -- 74 (!) 32 94 %  11/05/20 0400 (!) 139/112 99.1 F (37.3 C) Oral 60 (!) 23 97 %  11/05/20 0300 115/68 -- -- 76 (!) 27 96 %  11/05/20 0200 116/80 -- -- 82 16 96 %  11/05/20 0100 114/82 -- -- 69 (!) 24 94 %  11/05/20 0000 122/70 99.5 F (37.5 C) Oral 85 (!) 25 92 %  11/04/20 2300 (!) 86/71 -- -- 93 (!) 26 (!) 88 %  11/04/20 2200 119/87 -- -- 73 (!) 29 94 %  11/04/20 2100 (!) 126/101 -- -- 68 (!) 32 (!) 89 %  11/04/20 2000 132/61 99.6 F (37.6 C) Oral 69 (!) 32 90 %    PHYSICAL EXAM:  General: Sleeping  Lungs: Bilateral air  entry.Marland Kitchen Heart: s1s2 Abdomen: Soft, 2 upper quadrant drains present Bowel sounds present  Extremities: atraumatic, no cyanosis. No edema. No clubbing Skin: No rashes or lesions. Or bruising  Neurologic: Not examined l  Lab Results Recent Labs    11/04/20 0614 11/04/20 1425 11/05/20 0413  WBC 26.9*  --  18.7*  HGB 12.6 13.2 13.2  HCT 34.5*  --  36.5  NA 134*  --  137  K 3.4*  --  3.4*  CL 101  --  102  CO2 26  --  27  BUN 18  --  16  CREATININE 0.80  --  0.61   Liver Panel Recent Labs    11/03/20 0619 11/04/20 0614  PROT 7.1 5.3*  ALBUMIN 2.6* 2.1*  AST 59* 66*  ALT 65* 61*  ALKPHOS 131* 106  BILITOT 3.2* 3.3*   Sedimentation Rate No results for input(s): ESRSEDRATE in the last 72 hours. C-Reactive Protein No results for input(s): CRP in the last 72 hours.  Microbiology:  Studies/Results: ECHOCARDIOGRAM COMPLETE  Result Date: 11/04/2020  ECHOCARDIOGRAM REPORT   Patient Name:   Stacy Cox Date of Exam: 11/04/2020 Medical Rec #:  [id number]     Height:       68.0 in Accession #:    [id number]    Weight:       246.3 lb Date of Birth:  1955/04/28     BSA:          2.233 m Patient Age:    69 years      BP:           101/74 mmHg Patient Gender: F             HR:           95 bpm. Exam Location:  [facility] Procedure: 2D Echo, Color Doppler and Cardiac Doppler Indications:     I48.91 Atrial fibrillation  History:         Patient has no prior history of Echocardiogram examinations.                  CHF, COPD; Risk Factors:Hypertension, Diabetes and Current                  Smoker.  Sonographer:     Charmayne Sheer Referring Phys:  [place] Diagnosing Phys: Neoma Laming  Sonographer Comments: Suboptimal parasternal window and suboptimal subcostal window. IMPRESSIONS  1. Left ventricular ejection fraction, by estimation, is 25 to 30%. The left ventricle has severely decreased function. The left ventricle demonstrates global hypokinesis. The left ventricular internal cavity size  was severely dilated. There is mild concentric left ventricular hypertrophy. Left ventricular diastolic parameters are consistent with Grade I diastolic dysfunction (impaired relaxation).  2. Right ventricular systolic function is moderately reduced. The right ventricular size is mildly enlarged. Mildly increased right ventricular wall thickness.  3. Left atrial size was severely dilated.  4. Right atrial size was severely dilated.  5. The mitral valve is grossly normal. Trivial mitral valve regurgitation.  6. The aortic valve is grossly normal. Aortic valve regurgitation is not visualized. Conclusion(s)/Recommendation(s): Findings consistent with dilated cardiomyopathy. FINDINGS  Left Ventricle: Left ventricular ejection fraction, by estimation, is 25 to 30%. The left ventricle has severely decreased function. The left ventricle demonstrates global hypokinesis. The left ventricular internal cavity size was severely dilated. There is mild concentric left ventricular hypertrophy. Left ventricular diastolic parameters are consistent with Grade I diastolic dysfunction (impaired relaxation). Right Ventricle: The right ventricular size is mildly enlarged. Mildly increased right ventricular wall thickness. Right ventricular systolic function is moderately reduced. Left Atrium: Left atrial size was severely dilated. Right Atrium: Right atrial size was severely dilated. Pericardium: There is no evidence of pericardial effusion. Mitral Valve: The mitral valve is grossly normal. Trivial mitral valve regurgitation. MV peak gradient, 3.6 mmHg. The mean mitral valve gradient is 2.5 mmHg. Tricuspid Valve: The tricuspid valve is grossly normal. Tricuspid valve regurgitation is trivial. Aortic Valve: The aortic valve is grossly normal. Aortic valve regurgitation is not visualized. Aortic valve mean gradient measures 3.0 mmHg. Aortic valve peak gradient measures 7.2 mmHg. Aortic valve area, by VTI measures 3.76 cm. Pulmonic Valve:  The pulmonic valve was grossly normal. Pulmonic valve regurgitation is trivial. Aorta: The aortic root, ascending aorta and aortic arch are all structurally normal, with no evidence of dilitation or obstruction. IAS/Shunts: No atrial level shunt detected by color flow Doppler. There is no evidence of a patent foramen ovale. No ventricular septal defect is seen or detected. There is no evidence  of an atrial septal defect.  LEFT VENTRICLE PLAX 2D LVIDd:         4.69 cm  Diastology LVIDs:         4.10 cm  LV e' medial:    5.98 cm/s LV PW:         1.11 cm  LV E/e' medial:  13.3 LV IVS:        0.92 cm  LV e' lateral:   7.72 cm/s LVOT diam:     2.50 cm  LV E/e' lateral: 10.3 LV SV:         106 LV SV Index:   47 LVOT Area:     4.91 cm  RIGHT VENTRICLE RV Basal diam:  3.34 cm RV S prime:     8.38 cm/s LEFT ATRIUM             Index       RIGHT ATRIUM           Index LA diam:        3.90 cm 1.75 cm/m  RA Area:     12.00 cm LA Vol (A2C):   60.8 ml 27.23 ml/m RA Volume:   25.70 ml  11.51 ml/m LA Vol (A4C):   31.5 ml 14.11 ml/m LA Biplane Vol: 45.0 ml 20.16 ml/m  AORTIC VALVE                   PULMONIC VALVE AV Area (Vmax):    3.92 cm    PV Vmax:          0.93 m/s AV Area (Vmean):   4.46 cm    PV Vmean:         64.200 cm/s AV Area (VTI):     3.76 cm    PV VTI:           0.185 m AV Vmax:           134.00 cm/s PV Peak grad:     3.5 mmHg AV Vmean:          83.000 cm/s PV Mean grad:     2.0 mmHg AV VTI:            0.282 m     PR End Diast Vel: 7.51 msec AV Peak Grad:      7.2 mmHg AV Mean Grad:      3.0 mmHg LVOT Vmax:         107.00 cm/s LVOT Vmean:        75.400 cm/s LVOT VTI:          0.216 m LVOT/AV VTI ratio: 0.77  AORTA Ao Root diam: 2.90 cm MITRAL VALVE MV Area (PHT): 3.63 cm    SHUNTS MV Area VTI:   4.76 cm    Systemic VTI:  0.22 m MV Peak grad:  3.6 mmHg    Systemic Diam: 2.50 cm MV Mean grad:  2.5 mmHg MV Vmax:       0.94 m/s MV Vmean:      65.4 cm/s MV Decel Time: 209 msec MV E velocity: 79.70 cm/s MV A  velocity: 90.80 cm/s MV E/A ratio:  0.88 Shaukat Khan Electronically signed by Neoma Laming Signature Date/Time: 11/04/2020/1:34:20 PM    Final      Assessment/Plan:  Sepsis secondary to liver abscess.  Streptococcus intermedius in culture so far. Status post drainage Streptococcus bacteremia secondary to perforated diverticulitis and liver abscess. Patient is currently on Zosyn.  Will change to ampicillin sulbactam.  Episode of atrial fibrillation has resolved.  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

## 2020-11-06 ENCOUNTER — Inpatient Hospital Stay: Payer: Self-pay

## 2020-11-06 DIAGNOSIS — R7881 Bacteremia: Secondary | ICD-10-CM | POA: Diagnosis not present

## 2020-11-06 DIAGNOSIS — B955 Unspecified streptococcus as the cause of diseases classified elsewhere: Secondary | ICD-10-CM

## 2020-11-06 DIAGNOSIS — K750 Abscess of liver: Secondary | ICD-10-CM | POA: Diagnosis not present

## 2020-11-06 DIAGNOSIS — K5792 Diverticulitis of intestine, part unspecified, without perforation or abscess without bleeding: Secondary | ICD-10-CM | POA: Diagnosis not present

## 2020-11-06 DIAGNOSIS — I5022 Chronic systolic (congestive) heart failure: Secondary | ICD-10-CM

## 2020-11-06 DIAGNOSIS — A419 Sepsis, unspecified organism: Secondary | ICD-10-CM | POA: Diagnosis not present

## 2020-11-06 LAB — CBC WITH DIFFERENTIAL/PLATELET
Abs Immature Granulocytes: 0.45 10*3/uL — ABNORMAL HIGH (ref 0.00–0.07)
Basophils Absolute: 0.1 10*3/uL (ref 0.0–0.1)
Basophils Relative: 1 %
Eosinophils Absolute: 0 10*3/uL (ref 0.0–0.5)
Eosinophils Relative: 0 %
HCT: 37.2 % (ref 36.0–46.0)
Hemoglobin: 13.6 g/dL (ref 12.0–15.0)
Immature Granulocytes: 3 %
Lymphocytes Relative: 18 %
Lymphs Abs: 3 10*3/uL (ref 0.7–4.0)
MCH: 32.7 pg (ref 26.0–34.0)
MCHC: 36.6 g/dL — ABNORMAL HIGH (ref 30.0–36.0)
MCV: 89.4 fL (ref 80.0–100.0)
Monocytes Absolute: 1.7 10*3/uL — ABNORMAL HIGH (ref 0.1–1.0)
Monocytes Relative: 10 %
Neutro Abs: 11.1 10*3/uL — ABNORMAL HIGH (ref 1.7–7.7)
Neutrophils Relative %: 68 %
Platelets: 226 10*3/uL (ref 150–400)
RBC: 4.16 MIL/uL (ref 3.87–5.11)
RDW: 14.5 % (ref 11.5–15.5)
WBC: 16.3 10*3/uL — ABNORMAL HIGH (ref 4.0–10.5)
nRBC: 0 % (ref 0.0–0.2)

## 2020-11-06 LAB — BASIC METABOLIC PANEL
Anion gap: 8 (ref 5–15)
BUN: 11 mg/dL (ref 8–23)
CO2: 25 mmol/L (ref 22–32)
Calcium: 8.3 mg/dL — ABNORMAL LOW (ref 8.9–10.3)
Chloride: 104 mmol/L (ref 98–111)
Creatinine, Ser: 0.73 mg/dL (ref 0.44–1.00)
GFR, Estimated: >60 mL/min (ref >60)
Glucose, Bld: 103 mg/dL — ABNORMAL HIGH (ref 70–99)
Potassium: 4.2 mmol/L (ref 3.5–5.1)
Sodium: 137 mmol/L (ref 135–145)

## 2020-11-06 LAB — PHOSPHORUS: Phosphorus: 2.4 mg/dL — ABNORMAL LOW (ref 2.5–4.6)

## 2020-11-06 LAB — GLUCOSE, CAPILLARY
Glucose-Capillary: 87 mg/dL (ref 70–99)
Glucose-Capillary: 109 mg/dL — ABNORMAL HIGH (ref 70–99)
Glucose-Capillary: 105 mg/dL — ABNORMAL HIGH (ref 70–99)
Glucose-Capillary: 112 mg/dL — ABNORMAL HIGH (ref 70–99)
Glucose-Capillary: 152 mg/dL — ABNORMAL HIGH (ref 70–99)
Glucose-Capillary: 112 mg/dL — ABNORMAL HIGH (ref 70–99)

## 2020-11-06 LAB — MAGNESIUM: Magnesium: 1.9 mg/dL (ref 1.7–2.4)

## 2020-11-06 MED ORDER — POTASSIUM & SODIUM PHOSPHATES 280-160-250 MG PO PACK
1.0000 | PACK | Freq: Three times a day (TID) | ORAL | Status: AC
  Administered 2020-11-06 – 2020-11-07 (×4): 1 via ORAL
  Filled 2020-11-06 (×4): qty 1

## 2020-11-06 MED ORDER — SACUBITRIL-VALSARTAN 24-26 MG PO TABS
1.0000 | ORAL_TABLET | Freq: Two times a day (BID) | ORAL | Status: DC
  Administered 2020-11-06 – 2020-11-07 (×3): 1 via ORAL
  Filled 2020-11-06 (×4): qty 1

## 2020-11-06 MED ORDER — APIXABAN 5 MG PO TABS
5.0000 mg | ORAL_TABLET | Freq: Two times a day (BID) | ORAL | Status: DC
  Administered 2020-11-06 – 2020-11-07 (×2): 5 mg via ORAL
  Filled 2020-11-06 (×2): qty 1

## 2020-11-06 NOTE — Progress Notes (Signed)
PROGRESS NOTE    Stacy Cox  ZOX:[id number] DOB: 1955-03-04 DOA: 11/02/2020 PCP: Donnie Coffin, MD    Brief Narrative:  65 year old female with Past medical history of COPD, CHF, type 2 diabetes mellitus, GERD, hypertension, polycythemia vera.  She was admitted to the hospital on 11/02/2020 with clinical sepsis acute diverticulitis and liver abscesses.  Interventional radiology was able to place a drain in the abscess on 11/03/2020.  Strep species growing out of 1 blood culture.  Abscess culture has gram-positive cocci.  After the patient is drained procedure yesterday patient was tachycardic up into the 150s and had altered mental status and tachypnea and was transferred to the ICU to stepdown.   Assessment & Plan:   Principal Problem:   Sepsis secondary to acute diverticulitis Active Problems:   Polycythemia vera (HCC)   Acute diverticulitis   Tobacco abuse   Type 2 diabetes mellitus    Liver abscess   COPD (chronic obstructive pulmonary disease) (HCC)   CHF (congestive heart failure) (HCC)   Drop in hemoglobin   AF (paroxysmal atrial fibrillation) ([place])  Sepsis secondary to liver abscess. Liver abscess secondary to acute diverticulitis due to Streptococcus intermedius. Acute diverticulitis. Streptococcus intermedius septicemia. Patient condition is improving.  Planning discharge home tomorrow.   Patient will need Rocephin and oral Flagyl for 4 weeks per ID.  Chronic systolic congestive heart failure. Essential hypertension  Relative hypotension due to sepsis at time admission. Condition is stable.  No exacerbation. Cardiology has been seeing patient, started Entresto.  We will continue.  Brief episode of paroxysmal atrial fibrillation. Followed by cardiology, okay to restart Eliquis.  Type 2 diabetes with diabetic neuropathy. Continue to follow.  Hypokalemia Hypophosphatemia Hypomagnesemia. Repleted phosphorus orally.  Obstructive sleep  apnea. Hypoxemia. Patient developed hypoxemia.  To be secondary to obstruct sleep apnea.  Patient was using CPAP at home, not started in the hospital.  Spoke with respiratory therapy, will restart CPAP tonight.  DVT prophylaxis: SCDs Code Status: Full Family Communication:  Disposition Plan:      Status is: Inpatient   Remains inpatient appropriate because:IV treatments appropriate due to intensity of illness or inability to take PO and Inpatient level of care appropriate due to severity of illness   Dispo: The patient is from: Home              Anticipated d/c is to: Home              Patient currently is not medically stable to d/c.              Difficult to place patient No         I/O last 3 completed shifts: In: 669.4 [I.V.:563.4; IV [id number]:956] Out: 2130 [Urine:3000; Drains:120] No intake/output data recorded.    Consultants:  ID   Procedures:    Antimicrobials: Unasyn    Subjective: Patient still requiring 2 L oxygen, it happened that the patient was on CPAP at home, did not restart in the hospital.  She currently does not feel short of breath or cough. She has some pain in the stomach at the site of the drain.  No nausea vomiting. No fever or chills. No dysuria hematuria.  Objective: Vitals:   11/06/20 0700 11/06/20 0800 11/06/20 0853 11/06/20 1258  BP:   119/62 126/70  Pulse: 91  79 82  Resp: (!) 28  (!) 26 20  Temp:  98.7 F (37.1 C)    TempSrc:      SpO2: 93%  95% 95%  Weight:      Height:        Intake/Output Summary (Last 24 hours) at 11/06/2020 1358 Last data filed at 11/06/2020 0400 Gross per 24 hour  Intake 54.86 ml  Output 1770 ml  Net -1715.14 ml   Filed Weights   11/03/20 2015  Weight: 111.7 kg    Examination:  General exam: Appears calm and comfortable  Respiratory system: Clear to auscultation. Respiratory effort normal. Cardiovascular system: S1 & S2 heard, RRR. No JVD, murmurs, rubs, gallops or clicks. No pedal  edema. Gastrointestinal system: Abdomen is nondistended, soft and nontender. No organomegaly or masses felt. Normal bowel sounds heard. Central nervous system: Alert and oriented. No focal neurological deficits. Extremities: Symmetric 5 x 5 power. Skin: No rashes, lesions or ulcers Psychiatry: Judgement and insight appear normal. Mood & affect appropriate.     Data Reviewed: I have personally reviewed following labs and imaging studies  CBC: Recent Labs  Lab 11/02/20 2141 11/03/20 0619 11/03/20 1804 11/04/20 0614 11/04/20 1425 11/05/20 0413 11/06/20 0420  WBC 21.9* 23.6* 13.0* 26.9*  --  18.7* 16.3*  NEUTROABS 16.8*  --  12.0* 22.4*  --   --  11.1*  HGB 15.0 14.9 15.1* 12.6 13.2 13.2 13.6  HCT 41.3 42.3 41.2 34.5*  --  36.5 37.2  MCV 88.4 90.2 89.2 88.2  --  88.4 89.4  PLT 196 206 189 167  --  200 333   Basic Metabolic Panel: Recent Labs  Lab 11/02/20 1219 11/03/20 0619 11/03/20 1816 11/03/20 2224 11/04/20 0614 11/05/20 0413 11/06/20 0420  NA 136 137  --   --  134* 137 137  K 3.4* 3.2* 3.6  --  3.4* 3.4* 4.2  CL 103 102  --   --  101 102 104  CO2 21* 24  --   --  26 27 25   GLUCOSE 222* 111*  --   --  152* 89 103*  BUN 20 17  --   --  18 16 11   CREATININE 1.51* 0.94  --   --  0.80 0.61 0.73  CALCIUM 9.2 8.4*  --   --  7.7* 8.0* 8.3*  MG  --  1.7 1.6*  --  2.4  --  1.9  PHOS  --   --  1.3* 1.6* 4.4  --  2.4*   GFR: Estimated Creatinine Clearance: 95.5 mL/min (by C-G formula based on SCr of 0.73 mg/dL). Liver Function Tests: Recent Labs  Lab 11/02/20 1219 11/03/20 0619 11/04/20 0614  AST 64* 59* 66*  ALT 77* 65* 61*  ALKPHOS 135* 131* 106  BILITOT 2.9* 3.2* 3.3*  PROT 8.0 7.1 5.3*  ALBUMIN 3.1* 2.6* 2.1*   Recent Labs  Lab 11/02/20 1219  LIPASE 17   No results for input(s): AMMONIA in the last 168 hours. Coagulation Profile: Recent Labs  Lab 11/03/20 0619  INR 1.3*   Cardiac Enzymes: No results for input(s): CKTOTAL, CKMB, CKMBINDEX, TROPONINI  in the last 168 hours. BNP (last 3 results) No results for input(s): PROBNP in the last 8760 hours. HbA1C: No results for input(s): HGBA1C in the last 72 hours. CBG: Recent Labs  Lab 11/05/20 1913 11/05/20 2325 11/06/20 0319 11/06/20 0735 11/06/20 1133  GLUCAP 126* 91 87 109* 105*   Lipid Profile: No results for input(s): CHOL, HDL, LDLCALC, TRIG, CHOLHDL, LDLDIRECT in the last 72 hours. Thyroid Function Tests: No results for input(s): TSH, T4TOTAL, FREET4, T3FREE, THYROIDAB in the last 72 hours. Anemia  Panel: No results for input(s): VITAMINB12, FOLATE, FERRITIN, TIBC, IRON, RETICCTPCT in the last 72 hours. Sepsis Labs: Recent Labs  Lab 11/02/20 1323 11/02/20 2127 11/03/20 0619 11/03/20 2224 11/04/20 0614 11/05/20 0413  PROCALCITON  --   --  38.72  --  75.94 50.17  LATICACIDVEN 3.2* 2.2*  --  2.3* 1.5  --     Recent Results (from the past 240 hour(s))  Urine Culture     Status: None   Collection Time: 11/02/20  1:23 PM   Specimen: Urine, Clean Catch  Result Value Ref Range Status   Specimen Description   Final    URINE, CLEAN CATCH Performed at [facility], [address] [place], [place] [address]    Special Requests   Final    NONE Performed at [facility], [address] [place], [place] [address]    Culture   Final    NO GROWTH Performed at [facility], [place] [address], [place], [place] [address]    Report Status 11/03/2020 FINAL  Final  Blood culture (routine x 2)     Status: Abnormal (Preliminary result)   Collection Time: 11/02/20  1:24 PM   Specimen: BLOOD  Result Value Ref Range Status   Specimen Description BLOOD LEFT ANTECUBITAL  Final   Special Requests   Final    BOTTLES DRAWN AEROBIC ONLY Blood Culture results may not be optimal due to an inadequate volume of blood received in culture bottles   Culture  Setup Time   Final    GRAM POSITIVE COCCI ANAEROBIC BOTTLE ONLY Organism ID to follow CRITICAL RESULT CALLED  TO, READ BACK BY AND VERIFIED WITH: PHARMD S WATSON [id number] AT 1233 BY CM    Culture (A)  Final    STREPTOCOCCUS INTERMEDIUS THE SIGNIFICANCE OF ISOLATING THIS ORGANISM FROM A SINGLE SET OF BLOOD CULTURES WHEN MULTIPLE SETS ARE DRAWN IS UNCERTAIN. PLEASE NOTIFY THE MICROBIOLOGY DEPARTMENT WITHIN ONE WEEK IF SPECIATION AND SENSITIVITIES ARE REQUIRED. Performed at [facility], [place] [address] [place], [place] [address]    Report Status PENDING  Incomplete  SARS CORONAVIRUS 2 (TAT 6-24 HRS) Nasopharyngeal Nasopharyngeal Swab     Status: None   Collection Time: 11/02/20  1:24 PM   Specimen: Nasopharyngeal Swab  Result Value Ref Range Status   SARS Coronavirus 2 NEGATIVE NEGATIVE Final    Comment: (NOTE) SARS-CoV-2 target nucleic acids are NOT DETECTED.  The SARS-CoV-2 RNA is generally detectable in upper and lower respiratory specimens during the acute phase of infection. Negative results do not preclude SARS-CoV-2 infection, do not rule out co-infections with other pathogens, and should not be used as the sole basis for treatment or other patient management decisions. Negative results must be combined with clinical observations, patient history, and epidemiological information. The expected result is Negative.  Fact Sheet for Patients: SugarRoll.be  Fact Sheet for Healthcare Providers: https://www.woods-mathews.com/  This test is not yet approved or cleared by the [place] FDA and  has been authorized for detection and/or diagnosis of SARS-CoV-2 by FDA under an Emergency Use Authorization (EUA). This EUA will remain  in effect (meaning this test can be used) for the duration of the COVID-19 declaration under Se ction 564(b)(1) of the Act, 21 U.S.C. section 360bbb-3(b)(1), unless the authorization is terminated or revoked sooner.  Performed at [facility], [place] [address] [place], [place] [address]   Blood Culture ID  Panel (Reflexed)     Status: Abnormal  Collection Time: 11/02/20  1:24 PM  Result Value Ref Range Status   Enterococcus faecalis NOT DETECTED NOT DETECTED Final   Enterococcus Faecium NOT DETECTED NOT DETECTED Final   Listeria monocytogenes NOT DETECTED NOT DETECTED Final   Staphylococcus species NOT DETECTED NOT DETECTED Final   Staphylococcus aureus (BCID) NOT DETECTED NOT DETECTED Final   Staphylococcus epidermidis NOT DETECTED NOT DETECTED Final   Staphylococcus lugdunensis NOT DETECTED NOT DETECTED Final   Streptococcus species DETECTED (A) NOT DETECTED Final    Comment: Not Enterococcus species, Streptococcus agalactiae, Streptococcus pyogenes, or Streptococcus pneumoniae. CRITICAL RESULT CALLED TO, READ BACK BY AND VERIFIED WITH: PHARMD S WATSON [id number] AT 1233 BY CM    Streptococcus agalactiae NOT DETECTED NOT DETECTED Final   Streptococcus pneumoniae NOT DETECTED NOT DETECTED Final   Streptococcus pyogenes NOT DETECTED NOT DETECTED Final   A.calcoaceticus-baumannii NOT DETECTED NOT DETECTED Final   Bacteroides fragilis NOT DETECTED NOT DETECTED Final   Enterobacterales NOT DETECTED NOT DETECTED Final   Enterobacter cloacae complex NOT DETECTED NOT DETECTED Final   Escherichia coli NOT DETECTED NOT DETECTED Final   Klebsiella aerogenes NOT DETECTED NOT DETECTED Final   Klebsiella oxytoca NOT DETECTED NOT DETECTED Final   Klebsiella pneumoniae NOT DETECTED NOT DETECTED Final   Proteus species NOT DETECTED NOT DETECTED Final   Salmonella species NOT DETECTED NOT DETECTED Final   Serratia marcescens NOT DETECTED NOT DETECTED Final   Haemophilus influenzae NOT DETECTED NOT DETECTED Final   Neisseria meningitidis NOT DETECTED NOT DETECTED Final   Pseudomonas aeruginosa NOT DETECTED NOT DETECTED Final   Stenotrophomonas maltophilia NOT DETECTED NOT DETECTED Final   Candida albicans NOT DETECTED NOT DETECTED Final   Candida auris NOT DETECTED NOT DETECTED Final   Candida glabrata  NOT DETECTED NOT DETECTED Final   Candida krusei NOT DETECTED NOT DETECTED Final   Candida parapsilosis NOT DETECTED NOT DETECTED Final   Candida tropicalis NOT DETECTED NOT DETECTED Final   Cryptococcus neoformans/gattii NOT DETECTED NOT DETECTED Final    Comment: Performed at [facility], [address], [place], [place] [address]  Blood culture (routine x 2)     Status: None (Preliminary result)   Collection Time: 11/02/20  1:26 PM   Specimen: BLOOD  Result Value Ref Range Status   Specimen Description BLOOD RIGHT ANTECUBITAL  Final   Special Requests   Final    BOTTLES DRAWN AEROBIC AND ANAEROBIC Blood Culture results may not be optimal due to an inadequate volume of blood received in culture bottles   Culture   Final    NO GROWTH 4 DAYS Performed at [facility], [address] [place], [place] [address]    Report Status PENDING  Incomplete  Gastrointestinal Panel by PCR , Stool     Status: None   Collection Time: 11/02/20  9:13 PM   Specimen: Stool  Result Value Ref Range Status   Campylobacter species NOT DETECTED NOT DETECTED Final   Plesimonas shigelloides NOT DETECTED NOT DETECTED Final   Salmonella species NOT DETECTED NOT DETECTED Final   Yersinia enterocolitica NOT DETECTED NOT DETECTED Final   Vibrio species NOT DETECTED NOT DETECTED Final   Vibrio cholerae NOT DETECTED NOT DETECTED Final   Enteroaggregative E coli (EAEC) NOT DETECTED NOT DETECTED Final   Enteropathogenic E coli (EPEC) NOT DETECTED NOT DETECTED Final   Enterotoxigenic E coli (ETEC) NOT DETECTED NOT DETECTED Final   Shiga like toxin producing E coli (STEC) NOT DETECTED NOT DETECTED Final  Shigella/Enteroinvasive E coli (EIEC) NOT DETECTED NOT DETECTED Final   Cryptosporidium NOT DETECTED NOT DETECTED Final   Cyclospora cayetanensis NOT DETECTED NOT DETECTED Final   Entamoeba histolytica NOT DETECTED NOT DETECTED Final   Giardia lamblia NOT DETECTED NOT DETECTED Final   Adenovirus  F40/41 NOT DETECTED NOT DETECTED Final   Astrovirus NOT DETECTED NOT DETECTED Final   Norovirus GI/GII NOT DETECTED NOT DETECTED Final   Rotavirus A NOT DETECTED NOT DETECTED Final   Sapovirus (I, II, IV, and V) NOT DETECTED NOT DETECTED Final    Comment: Performed at [facility], [address] [place], [place] [address]  Aerobic/Anaerobic Culture w Gram Stain (surgical/deep wound)     Status: None (Preliminary result)   Collection Time: 11/03/20  4:30 PM   Specimen: Abscess  Result Value Ref Range Status   Specimen Description   Final    ABSCESS Performed at [facility], [address], [place], [place] [address]    Special Requests   Final    NONE Performed at [facility], [address] [address] [place], [place] [address]    Gram Stain   Final    NO ORGANISMS SEEN SQUAMOUS EPITHELIAL CELLS PRESENT ABUNDANT WBC PRESENT,BOTH PMN AND MONONUCLEAR ABUNDANT GRAM POSITIVE COCCI Performed at [facility], [place] [address], [place], [place] [address]    Culture   Final    ABUNDANT STREPTOCOCCUS INTERMEDIUS NO ANAEROBES ISOLATED; CULTURE IN PROGRESS FOR 5 DAYS    Report Status PENDING  Incomplete   Organism ID, Bacteria STREPTOCOCCUS INTERMEDIUS  Final      Susceptibility   Streptococcus intermedius - MIC*    PENICILLIN <=0.06 SENSITIVE Sensitive     CEFTRIAXONE <=0.12 SENSITIVE Sensitive     ERYTHROMYCIN <=0.12 SENSITIVE Sensitive     LEVOFLOXACIN <=0.25 SENSITIVE Sensitive     VANCOMYCIN 0.25 SENSITIVE Sensitive     * ABUNDANT STREPTOCOCCUS INTERMEDIUS  MRSA Next Gen by PCR, Nasal     Status: None   Collection Time: 11/03/20  8:22 PM   Specimen: Nasal Mucosa; Nasal Swab  Result Value Ref Range Status   MRSA by PCR Next Gen NOT DETECTED NOT DETECTED Final    Comment: (NOTE) The GeneXpert MRSA Assay (FDA approved for NASAL specimens only), is one component of a comprehensive MRSA colonization surveillance program. It is not intended to diagnose  MRSA infection nor to guide or monitor treatment for MRSA infections. Test performance is not FDA approved in patients less than 78 years old. Performed at [facility], [address] [place],  [address]          Radiology Studies: No results found.      Scheduled Meds:  amiodarone  200 mg Oral Daily   Chlorhexidine Gluconate Cloth  6 each Topical Q0600   dextromethorphan-guaiFENesin  1 tablet Oral BID   insulin aspart  0-15 Units Subcutaneous Q4H   mouth rinse  15 mL Mouth Rinse BID   potassium & sodium phosphates  1 packet Oral TID WC & HS   sacubitril-valsartan  1 tablet Oral BID   sodium chloride flush  5 mL Intracatheter Q8H   Continuous Infusions:  sodium chloride Stopped (11/04/20 0534)   ampicillin-sulbactam (UNASYN) IV 3 g (11/06/20 1253)     LOS: 4 days    Time spent: 25 minutes    [name], MD [facility]   To contact the attending provider between 7A-7P or the covering provider during after hours 7P-7A, please log  into the web site [email or web address] and access using universal [place] password for that web site. If you do not have the password, please call the hospital operator.  11/06/2020, 1:58 PM

## 2020-11-06 NOTE — Progress Notes (Signed)
   Date of Admission:  11/02/2020     ID: Stacy Cox is a 65 y.o. female  Principal Problem:   Sepsis secondary to acute diverticulitis Active Problems:   Polycythemia vera ([place])   Acute diverticulitis   Tobacco abuse   Type 2 diabetes mellitus    Liver abscess   COPD (chronic obstructive pulmonary disease) (HCC)   Essential hypertension   Drop in hemoglobin   AF (paroxysmal atrial fibrillation) (HCC)   Chronic systolic CHF (congestive heart failure) (HCC)   Obesity, Class III, BMI 40-49.9 (morbid obesity) (HCC)    Subjective: Feeling better No fveer Pain rt upper quadrant better Has CPAp  Medications:   amiodarone  200 mg Oral Daily   Chlorhexidine Gluconate Cloth  6 each Topical Q0600   dextromethorphan-guaiFENesin  1 tablet Oral BID   insulin aspart  0-15 Units Subcutaneous Q4H   mouth rinse  15 mL Mouth Rinse BID   potassium & sodium phosphates  1 packet Oral TID WC & HS   sacubitril-valsartan  1 tablet Oral BID   sodium chloride flush  5 mL Intracatheter Q8H    Objective: Vital signs in last 24 hours: Temp:  [98.2 F (36.8 C)-100.2 F (37.9 C)] 98.8 F (37.1 C) (10/06 1400) Pulse Rate:  [73-91] 82 (10/06 1258) Resp:  [17-35] 20 (10/06 1258) BP: (114-136)/(62-83) 126/70 (10/06 1258) SpO2:  [91 %-97 %] 95 % (10/06 1258)  PHYSICAL EXAM:  General: Alert, cooperative, no distress, appears stated age.  Lungs: Clear to auscultation bilaterally. No Wheezing or Rhonchi. No rales. Heart: Regular rate and rhythm, no murmur, rub or gallop. Abdomen: Soft, 2 cath rt Upper quadrant Extremities: atraumatic, no cyanosis. No edema. No clubbing Skin: No rashes or lesions. Or bruising Lymph: Cervical, supraclavicular normal. Neurologic: Grossly non-focal  Lab Results Recent Labs    11/05/20 0413 11/06/20 0420  WBC 18.7* 16.3*  HGB 13.2 13.6  HCT 36.5 37.2  NA 137 137  K 3.4* 4.2  CL 102 104  CO2 27 25  BUN 16 11  CREATININE 0.61 0.73   Liver Panel Recent  Labs    11/04/20 0614  PROT 5.3*  ALBUMIN 2.1*  AST 66*  ALT 61*  ALKPHOS 106  BILITOT 3.3*   Sedimentation Rate No results for input(s): ESRSEDRATE in the last 72 hours. C-Reactive Protein No results for input(s): CRP in the last 72 hours.  Microbiology: 11/02/20 Streptococcus intermedius blood culture  11/03/20 - liver abscess strep intermedius 10/6- BC sent Studies/Results: [place] EKG SITE RITE  Result Date: 11/06/2020 If Site Rite image not attached, placement could not be confirmed due to current cardiac rhythm.    Assessment/Plan: Sepsis secondary to liver abscess.  Streptococcus intermedius in culture so far. Status post drainage Streptococcus bacteremia secondary to perforated diverticulitis and liver abscess. Pt is on  ampicillin sulbactam. On discharge she will go on 4 weeks of Iv ceftriaxone and Po flagyl   Episode of atrial fibrillation has resolved.  Type 2 diabetes with neuropathy. On insulin  Discussed the management in great detail with patient , her daughter Hayes Ludwig and care team OPAT note placed Will follow her as OP She needs to follow up with IR for removal of the catheters as OP Pt and daughter to be educated on the  Management of catheters at home   Dr snider is covering for the next 3 days by phone- call if needed

## 2020-11-06 NOTE — Progress Notes (Signed)
SUBJECTIVE: Stacy Cox is a 65 y.o. female past medical history of COPD, CHF, diabetes, GERD, hypertension, hyperlipidemia, polycythemia vera who presented with fevers, shortness of breath cough etc.  Patient notes that all of her symptoms started about 1 week ago.     Cardiology consulted for atrial fibrillation with RVR. Patient converted. Now taking amiodarone 200 mg once daily. Patient denies current chest pain, shortness of breath.   Vitals:   11/06/20 0300 11/06/20 0400 11/06/20 0600 11/06/20 0700  BP:  117/72 131/70   Pulse: 78 80 73 91  Resp: (!) 32 (!) 24 (!) 22 (!) 28  Temp:  98.2 F (36.8 C)    TempSrc:  Oral    SpO2: 94% 95% 91% 93%  Weight:      Height:        Intake/Output Summary (Last 24 hours) at 11/06/2020 0848 Last data filed at 11/06/2020 0400 Gross per 24 hour  Intake 54.86 ml  Output 2070 ml  Net -2015.14 ml    LABS: Basic Metabolic Panel: Recent Labs    11/04/20 0614 11/05/20 0413 11/06/20 0420  NA 134* 137 137  K 3.4* 3.4* 4.2  CL 101 102 104  CO2 26 27 25   GLUCOSE 152* 89 103*  BUN 18 16 11   CREATININE 0.80 0.61 0.73  CALCIUM 7.7* 8.0* 8.3*  MG 2.4  --  1.9  PHOS 4.4  --  2.4*   Liver Function Tests: Recent Labs    11/04/20 0614  AST 66*  ALT 61*  ALKPHOS 106  BILITOT 3.3*  PROT 5.3*  ALBUMIN 2.1*   No results for input(s): LIPASE, AMYLASE in the last 72 hours. CBC: Recent Labs    11/04/20 0614 11/04/20 1425 11/05/20 0413 11/06/20 0420  WBC 26.9*  --  18.7* 16.3*  NEUTROABS 22.4*  --   --  11.1*  HGB 12.6   < > 13.2 13.6  HCT 34.5*  --  36.5 37.2  MCV 88.2  --  88.4 89.4  PLT 167  --  200 226   < > = values in this interval not displayed.   Cardiac Enzymes: No results for input(s): CKTOTAL, CKMB, CKMBINDEX, TROPONINI in the last 72 hours. BNP: Invalid input(s): POCBNP D-Dimer: No results for input(s): DDIMER in the last 72 hours. Hemoglobin A1C: No results for input(s): HGBA1C in the last 72 hours. Fasting Lipid  Panel: No results for input(s): CHOL, HDL, LDLCALC, TRIG, CHOLHDL, LDLDIRECT in the last 72 hours. Thyroid Function Tests: No results for input(s): TSH, T4TOTAL, T3FREE, THYROIDAB in the last 72 hours.  Invalid input(s): FREET3 Anemia Panel: No results for input(s): VITAMINB12, FOLATE, FERRITIN, TIBC, IRON, RETICCTPCT in the last 72 hours.   PHYSICAL EXAM General: Well developed, well nourished, in no acute distress HEENT:  Normocephalic and atramatic Neck:  No JVD.  Lungs: Clear bilaterally to auscultation and percussion. Heart: HRRR . Normal S1 and S2 without gallops or murmurs.  Abdomen: Bowel sounds are positive, abdomen soft and non-tender  Msk:  Back normal, normal gait. Normal strength and tone for age. Extremities: No clubbing, cyanosis or edema.   Neuro: Alert and oriented X 3. Psych:  Good affect, responds appropriately  TELEMETRY: NSR, HR 84 bpm  ASSESSMENT AND PLAN: Patient denies chest pain at this time. Heart function has decreased to 25-30% since 2019 echo. Start Entresto twice daily in place of home losartan. No other changes at this time. Will continue to observe.  Principal Problem:   Sepsis secondary to acute  diverticulitis Active Problems:   Polycythemia vera (HCC)   Acute diverticulitis   Tobacco abuse   Type 2 diabetes mellitus    Liver abscess   COPD (chronic obstructive pulmonary disease) (HCC)   CHF (congestive heart failure) (HCC)   Drop in hemoglobin   AF (paroxysmal atrial fibrillation) ([place])    Harjot Zavadil, FNP-C 11/06/2020 8:48 AM

## 2020-11-06 NOTE — Progress Notes (Signed)
Patient had 2 percutaneous pigtail drainage catheters placed by IR during this admission. When planning for discharge, the patient should discharge with a prescription for saline flushes and flush drainage catheters with 5 cc of sterile saline daily and record output daily.   Order to our IR clinic was placed and the patient will hear from our clinic staff to schedule CT imaging and drain follow up appointment time.  [facility] [place]  [address]  [place] [address] [phone]  Hedy Jacob, [name] 11/06/2020, 3:21 PM

## 2020-11-06 NOTE — TOC Initial Note (Signed)
Transition of Care [facility]) - Initial/Assessment Note    Patient Details  Name: Stacy Cox MRN: [id number] Date of Birth: 05-Feb-1955  Transition of Care [facility]) CM/SW Contact:    Kerin Salen, RN Phone Number: 11/06/2020, 2:20 PM  Clinical Narrative: Spoke with patient, daughter at bedside. Patient alert and oriented x4, voices living with daughter and grandkids independent with ADL's do not use assisted device. Use [employer] medication delivery service. Use C-Pap at night, could not remember name of agency. Never used [place] services and prefer not to have it, feels confident that she can take care of herself. No TOC needs identified at this time, will continue to track for discharge needs.                 Expected Discharge Plan: Home/Self Care Barriers to Discharge: Continued Medical Work up   Patient Goals and CMS Choice Patient states their goals for this hospitalization and ongoing recovery are:: To return home with family.   Choice offered to / list presented to : NA  Expected Discharge Plan and Services Expected Discharge Plan: Home/Self Care     Post Acute Care Choice: NA Living arrangements for the past 2 months: Single Family Home                                      Prior Living Arrangements/Services Living arrangements for the past 2 months: Single Family Home Lives with:: Adult Children, Minor Children Patient language and need for interpreter reviewed:: Yes Do you feel safe going back to the place where you live?: Yes      Need for Family Participation in Patient Care: No (Comment) Care giver support system in place?: Yes (comment)   Criminal Activity/Legal Involvement Pertinent to Current Situation/Hospitalization: No - Comment as needed  Activities of Daily Living Home Assistive Devices/Equipment: None ADL Screening (condition at time of admission) Patient's cognitive ability adequate to safely complete daily activities?: Yes Is the patient deaf or  have difficulty hearing?: No Does the patient have difficulty seeing, even when wearing glasses/contacts?: No Does the patient have difficulty concentrating, remembering, or making decisions?: No Patient able to express need for assistance with ADLs?: Yes Does the patient have difficulty dressing or bathing?: No Independently performs ADLs?: Yes (appropriate for developmental age) Does the patient have difficulty walking or climbing stairs?: No Weakness of Legs: None Weakness of Arms/Hands: None  Permission Sought/Granted Permission sought to share information with : Case Manager                Emotional Assessment Appearance:: Appears stated age Attitude/Demeanor/Rapport: Engaged Affect (typically observed): Accepting Orientation: : Oriented to Self, Oriented to Place, Oriented to  Time, Oriented to Situation Alcohol / Substance Use: Not Applicable Psych Involvement: No (comment)  Admission diagnosis:  Acute respiratory failure (HCC) [J96.00] Abscess [L02.91] Diverticulitis [K57.92] Liver abscess [K75.0] Sepsis ([place]) [A41.9] Sepsis, due to unspecified organism, unspecified whether acute organ dysfunction present [facility]) [A41.9] Patient Active Problem List   Diagnosis Date Noted   Chronic systolic CHF (congestive heart failure) ([place]) 11/06/2020   Obesity, Class III, BMI 40-49.9 (morbid obesity) ([place]) 11/06/2020   Drop in hemoglobin    AF (paroxysmal atrial fibrillation) (HCC)    Essential hypertension    Chronic diastolic CHF (congestive heart failure) (HCC)    Lactic acidosis    Hypokalemia    Acute respiratory failure (HCC)    Liver abscess  11/02/2020   COPD (chronic obstructive pulmonary disease) (HCC)    CHF (congestive heart failure) ([place])    Acute diverticulitis 05/02/2015   Sepsis secondary to acute diverticulitis 05/02/2015   Tobacco abuse 05/02/2015   Type 2 diabetes mellitus  05/02/2015   Polycythemia vera ([place]) 12/13/2014   PCP:  Donnie Coffin,  MD Pharmacy:   [facility] [address], [place] [address] Phone: [phone] Fax: [phone]     Social Determinants of Health (SDOH) Interventions    Readmission Risk Interventions No flowsheet data found.

## 2020-11-06 NOTE — Progress Notes (Signed)
Secure chat with Dr Delaine Lame, who included Dr Roosevelt Locks re PICC order and blood cx results.  ID states to cancel the PICC order and wait on repeat culture results.

## 2020-11-06 NOTE — Treatment Plan (Signed)
Diagnosis: Streptococcus bacteremia Liver abscesses Diverticular abscess  Baseline Creatinine <1    No Known Allergies  OPAT Orders Discharge antibiotics: Ceftriaxone 2 grams Iv Q d for 4 weeks  Flagyl 500mg  PO Q 12 for 4 weeks   End Date: 12/03/20   [facility] Care Per Protocol:  Labs weekly while on IV antibiotics: _X_ CBC with differential  __X CMP   _X_ Please pull PIC at completion of IV antibiotics   Fax weekly labs to [phone]  Clinic Follow Up Appt: 11/27/20 at noon    Call [phone] with any questions

## 2020-11-07 ENCOUNTER — Inpatient Hospital Stay: Payer: Self-pay

## 2020-11-07 LAB — CULTURE, BLOOD (ROUTINE X 2): Culture: NO GROWTH

## 2020-11-07 LAB — GLUCOSE, CAPILLARY
Glucose-Capillary: 119 mg/dL — ABNORMAL HIGH (ref 70–99)
Glucose-Capillary: 111 mg/dL — ABNORMAL HIGH (ref 70–99)
Glucose-Capillary: 135 mg/dL — ABNORMAL HIGH (ref 70–99)
Glucose-Capillary: 107 mg/dL — ABNORMAL HIGH (ref 70–99)

## 2020-11-07 MED ORDER — CEFTRIAXONE IV (FOR PTA / DISCHARGE USE ONLY): 2.0000 g | INTRAVENOUS | 0 refills | Status: AC

## 2020-11-07 MED ORDER — AMIODARONE HCL 200 MG PO TABS: 200.0000 mg | ORAL_TABLET | Freq: Every day | ORAL | 0 refills | Status: DC

## 2020-11-07 MED ORDER — APIXABAN 5 MG PO TABS: 5.0000 mg | ORAL_TABLET | Freq: Two times a day (BID) | ORAL | 0 refills | Status: DC

## 2020-11-07 MED ORDER — SACUBITRIL-VALSARTAN 24-26 MG PO TABS: 1.0000 | ORAL_TABLET | Freq: Two times a day (BID) | ORAL | 0 refills | Status: DC

## 2020-11-07 MED ORDER — SODIUM CHLORIDE 0.9 % IV SOLN
2.0000 g | INTRAVENOUS | Status: DC
  Administered 2020-11-07: 2 g via INTRAVENOUS
  Filled 2020-11-07: qty 2
  Filled 2020-11-07: qty 20

## 2020-11-07 MED ORDER — METRONIDAZOLE 500 MG PO TABS
500.0000 mg | ORAL_TABLET | Freq: Two times a day (BID) | ORAL | Status: DC
  Administered 2020-11-07: 500 mg via ORAL
  Filled 2020-11-07 (×2): qty 1

## 2020-11-07 NOTE — TOC Progression Note (Signed)
Transition of Care [facility]) - Progression Note    Patient Details  Name: Stacy Cox MRN: [id number] Date of Birth: 06/15/1955  Transition of Care [facility]) CM/SW [place], RN Phone Number: 11/07/2020, 9:25 AM  Clinical Narrative: Received a call from Carolynn Sayers about in-home infusion plan for discharge. Pam will provide teaching with patient and daughter at 3:45pm this afternoon. Pam will also set up [facility] visits.     Expected Discharge Plan: Home/Self Care Barriers to Discharge: Continued Medical Work up  Expected Discharge Plan and Services Expected Discharge Plan: Home/Self Care     Post Acute Care Choice: NA Living arrangements for the past 2 months: Single Family Home                                       Social Determinants of Health (SDOH) Interventions    Readmission Risk Interventions No flowsheet data found.

## 2020-11-07 NOTE — TOC Progression Note (Signed)
Transition of Care [facility]) - Progression Note    Patient Details  Name: Stacy Cox MRN: [id number] Date of Birth: [birth year]  Transition of Care [facility]) CM/SW [place], RN Phone Number: 11/07/2020, 3:41 PM  Clinical Narrative:  Patient to discharge home with daughter with [facility] for Antibiotic infusion. Carolynn Sayers to provide home Infusion education with patient and daughter today. Alvis Lemmings to provide [facility] nursing services. TOC barriers resolved.     Expected Discharge Plan: Home/Self Care Barriers to Discharge: Barriers Resolved  Expected Discharge Plan and Services Expected Discharge Plan: Home/Self Care     Post Acute Care Choice: NA Living arrangements for the past 2 months: Single Family Home Expected Discharge Date: 11/07/20               DME Arranged: Other see comment (Home Infusion) DME Agency: Other - Comment ([facility]) Date DME Agency Contacted: 11/07/20 Time DME Agency Contacted: [id number] Representative spoke with at DME Agency: [place]: RN [place] Agency: [place] Date [place]: 11/07/20   Representative spoke with at [place]: Carolynn Sayers notified [place]   Social Determinants of Health ([place]) Interventions    Readmission Risk Interventions No flowsheet data found.

## 2020-11-07 NOTE — Progress Notes (Addendum)
Patient's and both daughters have refused to be tested for home oxygen.  Patient wears CPAP at night with 2 L [place] and per patient and family is fine the rest of the time.  Educated patient about her SOB on exertion and she said this was not new and doesn't impair her ability to perform ADLs and live.

## 2020-11-07 NOTE — Progress Notes (Signed)
SUBJECTIVE: Stacy Cox is a 65 y.o. female past medical history of COPD, CHF, diabetes, GERD, hypertension, hyperlipidemia, polycythemia vera who presented with fevers, shortness of breath cough.     Cardiology consulted for atrial fibrillation with RVR. Patient converted. Now taking amiodarone 200 mg once daily. Patient denies current chest pain, shortness of breath.   Vitals:   11/06/20 2000 11/07/20 0000 11/07/20 0400 11/07/20 0800  BP: 124/75 (!) 121/56 (!) 134/55 125/77  Pulse: 85 79 95 78  Resp: (!) 24 (!) 24 19 (!) 21  Temp: 98.9 F (37.2 C) 98.8 F (37.1 C)  98.3 F (36.8 C)  TempSrc: Oral Oral  Oral  SpO2: 96% 92% 97% 97%  Weight:      Height:        Intake/Output Summary (Last 24 hours) at 11/07/2020 0845 Last data filed at 11/07/2020 0600 Gross per 24 hour  Intake 415.4 ml  Output 2070 ml  Net -1654.6 ml    LABS: Basic Metabolic Panel: Recent Labs    11/05/20 0413 11/06/20 0420  NA 137 137  K 3.4* 4.2  CL 102 104  CO2 27 25  GLUCOSE 89 103*  BUN 16 11  CREATININE 0.61 0.73  CALCIUM 8.0* 8.3*  MG  --  1.9  PHOS  --  2.4*   Liver Function Tests: No results for input(s): AST, ALT, ALKPHOS, BILITOT, PROT, ALBUMIN in the last 72 hours. No results for input(s): LIPASE, AMYLASE in the last 72 hours. CBC: Recent Labs    11/05/20 0413 11/06/20 0420  WBC 18.7* 16.3*  NEUTROABS  --  11.1*  HGB 13.2 13.6  HCT 36.5 37.2  MCV 88.4 89.4  PLT 200 226   Cardiac Enzymes: No results for input(s): CKTOTAL, CKMB, CKMBINDEX, TROPONINI in the last 72 hours. BNP: Invalid input(s): POCBNP D-Dimer: No results for input(s): DDIMER in the last 72 hours. Hemoglobin A1C: No results for input(s): HGBA1C in the last 72 hours. Fasting Lipid Panel: No results for input(s): CHOL, HDL, LDLCALC, TRIG, CHOLHDL, LDLDIRECT in the last 72 hours. Thyroid Function Tests: No results for input(s): TSH, T4TOTAL, T3FREE, THYROIDAB in the last 72 hours.  Invalid input(s):  FREET3 Anemia Panel: No results for input(s): VITAMINB12, FOLATE, FERRITIN, TIBC, IRON, RETICCTPCT in the last 72 hours.   PHYSICAL EXAM General: Well developed, well nourished, in no acute distress HEENT:  Normocephalic and atramatic Neck:  No JVD.  Lungs: Clear bilaterally to auscultation and percussion. Heart: HRRR . Normal S1 and S2 without gallops or murmurs.  Abdomen: Bowel sounds are positive, abdomen soft and non-tender  Msk:  Back normal, normal gait. Normal strength and tone for age. Extremities: No clubbing, cyanosis or edema.   Neuro: Alert and oriented X 3. Psych:  Good affect, responds appropriately  TELEMETRY: NSR, HR 89 bpm  ASSESSMENT AND PLAN: Patient may be discharged home from a cardiac standpoint. Continue amiodarone 200 mg daily and Entresto 24-26 twice daily. Patient should follow up in the office on Thursday, 11/13/20 at 10:00 am.   Principal Problem:   Sepsis secondary to acute diverticulitis Active Problems:   Polycythemia vera ([place])   Acute diverticulitis   Tobacco abuse   Type 2 diabetes mellitus    Liver abscess   COPD (chronic obstructive pulmonary disease) ([place])   Essential hypertension   Drop in hemoglobin   AF (paroxysmal atrial fibrillation) (HCC)   Chronic systolic CHF (congestive heart failure) (HCC)   Obesity, Class III, BMI 40-49.9 (morbid obesity) ([place])  Keelyn Fjelstad, FNP-C 11/07/2020 8:45 AM

## 2020-11-07 NOTE — Progress Notes (Signed)
Peripherally Inserted Central Catheter Placement  The IV Nurse has discussed with the patient and/or persons authorized to consent for the patient, the purpose of this procedure and the potential benefits and risks involved with this procedure.  The benefits include less needle sticks, lab draws from the catheter, and the patient may be discharged home with the catheter. Risks include, but not limited to, infection, bleeding, blood clot (thrombus formation), and puncture of an artery; nerve damage and irregular heartbeat and possibility to perform a PICC exchange if needed/ordered by physician.  Alternatives to this procedure were also discussed.  Bard Power PICC patient education guide, fact sheet on infection prevention and patient information card has been provided to patient /or left at bedside.    PICC Placement Documentation  PICC Single Lumen 96/29/52 Right Cephalic 44 cm 0 cm (Active)  Indication for Insertion or Continuance of Line Home intravenous therapies (PICC only) 11/07/20 1944  Exposed Catheter (cm) 0 cm 11/07/20 1944  Site Assessment Clean;Dry;Intact 11/07/20 1944  Line Status Blood return noted;Flushed;Saline locked 11/07/20 1944  Dressing Type Transparent;Securing device 11/07/20 1944  Dressing Status Clean;Dry;Intact 11/07/20 1944  Antimicrobial disc in place? Yes 11/07/20 1944  Safety Lock Not Applicable 84/13/24 4010  Line Care Connections checked and tightened 11/07/20 1944  Line Adjustment (NICU/IV Team Only) No 11/07/20 1944  Dressing Intervention New dressing 11/07/20 1944  Dressing Change Due 11/14/20 11/07/20 1944       Edson Snowball 11/07/2020, 7:59 PM

## 2020-11-07 NOTE — Discharge Summary (Addendum)
Physician Discharge Summary  Patient ID: Stacy Cox MRN: [id number] DOB/AGE: [birth year] 65 y.o.  Admit date: 11/02/2020 Discharge date: 11/07/2020  Admission Diagnoses:  Discharge Diagnoses:  Principal Problem:   Sepsis secondary to acute diverticulitis Active Problems:   Polycythemia vera ([place])   Acute diverticulitis   Tobacco abuse   Type 2 diabetes mellitus    Liver abscess   COPD (chronic obstructive pulmonary disease) (HCC)   Essential hypertension   Drop in hemoglobin   AF (paroxysmal atrial fibrillation) (HCC)   Chronic systolic CHF (congestive heart failure) (HCC)   Obesity, Class III, BMI 40-49.9 (morbid obesity) ([place])   Discharged Condition: good  Hospital Course:  65 year old female with Past medical history of COPD, CHF, type 2 diabetes mellitus, GERD, hypertension, polycythemia vera.  She was admitted to the hospital on 11/02/2020 with clinical sepsis acute diverticulitis and liver abscesses.  Interventional radiology was able to place a drain in the abscess on 11/03/2020.  Strep species growing out of 1 blood culture.  Abscess culture has gram-positive cocci.  After the patient is drained procedure yesterday patient was tachycardic up into the 150s and had altered mental status and tachypnea and was transferred to the ICU to stepdown.  Sepsis secondary to liver abscess. Liver abscess secondary to acute diverticulitis due to Streptococcus intermedius. Acute diverticulitis. Streptococcus intermedius septicemia. Patient condition is improving.  Patient will need Rocephin and oral Flagyl for 4 weeks per ID. I have called microbiology lab, blood culture negative in 24 hours, however, results will not be reported and to 48 hours.  As result, I will place a PICC line and then discharge patient.  Set up home care.   Chronic systolic congestive heart failure. Essential hypertension  Relative hypotension due to sepsis at time admission. Condition is stable.  No  exacerbation. Cardiology has been seeing patient, started Entresto.   Beta-blocker was on hold due to relative hypotension. Will schedule cardiology follow-up in 2 weeks.   Brief episode of paroxysmal atrial fibrillation. Followed by cardiology, okay to restart Eliquis.  Type 2 diabetes with diabetic neuropathy. Continue to follow.  Hypokalemia Hypophosphatemia Hypomagnesemia. Improved   Obstructive sleep apnea. Hypoxemia. Likely chronic.  Patient developed hypoxemia appears to be secondary to obstructive sleep apnea.  Patient was a placed on CPAP, hypoxemia seems to be better.  However, patient does has chronic congestive heart failure, will obtain home oxygen evaluation.  Diuretics are just restarted for congestive heart failure.   Consults: cardiology, ID, and general surgery  Significant Diagnostic Studies:    Treatments: antibiotics  Discharge Exam: Blood pressure 125/77, pulse 65, temperature 98.3 F (36.8 C), temperature source Oral, resp. rate (!) 30, height _0  (1.727 m), weight 111.7 kg, SpO2 93 %. General appearance: alert and cooperative Resp: clear to auscultation bilaterally Cardio: regular rate and rhythm, S1, S2 normal, no murmur, click, rub or gallop GI: soft, non-tender; bowel sounds normal; no masses,  no organomegaly Extremities: extremities normal, atraumatic, no cyanosis or edema  Disposition: Discharge disposition: 01-Home or Self Care       Discharge Instructions     [facility] pharmacist to adjust dose for Vancomycin, Aminoglycosides and other anti-infective therapies as requested by physician.   Complete by: As directed    [facility] to provide Cath Flo 24m   Complete by: As directed    Administer for PICC line occlusion and as ordered by physician for other access device issues.   Anaphylaxis Kit: Provided to treat any anaphylactic reaction to  the medication being provided to the patient if First Dose or when  requested by physician   Complete by: As directed    Epinephrine 19m/ml vial / amp: Administer 0.38m(0.67m75msubcutaneously once for moderate to severe anaphylaxis, nurse to call physician and pharmacy when reaction occurs and call 911 if needed for immediate care   Diphenhydramine 49m64m IV vial: Administer 25-49mg67mIM PRN for first dose reaction, rash, itching, mild reaction, nurse to call physician and pharmacy when reaction occurs   Sodium Chloride 0.9% NS 500ml 60mAdminister if needed for hypovolemic blood pressure drop or as ordered by physician after call to physician with anaphylactic reaction   Change dressing on IV access line weekly and PRN   Complete by: As directed    Diet - low sodium heart healthy   Complete by: As directed    Discharge wound care:   Complete by: As directed    Follow with pcp   Flush IV access with Sodium Chloride 0.9% and Heparin 10 units/ml or 100 units/ml   Complete by: As directed    Home infusion instructions - [facility]   Complete by: As directed    Instructions: Flush IV access with Sodium Chloride 0.9% and Heparin 10units/ml or 100units/ml   Change dressing on IV access line: Weekly and PRN   Instructions Cath Flo 2mg: A65mnister for PICC Line occlusion and as ordered by physician for other access device   [facility] pharmacist to adjust dose for: Vancomycin, Aminoglycosides and other anti-infective therapies as requested by physician   Increase activity slowly   Complete by: As directed    Method of administration may be changed at the discretion of home infusion pharmacist based upon assessment of the patient and/or caregiver's ability to self-administer the medication ordered   Complete by: As directed       Allergies as of 11/07/2020   No Known Allergies      Medication List     STOP taking these medications    Coreg CR 80 MG 24 hr capsule Generic drug: carvedilol   losartan 25 MG tablet Commonly known  as: COZAAR       TAKE these medications    amiodarone 200 MG tablet Commonly known as: PACERONE Take 1 tablet (200 mg total) by mouth daily. Start taking on: November 08, 2020   apixaban 5 MG Tabs tablet Commonly known as: ELIQUIS Take 1 tablet (5 mg total) by mouth 2 (two) times daily.   aspirin 325 MG EC tablet Take 325 mg by mouth daily.   b complex vitamins tablet Take 1 tablet by mouth every other day.   cefTRIAXone  IVPB Commonly known as: ROCEPHIN Inject 2 g into the vein daily for 25 days. Indication:  streptococcus bacteremia with liver/diverticular abscess First Dose: Yes Last Day of Therapy:  12/03/2020 Labs - Once weekly:  CBC/D and CMP Please pull PIC at completion of IV antibiotics Fax weekly labs to [phone] of administration: IV Push Method of administration may be changed at the discretion of home infusion pharmacist based upon assessment of the patient and/or caregiver's ability to self-administer the medication ordered. Start taking on: November 08, 2020   furosemide 40 MG tablet Commonly known as: LASIX Take 1 tablet by mouth daily.   gabapentin 100 MG capsule Commonly known as: NEURONTIN Take 100-200 mg by mouth 3 (three) times daily.   lactobacillus acidophilus & bulgar chewable tablet Chew 1 tablet by mouth 3 (three) times daily  with meals.   metFORMIN 500 MG tablet Commonly known as: GLUCOPHAGE Take 1 tablet by mouth daily with breakfast.   MULTIVITAL PO Take 1 tablet by mouth daily.   pantoprazole 40 MG tablet Commonly known as: PROTONIX Take 1 tablet by mouth daily.   potassium chloride SA 20 MEQ tablet Commonly known as: KLOR-CON Take 1 tablet by mouth daily.   sacubitril-valsartan 24-26 MG Commonly known as: ENTRESTO Take 1 tablet by mouth 2 (two) times daily.   spironolactone 25 MG tablet Commonly known as: ALDACTONE Take 1 tablet by mouth daily.               Discharge Care Instructions  (From  admission, onward)           Start     Ordered   11/07/20 0000  Change dressing on IV access line weekly and PRN  (Home infusion instructions - [facility] )        11/07/20 1505   11/07/20 0000  Discharge wound care:       Comments: Follow with pcp   11/07/20 1505            Follow-up Information     [name], [place] F, MD. Schedule an appointment as soon as possible for a visit in 3 week(s).   Specialty: General Surgery Why: hospital follow up, history of diverticulitis, liver abscess s/p drain Contact information: [address] [place] [address] [phone]         Sandi Mariscal, MD Follow up.   Specialties: Interventional Radiology, Radiology Why: Our clinic will call patient to schedule follow-up imaging and appointment. Contact information: [address] [place] [address] [phone]         Tomasa Hose A, MD Follow up in 1 week(s).   Specialty: Family Medicine Contact information: [place] [address] [phone]         Dionisio David, MD Follow up in 1 week(s).   Specialty: Cardiology Contact information: [address] [place] [address] [phone]               34 minutes  Signed: [name] 11/07/2020, 3:10 PM

## 2020-11-07 NOTE — Progress Notes (Signed)
PHARMACY CONSULT NOTE FOR:  OUTPATIENT  PARENTERAL ANTIBIOTIC THERAPY (OPAT)  Indication: Streptococcus bacteremia and liver/diverticular abscess Regimen: ceftriaxone 2gm IV q24h with metronidazole PO 500mg  BID End date: 12/03/2020  IV antibiotic discharge orders are pended. To discharging provider:  please sign these orders via discharge navigator,  Select New Orders & click on the button choice - Manage This Unsigned Work.     Thank you for allowing pharmacy to be a part of this patient's care.  Doreene Eland, PharmD, BCPS.   Work Cell: [phone] 11/07/2020 7:44 AM

## 2020-11-08 NOTE — Progress Notes (Signed)
Brief Pharmacy Note  Prescriptions sent to patient's pharmacy that is closed on the weekend. Pharmacist contacted by Dr. Roosevelt Locks. I called patient's daughter and explained that IV Rocephin would be delivered to the home today and that I could call in the Flagyl and patient's cardiac medications in to [facility]. Confirmed [facility] on [address].  Prescriptions for the following sent with NO REFILLS:  Metronidazole 500 mg BID x 52 doses (per OPAT order) Amiodarone 200 mg daily Eliquis 5 mg BID Entresto 24-26 mg BID  Tawnya Crook, PharmD, BCPS Clinical Pharmacist 11/08/2020 10:29 AM

## 2020-11-09 LAB — AEROBIC/ANAEROBIC CULTURE W GRAM STAIN (SURGICAL/DEEP WOUND)

## 2020-11-10 ENCOUNTER — Other Ambulatory Visit: Payer: Self-pay | Admitting: Surgery

## 2020-11-10 DIAGNOSIS — K5720 Diverticulitis of large intestine with perforation and abscess without bleeding: Secondary | ICD-10-CM

## 2020-11-10 DIAGNOSIS — L0291 Cutaneous abscess, unspecified: Secondary | ICD-10-CM

## 2020-11-11 LAB — CULTURE, BLOOD (ROUTINE X 2)
Culture: NO GROWTH
Culture: NO GROWTH

## 2020-11-18 ENCOUNTER — Telehealth: Payer: Self-pay | Admitting: Infectious Diseases

## 2020-11-18 ENCOUNTER — Encounter: Payer: Self-pay | Admitting: Emergency Medicine

## 2020-11-18 ENCOUNTER — Other Ambulatory Visit: Payer: Self-pay

## 2020-11-18 DIAGNOSIS — E119 Type 2 diabetes mellitus without complications: Secondary | ICD-10-CM | POA: Diagnosis not present

## 2020-11-18 DIAGNOSIS — Z4800 Encounter for change or removal of nonsurgical wound dressing: Secondary | ICD-10-CM | POA: Diagnosis not present

## 2020-11-18 DIAGNOSIS — Z7901 Long term (current) use of anticoagulants: Secondary | ICD-10-CM | POA: Diagnosis not present

## 2020-11-18 DIAGNOSIS — I110 Hypertensive heart disease with heart failure: Secondary | ICD-10-CM | POA: Insufficient documentation

## 2020-11-18 DIAGNOSIS — Z7982 Long term (current) use of aspirin: Secondary | ICD-10-CM | POA: Diagnosis not present

## 2020-11-18 DIAGNOSIS — Z79899 Other long term (current) drug therapy: Secondary | ICD-10-CM | POA: Insufficient documentation

## 2020-11-18 DIAGNOSIS — F17210 Nicotine dependence, cigarettes, uncomplicated: Secondary | ICD-10-CM | POA: Diagnosis not present

## 2020-11-18 DIAGNOSIS — I5022 Chronic systolic (congestive) heart failure: Secondary | ICD-10-CM | POA: Diagnosis not present

## 2020-11-18 DIAGNOSIS — Z7984 Long term (current) use of oral hypoglycemic drugs: Secondary | ICD-10-CM | POA: Insufficient documentation

## 2020-11-18 DIAGNOSIS — I4891 Unspecified atrial fibrillation: Secondary | ICD-10-CM | POA: Insufficient documentation

## 2020-11-18 DIAGNOSIS — J449 Chronic obstructive pulmonary disease, unspecified: Secondary | ICD-10-CM | POA: Diagnosis not present

## 2020-11-18 NOTE — Telephone Encounter (Signed)
Labs reviewed. 10 13 draw date Wbc 16.7, plt 548 Ct 0.96 alk phos up some at 173  Cont ceftriaxone and flagyl

## 2020-11-18 NOTE — ED Triage Notes (Signed)
Pt to ED via POV with c/o she thinks that her drain may be pulled out of her of her RUQ. She thinks that she may of pulled out some of the stitches. Her drain still has as blood in it. Home health care came out today and changed her dressing. She reports that it wasn't bleeding when she was there. She states that her pain is worse and her daughter came to check it and seen that there was dried blood on it. She reports that she is suppose to go to [place] to have her drain checked tomorrow.

## 2020-11-19 ENCOUNTER — Ambulatory Visit
Admission: RE | Admit: 2020-11-19 | Discharge: 2020-11-19 | Disposition: A | Discharge status: Home / Self Care | Payer: Medicare HMO | Source: Ambulatory Visit | Attending: Radiology | Admitting: Radiology

## 2020-11-19 ENCOUNTER — Ambulatory Visit
Admission: RE | Admit: 2020-11-19 | Discharge: 2020-11-19 | Disposition: A | Discharge status: Home / Self Care | Payer: Medicare HMO | Source: Ambulatory Visit | Attending: Surgery | Admitting: Surgery

## 2020-11-19 ENCOUNTER — Encounter: Payer: Self-pay | Admitting: *Deleted

## 2020-11-19 ENCOUNTER — Emergency Department
Admission: EM | Admit: 2020-11-19 | Discharge: 2020-11-19 | Disposition: A | Discharge status: Home / Self Care | Payer: Medicare HMO | Attending: Emergency Medicine | Admitting: Emergency Medicine

## 2020-11-19 DIAGNOSIS — K5720 Diverticulitis of large intestine with perforation and abscess without bleeding: Secondary | ICD-10-CM

## 2020-11-19 DIAGNOSIS — L0291 Cutaneous abscess, unspecified: Secondary | ICD-10-CM

## 2020-11-19 DIAGNOSIS — Z5189 Encounter for other specified aftercare: Secondary | ICD-10-CM

## 2020-11-19 HISTORY — PX: IR RADIOLOGIST EVAL & MGMT: IMG5224

## 2020-11-19 MED ORDER — IOPAMIDOL (ISOVUE-300) INJECTION 61%
100.0000 mL | Freq: Once | INTRAVENOUS | Status: AC | PRN
  Administered 2020-11-19: 100 mL via INTRAVENOUS

## 2020-11-19 NOTE — Progress Notes (Signed)
Referring Physician(s): Aycock,Ngwe A  Reason for follow up: Initial outpatient follow up after 2 hepatic drain placements on 11/03/20  History of present illness: 65 year old female with history of hepatic abscesses s/p drain placements by CT guidance at [facility] on 11/19/20.  She has remained on rocephin and flagyl managed by Infectious Disease.    She reports minimal output from each drain.  She has not flushed the drains since discharge home from the hospital.  She presented to the [facility] ED this am with concerns for bleeding along one of the tubes that stopped spontaneously.    She denies fevers, chills, abdominal pain.   Past Medical History:  Diagnosis Date   CHF (congestive heart failure) (HCC)    COPD (chronic obstructive pulmonary disease) (HCC)    Diabetes mellitus, type 2 (HCC)    GERD (gastroesophageal reflux disease)    Heart disease    Hypertension    Joint pain    Polycythemia vera ([place])    Tobacco abuse     No past surgical history on file.  Allergies: Patient has no known allergies.  Medications: Prior to Admission medications   Medication Sig Start Date End Date Taking? Authorizing Provider  amiodarone (PACERONE) 200 MG tablet Take 1 tablet (200 mg total) by mouth daily. 11/08/20   [name], MD  apixaban (ELIQUIS) 5 MG TABS tablet Take 1 tablet (5 mg total) by mouth 2 (two) times daily. 11/07/20   [name], MD  aspirin 325 MG EC tablet Take 325 mg by mouth daily.    [provider]  b complex vitamins tablet Take 1 tablet by mouth every other day.     [provider]  cefTRIAXone (ROCEPHIN) IVPB Inject 2 g into the vein daily for 25 days. Indication:  streptococcus bacteremia with liver/diverticular abscess First Dose: Yes Last Day of Therapy:  12/03/2020 Labs - Once weekly:  CBC/D and CMP Please pull PIC at completion of IV antibiotics Fax weekly labs to [phone] Method of administration: IV Push Method of administration may  be changed at the discretion of home infusion pharmacist based upon assessment of the patient and/or caregiver's ability to self-administer the medication ordered. 11/08/20 12/03/20  [name], MD  furosemide (LASIX) 40 MG tablet Take 1 tablet by mouth daily. 11/19/14   [provider]  gabapentin (NEURONTIN) 100 MG capsule Take 100-200 mg by mouth 3 (three) times daily. 10/20/20   [provider]  lactobacillus acidophilus & bulgar (LACTINEX) chewable tablet Chew 1 tablet by mouth 3 (three) times daily with meals. 05/03/15   Bettey Costa, MD  metFORMIN (GLUCOPHAGE) 500 MG tablet Take 1 tablet by mouth daily with breakfast. 11/19/14   [provider]  Multiple Vitamins-Minerals (MULTIVITAL PO) Take 1 tablet by mouth daily.    [provider]  pantoprazole (PROTONIX) 40 MG tablet Take 1 tablet by mouth daily. 11/19/14   [provider]  potassium chloride SA (K-DUR,KLOR-CON) 20 MEQ tablet Take 1 tablet by mouth daily. 11/20/14   [provider]  sacubitril-valsartan (ENTRESTO) 24-26 MG Take 1 tablet by mouth 2 (two) times daily. 11/07/20   [name], MD  spironolactone (ALDACTONE) 25 MG tablet Take 1 tablet by mouth daily. 11/19/14   [provider]     Family History  Problem Relation Age of Onset   Breast cancer Sister        sister #1 age 43   Kidney cancer Sister  sister #1 age -14's   Breast cancer Sister        sister #2 age 81's   Kidney disease Sister        sister #2 age 98's   Diabetes Mother    Heart disease Mother        + BYPASS SURGERY   Deep vein thrombosis Mother    Arthritis Mother    Diabetes Sister    Heart disease Sister    Hypertension Brother    Heart attack Father     Social History   Socioeconomic History   Marital status: Divorced    Spouse name: Not on file   Number of children: Not on file   Years of education: Not on file   Highest education level: Not on file  Occupational History    Not on file  Tobacco Use   Smoking status: Every Day    Packs/day: 0.25    Years: 40.00    Pack years: 10.00    Types: Cigarettes   Smokeless tobacco: Never   Tobacco comments:    smoking since teens  Substance and Sexual Activity   Alcohol use: No    Alcohol/week: 0.0 standard drinks   Drug use: No   Sexual activity: Not Currently  Other Topics Concern   Not on file  Social History Narrative   Not on file   Social Determinants of Health   Financial Resource Strain: Not on file  Food Insecurity: Not on file  Transportation Needs: Not on file  Physical Activity: Not on file  Stress: Not on file  Social Connections: Not on file     Vital Signs: There were no vitals taken for this visit.  Physical Exam Constitutional:      General: She is not in acute distress. HENT:     Head: Normocephalic.     Mouth/Throat:     Mouth: Mucous membranes are moist.  Pulmonary:     Effort: Pulmonary effort is normal.  Abdominal:     General: There is no distension.     Tenderness: There is no abdominal tenderness.     Comments: Drains #1 and #2 in RUQ with scant serosanguinous output in bags.  Leakage along skin entry site upon flushing each drain which is met with resistance.  Skin:    General: Skin is dry.  Neurological:     Mental Status: She is alert.    Imaging: CT AP 11/19/20 Indwelling drains sub-optimally positioned.  Largest remaining fluid collection inferior to inferior-most drain, measuring up to 6.0 x 2.3 cm in greatest axial dimensions.    Labs:  CBC: Recent Labs    11/03/20 1804 11/04/20 0614 11/04/20 1425 11/05/20 0413 11/06/20 0420  WBC 13.0* 26.9*  --  18.7* 16.3*  HGB 15.1* 12.6 13.2 13.2 13.6  HCT 41.2 34.5*  --  36.5 37.2  PLT 189 167  --  200 226    COAGS: Recent Labs    11/03/20 0619  INR 1.3*    BMP: Recent Labs    11/03/20 0619 11/03/20 1816 11/04/20 0614 11/05/20 0413 11/06/20 0420  NA 137  --  134* 137 137  K 3.2* 3.6  3.4* 3.4* 4.2  CL 102  --  101 102 104  CO2 24  --  '26 27 25  ' GLUCOSE 111*  --  152* 89 103*  BUN 17  --  '18 16 11  ' CALCIUM 8.4*  --  7.7* 8.0* 8.3*  CREATININE 0.94  --  0.80 0.61 0.73  GFRNONAA >60  --  >60 >60 >60    LIVER FUNCTION TESTS: Recent Labs    11/02/20 1219 11/03/20 0619 11/04/20 0614  BILITOT 2.9* 3.2* 3.3*  AST 64* 59* 66*  ALT 77* 65* 61*  ALKPHOS 135* 131* 106  PROT 8.0 7.1 5.3*  ALBUMIN 3.1* 2.6* 2.1*    Assessment and Plan:  65 year old female with history of hepatic abscesses status post hepatic abscess drain placement on 11/03/20.  CT today demonstrates persistent abscesses with sub-optimal positions of indwelling drains.  The drains are also at least partially clogged without any significant reported output.  Clinically she remains free of septic symptoms and remains on antibiotics.  She will require CT guided drain repositioning/possible new drain placement with moderate sedation.  She prefers to be taken care of at [facility]  We will arrange to have this scheduled ASAP on an outpatient basis.   Electronically Signed: Suzette Battiest 11/19/2020, 12:46 PM   I spent a total of 25 Minutes in face to face in clinical consultation, greater than 50% of which was counseling/coordinating care for hepatic abscesses.

## 2020-11-19 NOTE — ED Provider Notes (Signed)
[facility] Emergency Department Provider Note   ____________________________________________   Event Date/Time   First MD Initiated Contact with Patient 11/19/20 [phone]     (approximate)  I have reviewed the triage vital signs and the nursing notes.   HISTORY  Chief Complaint Wound Check   HPI Stacy Cox is a 65 y.o. female who presents for a check of her right upper quadrant intra-abdominal drains.  Patient states that she was being checked by wound care yesterday when they found some dried blood around the bottom drain.  Patient states that she has had some slight trauma to these drains in the last couple days and they have been hurting more than usual.  Patient denies any abnormal drainage coming from the's drainage tubes as well as denies any abnormal drainage around the outside.  Patient does state that the drainage is normal and that she is following up later today with her interventional radiologist for possible removal.          Past Medical History:  Diagnosis Date   CHF (congestive heart failure) ([place])    COPD (chronic obstructive pulmonary disease) ([place])    Diabetes mellitus, type 2 ([place])    GERD (gastroesophageal reflux disease)    Heart disease    Hypertension    Joint pain    Polycythemia vera ([place])    Tobacco abuse     Patient Active Problem List   Diagnosis Date Noted   Chronic systolic CHF (congestive heart failure) ([place]) 11/06/2020   Obesity, Class III, BMI 40-49.9 (morbid obesity) ([place]) 11/06/2020   Drop in hemoglobin    AF (paroxysmal atrial fibrillation) (HCC)    Essential hypertension    Chronic diastolic CHF (congestive heart failure) (HCC)    Lactic acidosis    Hypokalemia    Acute respiratory failure (HCC)    Liver abscess 11/02/2020   COPD (chronic obstructive pulmonary disease) (HCC)    CHF (congestive heart failure) ([place])    Acute diverticulitis 05/02/2015   Sepsis secondary to acute diverticulitis  05/02/2015   Tobacco abuse 05/02/2015   Type 2 diabetes mellitus  05/02/2015   Polycythemia vera ([place]) 12/13/2014    No past surgical history on file.  Prior to Admission medications   Medication Sig Start Date End Date Taking? Authorizing Provider  amiodarone (PACERONE) 200 MG tablet Take 1 tablet (200 mg total) by mouth daily. 11/08/20   [name], MD  apixaban (ELIQUIS) 5 MG TABS tablet Take 1 tablet (5 mg total) by mouth 2 (two) times daily. 11/07/20   [name], MD  aspirin 325 MG EC tablet Take 325 mg by mouth daily.    [provider]  b complex vitamins tablet Take 1 tablet by mouth every other day.     [provider]  cefTRIAXone (ROCEPHIN) IVPB Inject 2 g into the vein daily for 25 days. Indication:  streptococcus bacteremia with liver/diverticular abscess First Dose: Yes Last Day of Therapy:  12/03/2020 Labs - Once weekly:  CBC/D and CMP Please pull PIC at completion of IV antibiotics Fax weekly labs to [phone] Method of administration: IV Push Method of administration may be changed at the discretion of home infusion pharmacist based upon assessment of the patient and/or caregiver's ability to self-administer the medication ordered. 11/08/20 12/03/20  [name], MD  furosemide (LASIX) 40 MG tablet Take 1 tablet by mouth daily. 11/19/14   [provider]  gabapentin (NEURONTIN) 100 MG capsule Take 100-200 mg by mouth 3 (  three) times daily. 10/20/20   [provider]  lactobacillus acidophilus & bulgar (LACTINEX) chewable tablet Chew 1 tablet by mouth 3 (three) times daily with meals. 05/03/15   Bettey Costa, MD  metFORMIN (GLUCOPHAGE) 500 MG tablet Take 1 tablet by mouth daily with breakfast. 11/19/14   [provider]  Multiple Vitamins-Minerals (MULTIVITAL PO) Take 1 tablet by mouth daily.    [provider]  pantoprazole (PROTONIX) 40 MG tablet Take 1 tablet by mouth daily. 11/19/14   [provider]   potassium chloride SA (K-DUR,KLOR-CON) 20 MEQ tablet Take 1 tablet by mouth daily. 11/20/14   [provider]  sacubitril-valsartan (ENTRESTO) 24-26 MG Take 1 tablet by mouth 2 (two) times daily. 11/07/20   [name], MD  spironolactone (ALDACTONE) 25 MG tablet Take 1 tablet by mouth daily. 11/19/14   [provider]    Allergies Patient has no known allergies.  Family History  Problem Relation Age of Onset   Breast cancer Sister        sister #1 age 87   Kidney cancer Sister        sister #1 age -63's   Breast cancer Sister        sister #2 age 34's   Kidney disease Sister        sister #2 age 63's   Diabetes Mother    Heart disease Mother        + BYPASS SURGERY   Deep vein thrombosis Mother    Arthritis Mother    Diabetes Sister    Heart disease Sister    Hypertension Brother    Heart attack Father     Social History Social History   Tobacco Use   Smoking status: Every Day    Packs/day: 0.25    Years: 40.00    Pack years: 10.00    Types: Cigarettes   Smokeless tobacco: Never   Tobacco comments:    smoking since teens  Substance Use Topics   Alcohol use: No    Alcohol/week: 0.0 standard drinks   Drug use: No    Review of Systems Constitutional: No fever/chills Eyes: No visual changes. ENT: No sore throat. Cardiovascular: Denies chest pain. Respiratory: Denies shortness of breath. Gastrointestinal: No abdominal pain.  No nausea, no vomiting.  No diarrhea. Genitourinary: Negative for dysuria. Musculoskeletal: Negative for acute arthralgias Skin: Negative for rash. Neurological: Negative for headaches, weakness/numbness/paresthesias in any extremity Psychiatric: Negative for suicidal ideation/homicidal ideation   ____________________________________________   PHYSICAL EXAM:  VITAL SIGNS: ED Triage Vitals  Enc Vitals Group     BP 11/18/20 2232 101/73     Pulse Rate 11/18/20 2232 (!) 101     Resp 11/18/20 2232 18     Temp  11/18/20 2232 98.3 F (36.8 C)     Temp Source 11/18/20 2232 Oral     SpO2 11/18/20 2232 100 %     Weight 11/18/20 2233 234 lb (106.1 kg)     Height 11/18/20 2233 5\' 8"  (1.727 m)     Head Circumference --      Peak Flow --      Pain Score 11/18/20 2233 5     Pain Loc --      Pain Edu? --      Excl. in [place]? --    Constitutional: Alert and oriented. Well appearing and in no acute distress. Eyes: Conjunctivae are normal. PERRL. Head: Atraumatic. Nose: No congestion/rhinnorhea. Mouth/Throat: Mucous membranes are moist. Neck: No stridor Cardiovascular:  Grossly normal heart sounds.  Good peripheral circulation. Respiratory: Normal respiratory effort.  No retractions. Gastrointestinal: Soft and nontender. No distention.  2 12/13 French drains present in the right upper quadrant with a small amount of dried blood around the bottom drain.  Drainage fluid in these lines appears appropriate without any bleeding or purulent drainage. Musculoskeletal: No obvious deformities Neurologic:  Normal speech and language. No gross focal neurologic deficits are appreciated. Skin:  Skin is warm and dry. No rash noted. Psychiatric: Mood and affect are normal. Speech and behavior are normal.  ____________________________________________   LABS (all labs ordered are listed, but only abnormal results are displayed)  Labs Reviewed - No data to display ____________________________________________  PROCEDURES  Procedure(s) performed (including Critical Care):  Procedures   ____________________________________________   INITIAL IMPRESSION / ASSESSMENT AND PLAN / ED COURSE  As part of my medical decision making, I reviewed the following data within the electronic medical record, if available:  Nursing notes reviewed and incorporated, Labs reviewed, EKG interpreted, Old chart reviewed, Radiograph reviewed and Notes from prior ED visits reviewed and incorporated        Patient is a 65 year old  female who presents for wound check of of her to right upper quadrant intraperitoneal drains.  I have low low suspicion for dislodgment of the drains, cellulitis, recurrent infection, or sepsis.  Drains appear intact on exam with sutures intact as well as holders without any damage.  There is a small amount of dried blood around the bottom drain without any evidence of of laceration or trauma.  Patient denies any active bleeding and there is no active bleeding at this time.  Patient has follow-up with her interventional radiologist in approximately 4 hours and she was encouraged to keep this appointment as she may be getting these drains taken out today.  Patient agrees with plan and all questions were answered prior to discharge  Dispo: Discharge home with interventional radiology follow-up later today      ____________________________________________   FINAL CLINICAL IMPRESSION(S) / ED DIAGNOSES  Final diagnoses:  Encounter for wound re-check     ED Discharge Orders     None        Note:  This document was prepared using Dragon voice recognition software and may include unintentional dictation errors.    Naaman Plummer, MD 11/19/20 [phone]

## 2020-11-20 ENCOUNTER — Other Ambulatory Visit: Payer: Self-pay | Admitting: Physician Assistant

## 2020-11-20 DIAGNOSIS — K750 Abscess of liver: Secondary | ICD-10-CM

## 2020-11-20 NOTE — Progress Notes (Signed)
Patient placed on schedule for Liver abscess drain exchange for 11/25/2020, called and spoke with patient and daughter over phone with pre procedure instructions given. Is on oral antibiotics at present time with no fever, at been in ER at [facility] yesterday with drains evaluated. Consulted with Dr Kathlene Cote today, deemed necessary to hold Eliquis per protocol and place on schedule. Have reached out to Dr Kahn/cardiolgist to gett permission to have patient hold Eliquis with LD 10/22, and do procedure 10/25. Instructed to be here @ 0900, NPO after MN prior to procedure as well as driver post procedure/recovery/discharge. Holding am metformin day of procedure. Stated understanding.

## 2020-11-24 ENCOUNTER — Other Ambulatory Visit: Payer: Self-pay | Admitting: Student

## 2020-11-24 ENCOUNTER — Telehealth: Payer: Self-pay

## 2020-11-24 NOTE — Telephone Encounter (Signed)
Requested labs from [facility]. Verified patient will come to appt on 11/27/2020.

## 2020-11-25 ENCOUNTER — Other Ambulatory Visit: Payer: Self-pay | Admitting: Physician Assistant

## 2020-11-25 ENCOUNTER — Ambulatory Visit
Admission: RE | Admit: 2020-11-25 | Discharge: 2020-11-25 | Disposition: A | Discharge status: Home / Self Care | Payer: Medicare HMO | Source: Ambulatory Visit | Attending: Physician Assistant | Admitting: Physician Assistant

## 2020-11-25 ENCOUNTER — Other Ambulatory Visit: Payer: Self-pay

## 2020-11-25 DIAGNOSIS — Z4803 Encounter for change or removal of drains: Secondary | ICD-10-CM | POA: Insufficient documentation

## 2020-11-25 DIAGNOSIS — K750 Abscess of liver: Secondary | ICD-10-CM | POA: Insufficient documentation

## 2020-11-25 HISTORY — PX: IR CATHETER TUBE CHANGE: IMG717

## 2020-11-25 LAB — CBC
HCT: 38.6 % (ref 36.0–46.0)
Hemoglobin: 13.6 g/dL (ref 12.0–15.0)
MCH: 33.2 pg (ref 26.0–34.0)
MCHC: 35.2 g/dL (ref 30.0–36.0)
MCV: 94.1 fL (ref 80.0–100.0)
Platelets: 371 10*3/uL (ref 150–400)
RBC: 4.1 MIL/uL (ref 3.87–5.11)
RDW: 15.8 % — ABNORMAL HIGH (ref 11.5–15.5)
WBC: 8.4 10*3/uL (ref 4.0–10.5)
nRBC: 0 % (ref 0.0–0.2)

## 2020-11-25 LAB — PROTIME-INR
INR: 1.1 (ref 0.8–1.2)
Prothrombin Time: 14.6 seconds (ref 11.4–15.2)

## 2020-11-25 LAB — GLUCOSE, CAPILLARY: Glucose-Capillary: 116 mg/dL — ABNORMAL HIGH (ref 70–99)

## 2020-11-25 MED ORDER — LIDOCAINE HCL 1 % IJ SOLN
INTRAMUSCULAR | Status: AC
  Administered 2020-11-25: 3 mL
  Filled 2020-11-25: qty 20

## 2020-11-25 MED ORDER — SODIUM CHLORIDE 0.9 % IV SOLN: INTRAVENOUS | Status: DC

## 2020-11-25 MED ORDER — MIDAZOLAM HCL 2 MG/2ML IJ SOLN
INTRAMUSCULAR | Status: AC
  Filled 2020-11-25: qty 2

## 2020-11-25 MED ORDER — HEPARIN SOD (PORK) LOCK FLUSH 100 UNIT/ML IV SOLN
INTRAVENOUS | Status: AC
  Filled 2020-11-25: qty 5

## 2020-11-25 MED ORDER — IOHEXOL 350 MG/ML SOLN
10.0000 mL | Freq: Once | INTRAVENOUS | Status: AC | PRN
  Administered 2020-11-25: 10 mL
  Filled 2020-11-25: qty 10

## 2020-11-25 MED ORDER — FENTANYL CITRATE (PF) 100 MCG/2ML IJ SOLN
INTRAMUSCULAR | Status: AC | PRN
  Administered 2020-11-25 (×2): 50 ug via INTRAVENOUS

## 2020-11-25 MED ORDER — MIDAZOLAM HCL 2 MG/2ML IJ SOLN
INTRAMUSCULAR | Status: AC | PRN
  Administered 2020-11-25 (×2): 1 mg via INTRAVENOUS

## 2020-11-25 MED ORDER — FENTANYL CITRATE (PF) 100 MCG/2ML IJ SOLN
INTRAMUSCULAR | Status: AC
  Filled 2020-11-25: qty 2

## 2020-11-25 MED ORDER — SODIUM CHLORIDE 0.9% FLUSH
5.0000 mL | Freq: Three times a day (TID) | INTRAVENOUS | Status: DC
  Filled 2020-11-25: qty 6

## 2020-11-25 NOTE — H&P (Signed)
Interventional Radiology - Pre-procedure H&P    Referring Provider: Joaquim Nam, PA*  Planned Procedure: drain exchange/replacement     History of Present Illness  Stacy Cox is a 65 y.o. female with a history of diverticulitis and liver abscesses s/p percutaneous drainage x2 seen today for drain evaluation. Clinic visit and appointment last week with CT showed persistent abscesses with likely suboptimal positioning of the drains. They continue to have low output and are likely at least partially clogged.   Reports being in otherwise in usual state of health. ROS negative for recent fever, chest pain, SOB.     Additional Past Medical History Past Medical History:  Diagnosis Date   CHF (congestive heart failure) (HCC)    COPD (chronic obstructive pulmonary disease) (HCC)    Diabetes mellitus, type 2 (HCC)    GERD (gastroesophageal reflux disease)    Heart disease    Hypertension    Joint pain    Polycythemia vera ([place])    Tobacco abuse     Surgical History  Past Surgical History:  Procedure Laterality Date   IR RADIOLOGIST EVAL & MGMT  11/19/2020     Medications  I have reviewed the current medication list. Refer to chart for details. Current Outpatient Medications  Medication Instructions   amiodarone (PACERONE) 200 mg, Oral, Daily   apixaban (ELIQUIS) 5 mg, Oral, 2 times daily   aspirin 325 mg, Oral, Daily   b complex vitamins tablet 1 tablet, Oral, Every other day   cefTRIAXone (ROCEPHIN) IVPB 2 g, Intravenous, Every 24 hours, Indication:  streptococcus bacteremia with liver/diverticular abscess<BR>First Dose: Yes<BR>Last Day of Therapy:  12/03/2020<BR>Labs - Once weekly:  CBC/D and CMP<BR>Please pull PIC at completion of IV antibiotics<BR>Fax weekly labs to [phone]<BR>Method of administration: IV Push<BR>Method of administration may be changed at the discretion of home infusion pharmacist based upon assessment of the patient and/or caregiver's ability  to self-administer the medication ordered.   furosemide (LASIX) 40 MG tablet 1 tablet, Oral, Daily   gabapentin (NEURONTIN) 100-200 mg, Oral, 3 times daily   lactobacillus acidophilus & bulgar (LACTINEX) chewable tablet 1 tablet, Oral, 3 times daily with meals   metFORMIN (GLUCOPHAGE) 500 MG tablet 1 tablet, Oral, Daily with breakfast   Multiple Vitamins-Minerals (MULTIVITAL PO) 1 tablet, Oral, Daily   pantoprazole (PROTONIX) 40 MG tablet 1 tablet, Oral, Daily   potassium chloride SA (K-DUR,KLOR-CON) 20 MEQ tablet 1 tablet, Oral, Daily   sacubitril-valsartan (ENTRESTO) 24-26 MG 1 tablet, Oral, 2 times daily   spironolactone (ALDACTONE) 25 MG tablet 1 tablet, Oral, Daily     Allergies No Known Allergies Does patient have contrast allergy: No     Physical Exam Current Vitals   ( )                    There is no height or weight on file to calculate BMI.  General: Alert and answers questions appropriately. No apparent distress. HEENT: Normocephalic, atraumatic. Conjunctivae normal without scleral icterus. Mallampati score: II (hard and soft palate, upper portion of tonsils anduvula visible) Cardiac: Regular rate. No dependent edema. Pulmonary: Normal work of breathing. On room air. Abdominal: Soft without distension. Extremities: Normally-formed, well perfused.    Pertinent Lab Results Labs: CBC: WBC/Hgb/Hct/Plts:  8.4/13.6/38.6/371 (10/25 0940)  BMP:   Coagulation: PT/INR/PTT:  --/1.1/-- (10/25 0940)  CBC Trends: Recent Labs    11/25/20 0940  WBC 8.4  HGB 13.6  HCT 38.6  PLT 371     Creatinine Trend: No  results for input(s): CREATININE in the last 72 hours.   Relevant and/or Recent Imaging: CT last week, reviewed    Assessment & Plan Stacy Cox is a 65 y.o. female with a history of diverticulitis and liver abscesses s/p percutaneous drainage x2 seen today for drain evaluation.   Plan today for drain exchange and repositioning today under fluoroscopy.  Plan to reposition superior drain more medially, and inferior drain more inferior. Reserve need for new drain placement by CT.   Patient is appropriate candidate for drain exchange and possible new placement. Risks and benefits discussed, including but not limited to procedure-specific risks of liver injury, and patient is agreeable to proceed.    Procedure Checklist:  Consent obtained: Risks of the procedure as well as the alternatives and risks of each were explained to the patient and/or caregiver.  Consent for the procedure was obtained and is signed in the bedside chart Consent obtained from: The patient Patient is appropriate candidate for sedation Yes ASA Classification: ASA 2 - Patient with mild systemic disease with no functional limitations NPO status: 0000  Code status:   Code Status: Prior Pre-procedural prep necessary: n/a     I spent a total of 15 Minutes in face-to-face in clinical consultation, greater than 50% of which was spent on medical decision-making and counseling/coordinating care for liver abscess.     Albin Felling, MD  Vascular and Interventional Radiology 11/25/2020 10:31 AM

## 2020-11-25 NOTE — Progress Notes (Signed)
Patient and daughter given instructions to flush each drain with 71ml ns twice a day as per orders noted. Change dressing every 2-3 days or when soiled. Given supplies for start of therapy, aware that patient will return in 2-3 weeks for outpatient CT of abdomen to re evaluate status of drains. For possible discontinue.

## 2020-11-25 NOTE — Procedures (Signed)
Interventional Radiology Procedure Note  Date of Procedure: 11/25/2020  Procedure: Drain exchange and reposition   Findings:  1. Successful exchange and reposition of superior hepatic abscess drain into more medial fluid cavity  2. Successful exchange and reposition of inferior hepatic abscess drain into more inferior fluid cavity     Complications: No immediate complications noted.   Estimated Blood Loss: minimal  Follow-up and Recommendations: 1. Follow up in IR clinic with CT in 10-14 days   Albin Felling, MD  Vascular & Interventional Radiology  11/25/2020 11:28 AM

## 2020-11-25 NOTE — Progress Notes (Signed)
Patient clinically stable post X2 abscess drain exchange, tolerated well, vitals stable pre and post procedure. Denies complaints at this time. Received Versed 2 mg along with Fentanyl 100 mcg IV for procedure.

## 2020-11-26 ENCOUNTER — Other Ambulatory Visit: Payer: Self-pay | Admitting: Surgery

## 2020-11-26 DIAGNOSIS — K750 Abscess of liver: Secondary | ICD-10-CM

## 2020-11-26 DIAGNOSIS — K5720 Diverticulitis of large intestine with perforation and abscess without bleeding: Secondary | ICD-10-CM

## 2020-11-27 ENCOUNTER — Ambulatory Visit: Payer: Medicare HMO | Attending: Infectious Diseases | Admitting: Infectious Diseases

## 2020-11-27 ENCOUNTER — Other Ambulatory Visit: Payer: Self-pay

## 2020-11-27 VITALS — BP 117/75 | HR 119 | Resp 16 | Ht 68.0 in | Wt 238.2 lb

## 2020-11-27 DIAGNOSIS — Z833 Family history of diabetes mellitus: Secondary | ICD-10-CM | POA: Insufficient documentation

## 2020-11-27 DIAGNOSIS — Z79899 Other long term (current) drug therapy: Secondary | ICD-10-CM | POA: Insufficient documentation

## 2020-11-27 DIAGNOSIS — K5792 Diverticulitis of intestine, part unspecified, without perforation or abscess without bleeding: Secondary | ICD-10-CM | POA: Diagnosis not present

## 2020-11-27 DIAGNOSIS — Z7984 Long term (current) use of oral hypoglycemic drugs: Secondary | ICD-10-CM | POA: Diagnosis not present

## 2020-11-27 DIAGNOSIS — B9689 Other specified bacterial agents as the cause of diseases classified elsewhere: Secondary | ICD-10-CM

## 2020-11-27 DIAGNOSIS — I110 Hypertensive heart disease with heart failure: Secondary | ICD-10-CM | POA: Diagnosis not present

## 2020-11-27 DIAGNOSIS — Z7901 Long term (current) use of anticoagulants: Secondary | ICD-10-CM | POA: Diagnosis not present

## 2020-11-27 DIAGNOSIS — R7881 Bacteremia: Secondary | ICD-10-CM | POA: Diagnosis not present

## 2020-11-27 DIAGNOSIS — Z7982 Long term (current) use of aspirin: Secondary | ICD-10-CM | POA: Diagnosis not present

## 2020-11-27 DIAGNOSIS — K5720 Diverticulitis of large intestine with perforation and abscess without bleeding: Secondary | ICD-10-CM | POA: Diagnosis not present

## 2020-11-27 DIAGNOSIS — K750 Abscess of liver: Secondary | ICD-10-CM | POA: Diagnosis not present

## 2020-11-27 DIAGNOSIS — F17210 Nicotine dependence, cigarettes, uncomplicated: Secondary | ICD-10-CM | POA: Insufficient documentation

## 2020-11-27 DIAGNOSIS — B955 Unspecified streptococcus as the cause of diseases classified elsewhere: Secondary | ICD-10-CM

## 2020-11-27 DIAGNOSIS — D45 Polycythemia vera: Secondary | ICD-10-CM | POA: Diagnosis not present

## 2020-11-27 DIAGNOSIS — E119 Type 2 diabetes mellitus without complications: Secondary | ICD-10-CM | POA: Insufficient documentation

## 2020-11-27 DIAGNOSIS — Z8249 Family history of ischemic heart disease and other diseases of the circulatory system: Secondary | ICD-10-CM | POA: Diagnosis not present

## 2020-11-27 MED ORDER — METRONIDAZOLE 500 MG PO TABS: 500.0000 mg | ORAL_TABLET | Freq: Two times a day (BID) | ORAL | 0 refills | Status: DC

## 2020-11-27 NOTE — Patient Instructions (Addendum)
You are here for follow up of liver abscesses- you had drains changed on 10/25- you will continue IV ceftriaxone and Po flagyl until 11/8 when you have the repeat CT scan and possible drain removal. After you complete IV antibiotic and PO metronidazole on 11/8, will give Amoxicillin+ clavulunate ( augmentin) 875mg  twice a day for 2 weeks  Follow up 4 weeks

## 2020-11-27 NOTE — Progress Notes (Signed)
NAME: Stacy Cox  DOB: 1955-12-25  MRN: [id number]  Date/Time: 11/27/2020 12:04 PM  R Subjective:   ? Stacy Cox is a 65 y.o. with a history of DM, HTN, CHF Is here for follow up after recent hospitalization between November 02, 2020 until 10./7022 for liver abscesses secondary to diverticulitis.  She had streptococcus intermedius in blood culture and abscess culture. She had 2 drains placed in the  abscesses cavity and was sent home on IV ceftriaxone and p.o. Flagyl.for 4 weeks to complete on 12/03/20 She had problem with the drains as it was not draining and came back to ED and then saw IR on 11/25/20- A repeat Ct showed abscesses to be smaller in size but significant lower abscess cavity .the lower abscess drain not in the right position and it was repositioned- the upper drain had clotted and so it was exchanged. She is here today with her daughter- says the upper drain not draining anymore- when flushing the drain the saline discharges out of the insertion site- She called IR and they asked her not to flush the top drain. She has some pain at the drain insertion site. No fever, chills, diarrhea, nausea, vomiting , itching or rash Discussed lab results with her  other than blood sugar high at 191 , rest okay  Past Medical History:  Diagnosis Date   CHF (congestive heart failure) (HCC)    COPD (chronic obstructive pulmonary disease) (HCC)    Diabetes mellitus, type 2 (HCC)    GERD (gastroesophageal reflux disease)    Heart disease    Hypertension    Joint pain    Polycythemia vera ([place])    Tobacco abuse     Past Surgical History:  Procedure Laterality Date   IR CATHETER TUBE CHANGE  11/25/2020   IR CATHETER TUBE CHANGE  11/25/2020   IR RADIOLOGIST EVAL & MGMT  11/19/2020    Social History   Socioeconomic History   Marital status: Divorced    Spouse name: Not on file   Number of children: Not on file   Years of education: Not on file   Highest education level: Not on file   Occupational History   Not on file  Tobacco Use   Smoking status: Every Day    Packs/day: 0.25    Years: 40.00    Pack years: 10.00    Types: Cigarettes   Smokeless tobacco: Never   Tobacco comments:    smoking since teens  Substance and Sexual Activity   Alcohol use: No    Alcohol/week: 0.0 standard drinks   Drug use: No   Sexual activity: Not Currently  Other Topics Concern   Not on file  Social History Narrative   Not on file   Social Determinants of Health   Financial Resource Strain: Not on file  Food Insecurity: Not on file  Transportation Needs: Not on file  Physical Activity: Not on file  Stress: Not on file  Social Connections: Not on file  Intimate Partner Violence: Not on file    Family History  Problem Relation Age of Onset   Breast cancer Sister        sister #1 age 70   Kidney cancer Sister        sister #1 age -47's   Breast cancer Sister        sister #2 age 39's   Kidney disease Sister        sister #2 age 63's   Diabetes Mother  Heart disease Mother        + BYPASS SURGERY   Deep vein thrombosis Mother    Arthritis Mother    Diabetes Sister    Heart disease Sister    Hypertension Brother    Heart attack Father    No Known Allergies I? Current Outpatient Medications  Medication Sig Dispense Refill   amiodarone (PACERONE) 200 MG tablet Take 1 tablet (200 mg total) by mouth daily. 30 tablet 0   apixaban (ELIQUIS) 5 MG TABS tablet Take 1 tablet (5 mg total) by mouth 2 (two) times daily. 60 tablet 0   aspirin 325 MG EC tablet Take 325 mg by mouth daily.     b complex vitamins tablet Take 1 tablet by mouth every other day.      cefTRIAXone (ROCEPHIN) IVPB Inject 2 g into the vein daily for 25 days. Indication:  streptococcus bacteremia with liver/diverticular abscess First Dose: Yes Last Day of Therapy:  12/03/2020 Labs - Once weekly:  CBC/D and CMP Please pull PIC at completion of IV antibiotics Fax weekly labs to [phone] Method  of administration: IV Push Method of administration may be changed at the discretion of home infusion pharmacist based upon assessment of the patient and/or caregiver's ability to self-administer the medication ordered. 26 Units 0   furosemide (LASIX) 40 MG tablet Take 1 tablet by mouth daily.     gabapentin (NEURONTIN) 100 MG capsule Take 100-200 mg by mouth 3 (three) times daily.     lactobacillus acidophilus & bulgar (LACTINEX) chewable tablet Chew 1 tablet by mouth 3 (three) times daily with meals. 60 tablet 0   metFORMIN (GLUCOPHAGE) 500 MG tablet Take 1 tablet by mouth daily with breakfast.     Multiple Vitamins-Minerals (MULTIVITAL PO) Take 1 tablet by mouth daily.     pantoprazole (PROTONIX) 40 MG tablet Take 1 tablet by mouth daily.     potassium chloride SA (K-DUR,KLOR-CON) 20 MEQ tablet Take 1 tablet by mouth daily.     sacubitril-valsartan (ENTRESTO) 24-26 MG Take 1 tablet by mouth 2 (two) times daily. 60 tablet 0   spironolactone (ALDACTONE) 25 MG tablet Take 1 tablet by mouth daily.     No current facility-administered medications for this visit.     Abtx:  Anti-infectives (From admission, onward)    None       REVIEW OF SYSTEMS:  Const: negative fever, negative chills, negative weight loss Eyes: negative diplopia or visual changes, negative eye pain ENT: negative coryza, negative sore throat Resp: negative cough, hemoptysis, dyspnea Cards: negative for chest pain, palpitations, lower extremity edema GU: negative for frequency, dysuria and hematuria GI: rt sided pain at drain site, no diarrhea, bleeding, constipation Skin: negative for rash and pruritus Heme: negative for easy bruising and gum/nose bleeding MS: negative for myalgias, arthralgias, back pain and muscle weakness Neurolo:negative for headaches, dizziness, vertigo, memory problems  Psych: negative for feelings of anxiety, depression  Endocrine diabetes Allergy/Immunology- negative for any medication or  food allergies ? Objective:  VITALS:  BP 117/75   Pulse (!) 119   Resp 16   Ht 5\' 8"  (1.727 m)   Wt 238 lb 3.2 oz (108 kg)   SpO2 95%   BMI 36.22 kg/m  PHYSICAL EXAM:  General: Alert, cooperative, no distress, appears stated age.  Head: Normocephalic, without obvious abnormality, atraumatic. Eyes: Conjunctivae clear, anicteric sclerae. Pupils are equal ENT Nares normal. No drainage or sinus tenderness. Lips, mucosa, and tongue normal. No Thrush Neck: Supple,  symmetrical, no adenopathy, thyroid: non tender no carotid bruit and no JVD. Back: No CVA tenderness. Lungs: Clear to auscultation bilaterally. No Wheezing or Rhonchi. No rales. Heart: Regular rate and rhythm, no murmur, rub or gallop. Abdomen: Soft, rt upper quadrant- 2 drains Extremities: rt PICC- fine atraumatic, no cyanosis. No edema. No clubbing Skin: No rashes or lesions. Or bruising Lymph: Cervical, supraclavicular normal. Neurologic: Grossly non-focal Pertinent Labs Lab Results CBC    Component Value Date/Time   WBC 8.4 11/25/2020 0940   RBC 4.10 11/25/2020 0940   HGB 13.6 11/25/2020 0940   HGB 16.3 (H) 06/25/2012 1909   HCT 38.6 11/25/2020 0940   HCT 46.7 06/25/2012 1909   PLT 371 11/25/2020 0940   PLT 249 06/25/2012 1909   MCV 94.1 11/25/2020 0940   MCV 93 06/25/2012 1909   MCH 33.2 11/25/2020 0940   MCHC 35.2 11/25/2020 0940   RDW 15.8 (H) 11/25/2020 0940   RDW 14.1 06/25/2012 1909   LYMPHSABS 3.0 11/06/2020 0420   LYMPHSABS 2.4 10/31/2011 0508   MONOABS 1.7 (H) 11/06/2020 0420   MONOABS 0.8 10/31/2011 0508   EOSABS 0.0 11/06/2020 0420   EOSABS 0.1 10/31/2011 0508   BASOSABS 0.1 11/06/2020 0420   BASOSABS 0.1 10/31/2011 0508    CMP Latest Ref Rng & Units 11/06/2020 11/05/2020 11/04/2020  Glucose 70 - 99 mg/dL 103(H) 89 152(H)  BUN 8 - 23 mg/dL 11 16 18   Creatinine 0.44 - 1.00 mg/dL 0.73 0.61 0.80  Sodium 135 - 145 mmol/L 137 137 134(L)  Potassium 3.5 - 5.1 mmol/L 4.2 3.4(L) 3.4(L)  Chloride  98 - 111 mmol/L 104 102 101  CO2 22 - 32 mmol/L 25 27 26   Calcium 8.9 - 10.3 mg/dL 8.3(L) 8.0(L) 7.7(L)  Total Protein 6.5 - 8.1 g/dL - - 5.3(L)  Total Bilirubin 0.3 - 1.2 mg/dL - - 3.3(H)  Alkaline Phos 38 - 126 U/L - - 106  AST 15 - 41 U/L - - 66(H)  ALT 0 - 44 U/L - - 61(H)      IMAGING RESULTS:  I have personally reviewed the films ? Impression/Recommendation ? ?streptococcus intermedius bacteremia Diverticulitis Small Peridiverticular abscess Liver abscesses - large X 2- rt lobe Has 2 drains And is getting IV ceftriaxone  and PO flagyl 500mg  BID- she will complete 4 weeks on 12/03/20. Will extend for a week more as the lower liver abscess is not drained completely and she had to get the drain repositioned on 11/25/20 Next appt with IR is on 11/8 when the CT scan will be repeated and drain removed if okay I will let the IR know that the top drain is not draining anymore After PICC line is removed on 11/8 she will stop flagyl as well and  she will go on augmentin 875mg  PO BID for 2 more weeks  DM- on metformin- had some food indiscretion- 11/25/20 BS was 191 She will get back on track.  Discussed the management with patient and daughter   ?follow up 4 weeks ___________________________________________________

## 2020-11-28 ENCOUNTER — Other Ambulatory Visit: Payer: Self-pay | Admitting: Surgery

## 2020-11-28 DIAGNOSIS — K750 Abscess of liver: Secondary | ICD-10-CM

## 2020-11-28 DIAGNOSIS — K5720 Diverticulitis of large intestine with perforation and abscess without bleeding: Secondary | ICD-10-CM

## 2020-12-01 ENCOUNTER — Encounter: Payer: Self-pay | Admitting: Surgery

## 2020-12-01 ENCOUNTER — Other Ambulatory Visit: Payer: Self-pay

## 2020-12-01 ENCOUNTER — Ambulatory Visit (INDEPENDENT_AMBULATORY_CARE_PROVIDER_SITE_OTHER): Payer: Medicare HMO | Admitting: Surgery

## 2020-12-01 VITALS — BP 125/83 | HR 106 | Temp 98.1°F | Ht 68.0 in | Wt 236.8 lb

## 2020-12-01 DIAGNOSIS — K5792 Diverticulitis of intestine, part unspecified, without perforation or abscess without bleeding: Secondary | ICD-10-CM | POA: Diagnosis not present

## 2020-12-01 NOTE — Patient Instructions (Addendum)
We are going to refer you to [facility] to have them do a Colonoscopy on you before surgery. They will call you to schedule this.  We will have you follow up here in 4-6 weeks.   Please call and ask to speak with a nurse if you develop questions or concerns.    Diverticulitis Diverticulitis is when small pouches in your colon (large intestine) get infected or swollen. This causes pain in the belly (abdomen) and watery poop (diarrhea). These pouches are called diverticula. The pouches form in people who have a condition called diverticulosis. What are the causes? This condition may be caused by poop (stool) that gets trapped in the pouches in your colon. The poop lets germs (bacteria) grow in the pouches. This causes the infection. What increases the risk? You are more likely to get this condition if you have small pouches in your colon. The risk is higher if: You are overweight or very overweight (obese). You do not exercise enough. You drink alcohol. You smoke or use products with tobacco in them. You eat a diet that has a lot of red meat such as beef, pork, or lamb. You eat a diet that does not have enough fiber in it. You are older than 63 years of age. What are the signs or symptoms? Pain in the belly. Pain is often on the left side, but it may be in other areas. Fever and feeling cold. Feeling like you may vomit. Vomiting. Having cramps. Feeling full. Changes to how often you poop. Blood in your poop. How is this treated? Most cases are treated at home by: Taking over-the-counter pain medicines. Following a clear liquid diet. Taking antibiotic medicines. Resting. Very bad cases may need to be treated at a hospital. This may include: Not eating or drinking. Taking prescription pain medicine. Getting antibiotic medicines through an IV tube. Getting fluid and food through an IV tube. Having surgery. When you are feeling better, your doctor may tell you to  have a test to check your colon (colonoscopy). Follow these instructions at home: Medicines Take over-the-counter and prescription medicines only as told by your doctor. These include: Antibiotics. Pain medicines. Fiber pills. Probiotics. Stool softeners. If you were prescribed an antibiotic medicine, take it as told by your doctor. Do not stop taking the antibiotic even if you start to feel better. Ask your doctor if the medicine prescribed to you requires you to avoid driving or using machinery. Eating and drinking  Follow a diet as told by your doctor. When you feel better, your doctor may tell you to change your diet. You may need to eat a lot of fiber. Fiber makes it easier to poop (have a bowel movement). Foods with fiber include: Berries. Beans. Lentils. Green vegetables. Avoid eating red meat. General instructions Do not use any products that contain nicotine or tobacco, such as cigarettes, e-cigarettes, and chewing tobacco. If you need help quitting, ask your doctor. Exercise 3 or more times a week. Try to get 30 minutes each time. Exercise enough to sweat and make your heart beat faster. Keep all follow-up visits as told by your doctor. This is important. Contact a doctor if: Your pain does not get better. You are not pooping like normal. Get help right away if: Your pain gets worse. Your symptoms do not get better. Your symptoms get worse very fast. You have a fever. You vomit more than one time. You have poop that is: Bloody. Black. Tarry. Summary This condition happens  when small pouches in your colon get infected or swollen. Take medicines only as told by your doctor. Follow a diet as told by your doctor. Keep all follow-up visits as told by your doctor. This is important. This information is not intended to replace advice given to you by your health care provider. Make sure you discuss any questions you have with your health care provider. Document Revised:  10/30/2018 Document Reviewed: 10/30/2018 Elsevier Patient Education  2022 [employer].

## 2020-12-01 NOTE — Progress Notes (Signed)
Outpatient Surgical Follow Up  12/01/2020  Sultana Tierney is an 65 y.o. female.   Chief Complaint  Patient presents with   Hospitalization Follow-up    HPI: Suni Jarnagin is a 65 y.o. with a history of DM, HTN, CHF Is here for follow up after recent hospitalization for liver abscesses secondary to diverticulitis ( she also had small diverticular abscess that did not require drain.  She did have 2 drains placed by interventional radiology during hospitalization.  She had streptococcus intermedius in blood culture and abscess culture. She had 2 drains placed in the  abscesses cavity and was sent home on IV ceftriaxone and p.o. Flagyl.for 4 weeks to complete on 12/03/20 [facility] had a repeat Ct that I have personally reviewed showing persistent hepatic abscess mainly inferior drain but w improvement she did have a tube exchange of the two drains and has a pending CT next week. She endorses intermittent pain on the left side, some pain at insertion sites.   Past Medical History:  Diagnosis Date   CHF (congestive heart failure) (HCC)    COPD (chronic obstructive pulmonary disease) (HCC)    Diabetes mellitus, type 2 (HCC)    GERD (gastroesophageal reflux disease)    Heart disease    Hypertension    Joint pain    Polycythemia vera ([place])    Tobacco abuse     Past Surgical History:  Procedure Laterality Date   IR CATHETER TUBE CHANGE  11/25/2020   IR CATHETER TUBE CHANGE  11/25/2020   IR RADIOLOGIST EVAL & MGMT  11/19/2020    Family History  Problem Relation Age of Onset   Breast cancer Sister        sister #1 age 68   Kidney cancer Sister        sister #1 age -29's   Breast cancer Sister        sister #2 age 90's   Kidney disease Sister        sister #2 age 75's   Diabetes Mother    Heart disease Mother        + BYPASS SURGERY   Deep vein thrombosis Mother    Arthritis Mother    Diabetes Sister    Heart disease Sister    Hypertension Brother    Heart attack Father     Social  History:  reports that she has been smoking cigarettes. She has a 10.00 pack-year smoking history. She has never used smokeless tobacco. She reports that she does not drink alcohol and does not use drugs.  Allergies: No Known Allergies  Medications reviewed.    ROS Full ROS performed and is otherwise negative other than what is stated in HPI   BP 125/83   Pulse (!) 106   Temp 98.1 F (36.7 C)   Ht 5\' 8"  (1.727 m)   Wt 236 lb 12.8 oz (107.4 kg)   SpO2 95%   BMI 36.01 kg/m   Physical Exam Vitals and nursing note reviewed. Exam conducted with a chaperone present.  Constitutional:      General: She is not in acute distress.    Appearance: Normal appearance. She is not ill-appearing.  Pulmonary:     Effort: Pulmonary effort is normal. No respiratory distress.  Abdominal:     General: Abdomen is flat. There is no distension.     Palpations: Abdomen is soft. There is no mass.     Tenderness: There is abdominal tenderness. There is no guarding or rebound.  Hernia: No hernia is present.     Comments: Mild ttp LLQ w/o peritonitis, Two drain RUQ in place, no abd wall abscess. Drain w scant draiange.  Musculoskeletal:        General: No swelling or tenderness. Normal range of motion.     Cervical back: Normal range of motion and neck supple. No rigidity or tenderness.  Skin:    General: Skin is warm and dry.     Capillary Refill: Capillary refill takes less than 2 seconds.     Coloration: Skin is not jaundiced.  Neurological:     General: No focal deficit present.     Mental Status: She is alert and oriented to person, place, and time.  Psychiatric:        Mood and Affect: Mood normal.        Behavior: Behavior normal.        Thought Content: Thought content normal.        Judgment: Judgment normal.      Assessment/Plan: 65 year old female with prior with RN complicated diverticulitis and recent episode of liver abscesses. She has a pending interrogation of the drains  to evaluate feasibility of removal.  She will benefit from elective sigmoid colectomy to prevent any further complications.  She wishes to pursue that route.  I will see her back in a few weeks.  We will make appropriate referrals to Gi for current endoscopic evaluation.  In ideal circumstances she will benefit from robotic elective sigmoid colectomy were to progress to persistent symptoms she may need surgical intervention sooner.  Also discussed with her about the possibility of Hartman's procedure in the event that she would not respond to appropriate medical therapy  Time spent in this encounter > 40 minutes including providing extensive counseling, personally reviewing minutes in performing appropriate documentation   Caroleen Hamman, MD [facility]

## 2020-12-02 ENCOUNTER — Encounter: Payer: Self-pay | Admitting: *Deleted

## 2020-12-02 ENCOUNTER — Ambulatory Visit
Admission: RE | Admit: 2020-12-02 | Discharge: 2020-12-02 | Disposition: A | Discharge status: Home / Self Care | Payer: Medicare HMO | Source: Ambulatory Visit | Attending: Surgery | Admitting: Surgery

## 2020-12-02 DIAGNOSIS — K750 Abscess of liver: Secondary | ICD-10-CM

## 2020-12-02 DIAGNOSIS — K5720 Diverticulitis of large intestine with perforation and abscess without bleeding: Secondary | ICD-10-CM

## 2020-12-02 HISTORY — PX: IR RADIOLOGIST EVAL & MGMT: IMG5224

## 2020-12-02 NOTE — Progress Notes (Signed)
IR brief note  I saw Artice Holohan today in IR clinic for drain check. I had discussed the plan with the ID physician Dr. Delaine Lame. The superior drain has not been draining and the abscess cavity was decompressed on last exchange. Decision made to remove superior hepatic drain today, and follow up CT results next week to decide on drain removal for the lower drain.    The superior hepatic drain was removed without difficulty.    Albin Felling, MD  Vascular and Interventional Radiology 12/02/2020 10:10 AM

## 2020-12-03 ENCOUNTER — Encounter (HOSPITAL_COMMUNITY): Payer: Self-pay | Admitting: Radiology

## 2020-12-04 ENCOUNTER — Other Ambulatory Visit: Payer: Self-pay

## 2020-12-04 DIAGNOSIS — K5792 Diverticulitis of intestine, part unspecified, without perforation or abscess without bleeding: Secondary | ICD-10-CM

## 2020-12-04 DIAGNOSIS — Z1211 Encounter for screening for malignant neoplasm of colon: Secondary | ICD-10-CM

## 2020-12-04 MED ORDER — PEG 3350-KCL-NA BICARB-NACL 420 G PO SOLR: 4000.0000 mL | Freq: Once | ORAL | 0 refills | Status: AC

## 2020-12-04 NOTE — Progress Notes (Signed)
Gastroenterology Pre-Procedure Review  Request Date: 01/07/21 Requesting Physician: Dr. Bonna Gains  PATIENT REVIEW QUESTIONS: The patient responded to the following health history questions as indicated:    1. Are you having any GI issues? no 2. Do you have a personal history of Polyps? no 3. Do you have a family history of Colon Cancer or Polyps? no 4. Diabetes Mellitus? yes (Type II) 5. Joint replacements in the past 12 months?no 6. Major health problems in the past 3 months?yes (Liver abscess; diverticulitis) 7. Any artificial heart valves, MVP, or defibrillator?no    MEDICATIONS & ALLERGIES:    Patient reports the following regarding taking any anticoagulation/antiplatelet therapy:   Plavix, Coumadin, Eliquis, Xarelto, Lovenox, Pradaxa, Brilinta, or Effient? yes (Eliquis 5 mg) Aspirin? yes (325 mg)  Patient confirms/reports the following medications:  Current Outpatient Medications  Medication Sig Dispense Refill   amiodarone (PACERONE) 200 MG tablet Take 1 tablet (200 mg total) by mouth daily. 30 tablet 0   apixaban (ELIQUIS) 5 MG TABS tablet Take 1 tablet (5 mg total) by mouth 2 (two) times daily. 60 tablet 0   aspirin 325 MG EC tablet Take 325 mg by mouth daily.     b complex vitamins tablet Take 1 tablet by mouth every other day.      furosemide (LASIX) 40 MG tablet Take 1 tablet by mouth daily.     gabapentin (NEURONTIN) 100 MG capsule Take 100-200 mg by mouth 3 (three) times daily.     lactobacillus acidophilus & bulgar (LACTINEX) chewable tablet Chew 1 tablet by mouth 3 (three) times daily with meals. 60 tablet 0   metFORMIN (GLUCOPHAGE) 500 MG tablet Take 1 tablet by mouth daily with breakfast.     metroNIDAZOLE (FLAGYL) 500 MG tablet Take 1 tablet (500 mg total) by mouth 2 (two) times daily. 14 tablet 0   Multiple Vitamins-Minerals (MULTIVITAL PO) Take 1 tablet by mouth daily.     pantoprazole (PROTONIX) 40 MG tablet Take 1 tablet by mouth daily.     potassium chloride  SA (K-DUR,KLOR-CON) 20 MEQ tablet Take 1 tablet by mouth daily.     sacubitril-valsartan (ENTRESTO) 24-26 MG Take 1 tablet by mouth 2 (two) times daily. 60 tablet 0   spironolactone (ALDACTONE) 25 MG tablet Take 1 tablet by mouth daily.     No current facility-administered medications for this visit.    Patient confirms/reports the following allergies:  No Known Allergies  No orders of the defined types were placed in this encounter.   AUTHORIZATION INFORMATION Primary Insurance: 1D#: Group #:  Secondary Insurance: 1D#: Group #:  SCHEDULE INFORMATION: Date: 01/07/21 Time: Location: [facility]

## 2020-12-09 ENCOUNTER — Ambulatory Visit
Admission: RE | Admit: 2020-12-09 | Discharge: 2020-12-09 | Disposition: A | Discharge status: Home / Self Care | Payer: Medicare HMO | Source: Ambulatory Visit | Attending: Surgery | Admitting: Surgery

## 2020-12-09 ENCOUNTER — Other Ambulatory Visit: Payer: Self-pay | Admitting: Infectious Diseases

## 2020-12-09 ENCOUNTER — Other Ambulatory Visit: Payer: Self-pay

## 2020-12-09 ENCOUNTER — Telehealth: Payer: Self-pay

## 2020-12-09 ENCOUNTER — Encounter: Payer: Self-pay | Admitting: *Deleted

## 2020-12-09 DIAGNOSIS — K750 Abscess of liver: Secondary | ICD-10-CM

## 2020-12-09 DIAGNOSIS — K5720 Diverticulitis of large intestine with perforation and abscess without bleeding: Secondary | ICD-10-CM

## 2020-12-09 HISTORY — PX: IR RADIOLOGIST EVAL & MGMT: IMG5224

## 2020-12-09 MED ORDER — IOPAMIDOL (ISOVUE-300) INJECTION 61%
100.0000 mL | Freq: Once | INTRAVENOUS | Status: AC | PRN
  Administered 2020-12-09: 100 mL via INTRAVENOUS

## 2020-12-09 MED ORDER — AMOXICILLIN-POT CLAVULANATE 875-125 MG PO TABS: 1.0000 | ORAL_TABLET | Freq: Two times a day (BID) | ORAL | 0 refills | Status: DC

## 2020-12-09 NOTE — Progress Notes (Signed)
Sent prescription for augmentin which she will start from tomorrow after she completes IV today

## 2020-12-09 NOTE — Progress Notes (Signed)
Referring Physician(s): [name],[name] Dr Mauricia Area  Chief Complaint: The patient is seen in follow up today s/p Hepatic abscess drains x 2 11/03/20 in IR  History of present illness:  Known diverticular disease Hepatic abscesses--requiring drain x 2 11/03/20 Drain exchanges were performed 11/25/20 secondary poor output and positioning Superior drain was removed 12/02/20 with Dr Carlyon Shadow. Pt continues to follow with Dr Delaine Lame Inf disease She has finished Rocephin IV and Flagyl po Plans to go to Augmentin po tomorrow per ID  OP of abscess is little to none. Flushes 5 cc daily-- OP is waht goes in---clear to pale yellow color Denies chills; fever Does have soreness at drain site Doing well otherwise Has appt with Dr Dahlia Byes 12/5--- but colonoscopy is 12/7---- she plans to reschedule 12/5 appt to after colonoscopy.  She follows with Inf Disease closely--- to see her again in few weeks.  Scheduled today for CT and possible drain removal  Past Medical History:  Diagnosis Date   CHF (congestive heart failure) (HCC)    COPD (chronic obstructive pulmonary disease) (HCC)    Diabetes mellitus, type 2 (HCC)    GERD (gastroesophageal reflux disease)    Heart disease    Hypertension    Joint pain    Polycythemia vera ([place])    Tobacco abuse     Past Surgical History:  Procedure Laterality Date   IR CATHETER TUBE CHANGE  11/25/2020   IR CATHETER TUBE CHANGE  11/25/2020   IR RADIOLOGIST EVAL & MGMT  11/19/2020   IR RADIOLOGIST EVAL & MGMT  12/02/2020    Allergies: Patient has no known allergies.  Medications: Prior to Admission medications   Medication Sig Start Date End Date Taking? Authorizing Provider  amiodarone (PACERONE) 200 MG tablet Take 1 tablet (200 mg total) by mouth daily. 11/08/20   [name], MD  amoxicillin-clavulanate (AUGMENTIN) 875-125 MG tablet Take 1 tablet by mouth 2 (two) times daily. 12/09/20   Tsosie Billing, MD  apixaban (ELIQUIS) 5 MG  TABS tablet Take 1 tablet (5 mg total) by mouth 2 (two) times daily. 11/07/20   [name], MD  aspirin 325 MG EC tablet Take 325 mg by mouth daily.    [provider]  b complex vitamins tablet Take 1 tablet by mouth every other day.     [provider]  furosemide (LASIX) 40 MG tablet Take 1 tablet by mouth daily. 11/19/14   [provider]  gabapentin (NEURONTIN) 100 MG capsule Take 100-200 mg by mouth 3 (three) times daily. 10/20/20   [provider]  lactobacillus acidophilus & bulgar (LACTINEX) chewable tablet Chew 1 tablet by mouth 3 (three) times daily with meals. 05/03/15   Bettey Costa, MD  metFORMIN (GLUCOPHAGE) 500 MG tablet Take 1 tablet by mouth daily with breakfast. 11/19/14   [provider]  Multiple Vitamins-Minerals (MULTIVITAL PO) Take 1 tablet by mouth daily.    [provider]  pantoprazole (PROTONIX) 40 MG tablet Take 1 tablet by mouth daily. 11/19/14   [provider]  potassium chloride SA (K-DUR,KLOR-CON) 20 MEQ tablet Take 1 tablet by mouth daily. 11/20/14   [provider]  sacubitril-valsartan (ENTRESTO) 24-26 MG Take 1 tablet by mouth 2 (two) times daily. 11/07/20   [name], MD  spironolactone (ALDACTONE) 25 MG tablet Take 1 tablet by mouth daily. 11/19/14   [provider]     Family History  Problem Relation Age of Onset   Breast cancer Sister  sister #1 age 43   Kidney cancer Sister        sister #1 age -89's   Breast cancer Sister        sister #2 age 3's   Kidney disease Sister        sister #2 age 40's   Diabetes Mother    Heart disease Mother        + BYPASS SURGERY   Deep vein thrombosis Mother    Arthritis Mother    Diabetes Sister    Heart disease Sister    Hypertension Brother    Heart attack Father     Social History   Socioeconomic History   Marital status: Divorced    Spouse name: Not on file   Number of children: Not on file   Years of  education: Not on file   Highest education level: Not on file  Occupational History   Not on file  Tobacco Use   Smoking status: Every Day    Packs/day: 0.25    Years: 40.00    Pack years: 10.00    Types: Cigarettes   Smokeless tobacco: Never   Tobacco comments:    smoking since teens  Substance and Sexual Activity   Alcohol use: No    Alcohol/week: 0.0 standard drinks   Drug use: No   Sexual activity: Not Currently  Other Topics Concern   Not on file  Social History Narrative   Not on file   Social Determinants of Health   Financial Resource Strain: Not on file  Food Insecurity: Not on file  Transportation Needs: Not on file  Physical Activity: Not on file  Stress: Not on file  Social Connections: Not on file     Vital Signs: There were no vitals taken for this visit.  Physical Exam Skin:    General: Skin is warm.     Comments: Site is clean and dry Tender to touch No bleeding No sign of infection Drain bag with scant pale yellow fluid ---  CT shows resolution of abscess per Dr Garlon Hatchet removed without complication Dressing placed    Imaging: No results found.  Labs:  CBC: Recent Labs    11/04/20 0614 11/04/20 1425 11/05/20 0413 11/06/20 0420 11/25/20 0940  WBC 26.9*  --  18.7* 16.3* 8.4  HGB 12.6 13.2 13.2 13.6 13.6  HCT 34.5*  --  36.5 37.2 38.6  PLT 167  --  200 226 371    COAGS: Recent Labs    11/03/20 0619 11/25/20 0940  INR 1.3* 1.1    BMP: Recent Labs    11/03/20 0619 11/03/20 1816 11/04/20 0614 11/05/20 0413 11/06/20 0420  NA 137  --  134* 137 137  K 3.2* 3.6 3.4* 3.4* 4.2  CL 102  --  101 102 104  CO2 24  --  26 27 25   GLUCOSE 111*  --  152* 89 103*  BUN 17  --  18 16 11   CALCIUM 8.4*  --  7.7* 8.0* 8.3*  CREATININE 0.94  --  0.80 0.61 0.73  GFRNONAA >60  --  >60 >60 >60    LIVER FUNCTION TESTS: Recent Labs    11/02/20 1219 11/03/20 0619 11/04/20 0614  BILITOT 2.9* 3.2* 3.3*  AST 64* 59* 66*   ALT 77* 65* 61*  ALKPHOS 135* 131* 106  PROT 8.0 7.1 5.3*  ALBUMIN 3.1* 2.6* 2.1*    Assessment:  Hepatic abscess resolved Remaining drain removed per order Pt  to follow with Dr Dahlia Byes- surgeon Also to follow with Dr Rachelle Hora Disease   Signed: Lavonia Drafts, [name] 12/09/2020, 1:03 PM   Please refer to Dr. Laurence Ferrari attestation of this note for management and plan.

## 2020-12-09 NOTE — Telephone Encounter (Signed)
Patient reports she did have 2nd drain removed this morning. I advised her that she will take Augmentin for 2 weeks and told her to pick it up at [facility]. Patient will follow up on 12/30/20 at [facility] ID. I also emailed Lynn at [facility] and advised okay to [place].

## 2020-12-30 ENCOUNTER — Ambulatory Visit: Payer: Medicare HMO | Attending: Infectious Diseases | Admitting: Infectious Diseases

## 2020-12-30 ENCOUNTER — Other Ambulatory Visit: Payer: Self-pay

## 2020-12-30 VITALS — BP 118/75 | HR 110 | Resp 16 | Ht 63.0 in | Wt 239.0 lb

## 2020-12-30 DIAGNOSIS — Z87898 Personal history of other specified conditions: Secondary | ICD-10-CM | POA: Diagnosis not present

## 2020-12-30 DIAGNOSIS — E109 Type 1 diabetes mellitus without complications: Secondary | ICD-10-CM | POA: Insufficient documentation

## 2020-12-30 DIAGNOSIS — Z09 Encounter for follow-up examination after completed treatment for conditions other than malignant neoplasm: Secondary | ICD-10-CM | POA: Insufficient documentation

## 2020-12-30 DIAGNOSIS — K750 Abscess of liver: Secondary | ICD-10-CM

## 2020-12-30 DIAGNOSIS — R7881 Bacteremia: Secondary | ICD-10-CM | POA: Diagnosis present

## 2020-12-30 DIAGNOSIS — I110 Hypertensive heart disease with heart failure: Secondary | ICD-10-CM | POA: Diagnosis not present

## 2020-12-30 DIAGNOSIS — I509 Heart failure, unspecified: Secondary | ICD-10-CM | POA: Insufficient documentation

## 2020-12-30 DIAGNOSIS — Z7984 Long term (current) use of oral hypoglycemic drugs: Secondary | ICD-10-CM | POA: Insufficient documentation

## 2020-12-30 DIAGNOSIS — B9689 Other specified bacterial agents as the cause of diseases classified elsewhere: Secondary | ICD-10-CM | POA: Diagnosis not present

## 2020-12-30 NOTE — Patient Instructions (Addendum)
You are here for follow up of he liver abscesses and you have completed 6 weeks of treatment and your drains have been removed- lease follow up with surgery/GI regarding the diverticulitis . Follow PRN

## 2020-12-30 NOTE — Progress Notes (Signed)
NAME: Stacy Cox  DOB: 1956/01/09  MRN: [id number]  Date/Time: 12/30/2020 11:30 AM  R Subjective:   ?here for follow up of liver abscesses   Stacy Cox is a 65 y.o. with a history of DM, HTN, CHF Was  hospitalized  between  11/02/20-11/07/20 for liver abscesses secondary to diverticulitis.  She had streptococcus intermedius in blood culture and abscess culture. She had 2 drains placed in the  abscesses cavity and was sent home on IV ceftriaxone and p.o. Flagyl.for 4 weeks to complete on 12/03/20 She had problem with the drains as it was not draining and came back to ED and then saw IR on 11/25/20- A repeat Ct showed abscesses to be smaller in size but significant lower abscess cavity .the lower abscess drain not in the right position and it was repositioned- the upper drain had clotted and so it was exchanged.I saw her as outpatient  on 11/27/20, I extended the IV for another week I spoke with IR. She had a repeat CT on 11/1 and the top drain was removed as the abscess cavity had collapsed She had another Ct on 12/09/20 and IR removed the lower drain as the abscess was completely drained Pt had PICc removed on 12/10/20 and started PO augmentin and completed 2 weeks of it. She has been doing fine since then- will be getting colonoscopy Dec 1st week in preparation for diverticular resection She has some mild discomfort in the left lower quadrant and she is keeping any eye on it  No fever, chills, diarrhea, nausea, vomiting , itching or rash   Past Medical History:  Diagnosis Date   CHF (congestive heart failure) (HCC)    COPD (chronic obstructive pulmonary disease) ([place])    Diabetes mellitus, type 2 (HCC)    GERD (gastroesophageal reflux disease)    Heart disease    Hypertension    Joint pain    Polycythemia vera ([place])    Tobacco abuse     Past Surgical History:  Procedure Laterality Date   IR CATHETER TUBE CHANGE  11/25/2020   IR CATHETER TUBE CHANGE  11/25/2020   IR RADIOLOGIST  EVAL & MGMT  11/19/2020   IR RADIOLOGIST EVAL & MGMT  12/02/2020   IR RADIOLOGIST EVAL & MGMT  12/09/2020    Social History   Socioeconomic History   Marital status: Divorced    Spouse name: Not on file   Number of children: Not on file   Years of education: Not on file   Highest education level: Not on file  Occupational History   Not on file  Tobacco Use   Smoking status: Every Day    Packs/day: 0.25    Years: 40.00    Pack years: 10.00    Types: Cigarettes   Smokeless tobacco: Never   Tobacco comments:    smoking since teens  Substance and Sexual Activity   Alcohol use: No    Alcohol/week: 0.0 standard drinks   Drug use: No   Sexual activity: Not Currently  Other Topics Concern   Not on file  Social History Narrative   Not on file   Social Determinants of Health   Financial Resource Strain: Not on file  Food Insecurity: Not on file  Transportation Needs: Not on file  Physical Activity: Not on file  Stress: Not on file  Social Connections: Not on file  Intimate Partner Violence: Not on file    Family History  Problem Relation Age of Onset   Breast cancer Sister  sister #1 age 47   Kidney cancer Sister        sister #1 age -110's   Breast cancer Sister        sister #2 age 36's   Kidney disease Sister        sister #2 age 27's   Diabetes Mother    Heart disease Mother        + BYPASS SURGERY   Deep vein thrombosis Mother    Arthritis Mother    Diabetes Sister    Heart disease Sister    Hypertension Brother    Heart attack Father    No Known Allergies I? Current Outpatient Medications  Medication Sig Dispense Refill   amiodarone (PACERONE) 200 MG tablet Take 1 tablet (200 mg total) by mouth daily. 30 tablet 0   apixaban (ELIQUIS) 5 MG TABS tablet Take 1 tablet (5 mg total) by mouth 2 (two) times daily. 60 tablet 0   carvedilol (COREG CR) 20 MG 24 hr capsule Take 20 mg by mouth daily.     amoxicillin-clavulanate (AUGMENTIN) 875-125 MG tablet  Take 1 tablet by mouth 2 (two) times daily. (Patient not taking: Reported on 12/30/2020) 28 tablet 0   aspirin 325 MG EC tablet Take 325 mg by mouth daily.     b complex vitamins tablet Take 1 tablet by mouth every other day.      furosemide (LASIX) 40 MG tablet Take 1 tablet by mouth daily.     gabapentin (NEURONTIN) 100 MG capsule Take 100-200 mg by mouth 3 (three) times daily.     lactobacillus acidophilus & bulgar (LACTINEX) chewable tablet Chew 1 tablet by mouth 3 (three) times daily with meals. 60 tablet 0   metFORMIN (GLUCOPHAGE) 500 MG tablet Take 1 tablet by mouth daily with breakfast.     Multiple Vitamins-Minerals (MULTIVITAL PO) Take 1 tablet by mouth daily.     pantoprazole (PROTONIX) 40 MG tablet Take 1 tablet by mouth daily.     potassium chloride SA (K-DUR,KLOR-CON) 20 MEQ tablet Take 1 tablet by mouth daily.     sacubitril-valsartan (ENTRESTO) 24-26 MG Take 1 tablet by mouth 2 (two) times daily. 60 tablet 0   spironolactone (ALDACTONE) 25 MG tablet Take 1 tablet by mouth daily.     No current facility-administered medications for this visit.     Abtx:  Anti-infectives (From admission, onward)    None       REVIEW OF SYSTEMS:  Const: negative fever, negative chills, has some night sweats which is baseline negative weight loss Eyes: negative diplopia or visual changes, negative eye pain ENT: negative coryza, negative sore throat Resp: negative cough, hemoptysis, dyspnea Cards: negative for chest pain, palpitations, lower extremity edema GU: negative for frequency, dysuria and hematuria GI: left lower quadrant discomfort  Skin: negative for rash and pruritus Heme: negative for easy bruising and gum/nose bleeding MS: general weakness Neurolo:negative for headaches, dizziness, vertigo, memory problems  Psych: negative for feelings of anxiety, depression  Endocrine diabetes Allergy/Immunology- negative for any medication or food allergies ? Objective:  VITALS:  BP  118/75   Pulse (!) 110   Resp 16   Ht 5\' 3"  (1.6 m)   Wt 239 lb (108.4 kg)   SpO2 95%   BMI 42.34 kg/m   PHYSICAL EXAM:  General: Alert, cooperative, no distress, appears stated age.  Head: Normocephalic, without obvious abnormality, atraumatic. Eyes: Conjunctivae clear, anicteric sclerae. Pupils are equal ENT Nares normal. No drainage or sinus tenderness. Lips,  mucosa, and tongue normal. No Thrush, edentulous Neck: Supple, symmetrical, no adenopathy, thyroid: non tender no carotid bruit and no JVD. Back: No CVA tenderness. Lungs: Clear to auscultation bilaterally. No Wheezing or Rhonchi. No rales. Heart: Regular rate and rhythm, no murmur, rub or gallop. Abdomen: Soft, no tenderness  atraumatic, no cyanosis. No edema. No clubbing Skin: No rashes or lesions. Or bruising Lymph: Cervical, supraclavicular normal. Neurologic: Grossly non-focal   IMAGING RESULTS:   I have personally reviewed the films ?liver abscesses resolved  Impression/Recommendation ? ?streptococcus intermedius bacteremia- treated completely Diverticulitis Small Peridiverticular abscess Liver abscesses - large X 2-treated with drains and 5 weeks of Iv ceftriaxone and flagyl antibiotics followed by 2 weeks of PO augmenti. Drains have been removed 11/1 and 11/8 and last CT on 11/8 shows complete resolution of abscess  Diverticulitis- resolved- awaiting surgical intervention She is getting colonoscopy  Dec Mild left lower quadrant discomfort- she will keep an eye- if it gets worse she will inform the surgeon   DM- on metformin-  some food indiscretion- She will get back on track in New year she says.  Discussed the management with patient and daughter   Follow PRN ___________________________________________________

## 2021-01-05 ENCOUNTER — Ambulatory Visit: Payer: Medicare HMO | Admitting: Surgery

## 2021-01-05 ENCOUNTER — Telehealth: Payer: Self-pay

## 2021-01-05 NOTE — Telephone Encounter (Signed)
Called to inform patient that we have not received her blood thinner clearance yet. Will contact the doctor's office.

## 2021-01-06 ENCOUNTER — Encounter: Payer: Self-pay | Admitting: Gastroenterology

## 2021-01-07 ENCOUNTER — Ambulatory Visit: Payer: Medicare HMO | Admitting: Certified Registered"

## 2021-01-07 ENCOUNTER — Encounter: Payer: Self-pay | Admitting: Gastroenterology

## 2021-01-07 ENCOUNTER — Encounter
Admission: RE | Disposition: A | Discharge status: Home / Self Care | Payer: Self-pay | Source: Home / Self Care | Attending: Gastroenterology

## 2021-01-07 ENCOUNTER — Ambulatory Visit
Admission: RE | Admit: 2021-01-07 | Discharge: 2021-01-07 | Disposition: A | Discharge status: Home / Self Care | Payer: Medicare HMO | Attending: Gastroenterology | Admitting: Gastroenterology

## 2021-01-07 DIAGNOSIS — K5792 Diverticulitis of intestine, part unspecified, without perforation or abscess without bleeding: Secondary | ICD-10-CM

## 2021-01-07 DIAGNOSIS — G4730 Sleep apnea, unspecified: Secondary | ICD-10-CM | POA: Diagnosis not present

## 2021-01-07 DIAGNOSIS — K635 Polyp of colon: Secondary | ICD-10-CM | POA: Insufficient documentation

## 2021-01-07 DIAGNOSIS — K639 Disease of intestine, unspecified: Secondary | ICD-10-CM

## 2021-01-07 DIAGNOSIS — K5730 Diverticulosis of large intestine without perforation or abscess without bleeding: Secondary | ICD-10-CM | POA: Diagnosis not present

## 2021-01-07 DIAGNOSIS — Z09 Encounter for follow-up examination after completed treatment for conditions other than malignant neoplasm: Secondary | ICD-10-CM | POA: Diagnosis not present

## 2021-01-07 DIAGNOSIS — K6389 Other specified diseases of intestine: Secondary | ICD-10-CM | POA: Insufficient documentation

## 2021-01-07 DIAGNOSIS — D125 Benign neoplasm of sigmoid colon: Secondary | ICD-10-CM | POA: Diagnosis not present

## 2021-01-07 DIAGNOSIS — E119 Type 2 diabetes mellitus without complications: Secondary | ICD-10-CM | POA: Diagnosis not present

## 2021-01-07 DIAGNOSIS — D122 Benign neoplasm of ascending colon: Secondary | ICD-10-CM | POA: Diagnosis not present

## 2021-01-07 DIAGNOSIS — K648 Other hemorrhoids: Secondary | ICD-10-CM | POA: Diagnosis not present

## 2021-01-07 DIAGNOSIS — K5732 Diverticulitis of large intestine without perforation or abscess without bleeding: Secondary | ICD-10-CM | POA: Diagnosis present

## 2021-01-07 DIAGNOSIS — K621 Rectal polyp: Secondary | ICD-10-CM | POA: Diagnosis not present

## 2021-01-07 DIAGNOSIS — J449 Chronic obstructive pulmonary disease, unspecified: Secondary | ICD-10-CM | POA: Diagnosis not present

## 2021-01-07 DIAGNOSIS — Z1211 Encounter for screening for malignant neoplasm of colon: Secondary | ICD-10-CM

## 2021-01-07 DIAGNOSIS — K219 Gastro-esophageal reflux disease without esophagitis: Secondary | ICD-10-CM | POA: Diagnosis not present

## 2021-01-07 DIAGNOSIS — I509 Heart failure, unspecified: Secondary | ICD-10-CM | POA: Insufficient documentation

## 2021-01-07 DIAGNOSIS — I110 Hypertensive heart disease with heart failure: Secondary | ICD-10-CM | POA: Insufficient documentation

## 2021-01-07 DIAGNOSIS — D45 Polycythemia vera: Secondary | ICD-10-CM | POA: Insufficient documentation

## 2021-01-07 DIAGNOSIS — F17210 Nicotine dependence, cigarettes, uncomplicated: Secondary | ICD-10-CM | POA: Diagnosis not present

## 2021-01-07 HISTORY — PX: COLONOSCOPY WITH PROPOFOL: SHX5780

## 2021-01-07 HISTORY — DX: Sleep apnea, unspecified: G47.30

## 2021-01-07 LAB — GLUCOSE, CAPILLARY: Glucose-Capillary: 129 mg/dL — ABNORMAL HIGH (ref 70–99)

## 2021-01-07 SURGERY — COLONOSCOPY WITH PROPOFOL
Anesthesia: General

## 2021-01-07 MED ORDER — PHENYLEPHRINE HCL (PRESSORS) 10 MG/ML IV SOLN
INTRAVENOUS | Status: AC
  Filled 2021-01-07: qty 1

## 2021-01-07 MED ORDER — PROPOFOL 500 MG/50ML IV EMUL
INTRAVENOUS | Status: AC
  Filled 2021-01-07: qty 50

## 2021-01-07 MED ORDER — LABETALOL HCL 5 MG/ML IV SOLN
INTRAVENOUS | Status: DC | PRN
  Administered 2021-01-07: 5 mg via INTRAVENOUS

## 2021-01-07 MED ORDER — PROPOFOL 10 MG/ML IV BOLUS
INTRAVENOUS | Status: AC
  Filled 2021-01-07: qty 20

## 2021-01-07 MED ORDER — PROPOFOL 10 MG/ML IV BOLUS
INTRAVENOUS | Status: DC | PRN
  Administered 2021-01-07: 60 mg via INTRAVENOUS

## 2021-01-07 MED ORDER — SODIUM CHLORIDE 0.9 % IV SOLN: INTRAVENOUS | Status: DC

## 2021-01-07 MED ORDER — LABETALOL HCL 5 MG/ML IV SOLN
INTRAVENOUS | Status: AC
  Filled 2021-01-07: qty 4

## 2021-01-07 MED ORDER — PROPOFOL 500 MG/50ML IV EMUL
INTRAVENOUS | Status: DC | PRN
  Administered 2021-01-07: 140 ug/kg/min via INTRAVENOUS

## 2021-01-07 NOTE — Anesthesia Postprocedure Evaluation (Signed)
Anesthesia Post Note  Patient: Stacy Cox  Procedure(s) Performed: COLONOSCOPY WITH PROPOFOL  Patient location during evaluation: Endoscopy Anesthesia Type: General Level of consciousness: awake and alert Pain management: pain level controlled Vital Signs Assessment: post-procedure vital signs reviewed and stable Respiratory status: spontaneous breathing, nonlabored ventilation, respiratory function stable and patient connected to nasal cannula oxygen Cardiovascular status: blood pressure returned to baseline and stable Postop Assessment: no apparent nausea or vomiting Anesthetic complications: no   No notable events documented.   Last Vitals:  Vitals:   01/07/21 1020 01/07/21 1030  BP: 108/74 110/72  Pulse: 76 73  Resp: (!) 29 (!) 24  Temp:    SpO2: 100% 100%    Last Pain:  Vitals:   01/07/21 1010  TempSrc: Temporal                 Precious Haws Pink Maye

## 2021-01-07 NOTE — H&P (Signed)
Stacy Antigua, MD [address] [place], [place], [place], [address] [address], [place], [place], [place], [address] Phone: [phone]  Fax: [phone]  Primary Care Physician:  Donnie Coffin, MD   Pre-Procedure History & Physical: HPI:  Stacy Cox is a 65 y.o. female is here for a colonoscopy.   Past Medical History:  Diagnosis Date   CHF (congestive heart failure) (HCC)    COPD (chronic obstructive pulmonary disease) (HCC)    Diabetes mellitus, type 2 (HCC)    GERD (gastroesophageal reflux disease)    Heart disease    Hypertension    Joint pain    Polycythemia vera ([place])    Sleep apnea    Tobacco abuse     Past Surgical History:  Procedure Laterality Date   IR CATHETER TUBE CHANGE  11/25/2020   IR CATHETER TUBE CHANGE  11/25/2020   IR RADIOLOGIST EVAL & MGMT  11/19/2020   IR RADIOLOGIST EVAL & MGMT  12/02/2020   IR RADIOLOGIST EVAL & MGMT  12/09/2020    Prior to Admission medications   Medication Sig Start Date End Date Taking? Authorizing Provider  amiodarone (PACERONE) 200 MG tablet Take 1 tablet (200 mg total) by mouth daily. 11/08/20  Yes [name], MD  carvedilol (COREG CR) 20 MG 24 hr capsule Take 20 mg by mouth daily.   Yes [provider]  furosemide (LASIX) 40 MG tablet Take 1 tablet by mouth daily. 11/19/14  Yes [provider]  gabapentin (NEURONTIN) 100 MG capsule Take 100-200 mg by mouth 3 (three) times daily. 10/20/20  Yes [provider]  metFORMIN (GLUCOPHAGE) 500 MG tablet Take 1 tablet by mouth daily with breakfast. 11/19/14  Yes [provider]  pantoprazole (PROTONIX) 40 MG tablet Take 1 tablet by mouth daily. 11/19/14  Yes [provider]  potassium chloride SA (K-DUR,KLOR-CON) 20 MEQ tablet Take 1 tablet by mouth daily. 11/20/14  Yes [provider]  sacubitril-valsartan (ENTRESTO) 24-26 MG Take 1 tablet by mouth 2 (two) times daily. 11/07/20  Yes [name], MD   spironolactone (ALDACTONE) 25 MG tablet Take 1 tablet by mouth daily. 11/19/14  Yes [provider]  amoxicillin-clavulanate (AUGMENTIN) 875-125 MG tablet Take 1 tablet by mouth 2 (two) times daily. Patient not taking: Reported on 12/30/2020 12/09/20   Tsosie Billing, MD  apixaban (ELIQUIS) 5 MG TABS tablet Take 1 tablet (5 mg total) by mouth 2 (two) times daily. 11/07/20   [name], MD  aspirin 325 MG EC tablet Take 325 mg by mouth daily.    [provider]  b complex vitamins tablet Take 1 tablet by mouth every other day.     [provider]  lactobacillus acidophilus & bulgar (LACTINEX) chewable tablet Chew 1 tablet by mouth 3 (three) times daily with meals. 05/03/15   Bettey Costa, MD  Multiple Vitamins-Minerals (MULTIVITAL PO) Take 1 tablet by mouth daily.    [provider]    Allergies as of 12/04/2020   (No Known Allergies)    Family History  Problem Relation Age of Onset   Breast cancer Sister        sister #1 age 31   Kidney cancer Sister        sister #1 age -17's   Breast cancer Sister        sister #2 age 74's   Kidney disease Sister        sister #2 age 19's   Diabetes Mother    Heart  disease Mother        + BYPASS SURGERY   Deep vein thrombosis Mother    Arthritis Mother    Diabetes Sister    Heart disease Sister    Hypertension Brother    Heart attack Father     Social History   Socioeconomic History   Marital status: Divorced    Spouse name: Not on file   Number of children: Not on file   Years of education: Not on file   Highest education level: Not on file  Occupational History   Not on file  Tobacco Use   Smoking status: Every Day    Packs/day: 0.25    Years: 40.00    Pack years: 10.00    Types: Cigarettes   Smokeless tobacco: Never   Tobacco comments:    smoking since teens  Vaping Use   Vaping Use: Never used  Substance and Sexual Activity   Alcohol use: No    Alcohol/week: 0.0 standard drinks    Drug use: No   Sexual activity: Not Currently  Other Topics Concern   Not on file  Social History Narrative   Not on file   Social Determinants of Health   Financial Resource Strain: Not on file  Food Insecurity: Not on file  Transportation Needs: Not on file  Physical Activity: Not on file  Stress: Not on file  Social Connections: Not on file  Intimate Partner Violence: Not on file    Review of Systems: See HPI, otherwise negative ROS  Physical Exam: Constitutional: General:   Alert,  Well-developed, well-nourished, pleasant and cooperative in NAD BP 135/83   Pulse 82   Temp (!) 97.2 F (36.2 C) (Temporal)   Resp 18   Ht 5\' 8"  (1.727 m)   Wt 106.1 kg   SpO2 100%   BMI 35.58 kg/m   Head: Normocephalic, atraumatic.   Eyes:  Sclera clear, no icterus.   Conjunctiva pink.   Mouth:  No deformity or lesions, oropharynx pink & moist.  Neck:  Supple, trachea midline  Respiratory: Normal respiratory effort  Gastrointestinal:  Soft, non-tender and non-distended without masses, hepatosplenomegaly or hernias noted.  No guarding or rebound tenderness.     Cardiac: No clubbing or edema.  No cyanosis. Normal posterior tibial pedal pulses noted.  Lymphatic:  No significant cervical adenopathy.  Psych:  Alert and cooperative. Normal mood and affect.  Musculoskeletal:   Symmetrical without gross deformities. 5/5 Lower extremity strength bilaterally.  Skin: Warm. Intact without significant lesions or rashes. No jaundice.  Neurologic:  Face symmetrical, tongue midline, Normal sensation to touch;  grossly normal neurologically.  Psych:  Alert and oriented x3, Alert and cooperative. Normal mood and affect.  Impression/Plan: Stacy Cox is here for a colonoscopy to be performed for diverticulitis. This pt was referred by surgery for acute diverticulitis and was scheduled by clinic as direct colonoscopy. She was not seen in clinic before this procedure. A review of her  records show that she is followed by ID and surgery and has undergone drain placement and removal for liver abscess in the recent past and has completed antibiotics. Recent CT scan does not report any further diverticulitis or abscess, therefore it is appropriate to proceed with colonoscopy at this time given her recent complicated diverticulitis. She had a colonoscopy in 2018 (see care everywhere). Indication was follow up of diverticulitis. Findings as follows:  Impression:        - Fixed and tortous sigmoid with mild narrowing  in the                     sigmoid colon due to mucosal edema.                     - Exam completed by switching to PCF and using water                     immersion technique.                     - No malignant lesion or polyps.                     - The examination was otherwise normal on direct and                     retroflexion views.                     - No specimens collected.   Risks, benefits, limitations, and alternatives regarding  colonoscopy have been reviewed with the patient.  Questions have been answered.  All parties agreeable.   [name], MD  01/07/2021, 9:20 AM

## 2021-01-07 NOTE — Transfer of Care (Signed)
Immediate Anesthesia Transfer of Care Note  Patient: Stacy Cox  Procedure(s) Performed: COLONOSCOPY WITH PROPOFOL  Patient Location: PACU  Anesthesia Type:General  Level of Consciousness: awake and alert   Airway & Oxygen Therapy: Patient Spontanous Breathing and Patient connected to nasal cannula oxygen  Post-op Assessment: Report given to RN and Post -op Vital signs reviewed and stable  Post vital signs: Reviewed and stable  Last Vitals:  Vitals Value Taken Time  BP 91/68 01/07/21 1016  Temp    Pulse 75 01/07/21 1016  Resp 19 01/07/21 1016  SpO2 99 % 01/07/21 1016  Vitals shown include unvalidated device data.  Last Pain:  Vitals:   01/07/21 0854  TempSrc: Temporal         Complications: No notable events documented.

## 2021-01-07 NOTE — Anesthesia Preprocedure Evaluation (Signed)
Anesthesia Evaluation  Patient identified by MRN, date of birth, ID band Patient awake    Reviewed: Allergy & Precautions, NPO status , Patient's Chart, lab work & pertinent test results  History of Anesthesia Complications Negative for: history of anesthetic complications  Airway Mallampati: III  TM Distance: >3 FB Neck ROM: full    Dental  (+) Chipped, Poor Dentition, Missing   Pulmonary neg shortness of breath, sleep apnea , COPD, Current Smoker and Patient abstained from smoking.,    Pulmonary exam normal        Cardiovascular Exercise Tolerance: Good hypertension, (-) angina+CHF  Normal cardiovascular exam     Neuro/Psych negative neurological ROS  negative psych ROS   GI/Hepatic Neg liver ROS, GERD  Medicated and Controlled,  Endo/Other  diabetes, Type 2  Renal/GU negative Renal ROS  negative genitourinary   Musculoskeletal   Abdominal   Peds  Hematology negative hematology ROS (+)   Anesthesia Other Findings Past Medical History: No date: CHF (congestive heart failure) (HCC) No date: COPD (chronic obstructive pulmonary disease) (HCC) No date: Diabetes mellitus, type 2 (HCC) No date: GERD (gastroesophageal reflux disease) No date: Heart disease No date: Hypertension No date: Joint pain No date: Polycythemia vera ([place]) No date: Sleep apnea No date: Tobacco abuse  Past Surgical History: 11/25/2020: IR CATHETER TUBE CHANGE 11/25/2020: IR CATHETER TUBE CHANGE 11/19/2020: IR RADIOLOGIST EVAL & MGMT 12/02/2020: IR RADIOLOGIST EVAL & MGMT 12/09/2020: IR RADIOLOGIST EVAL & MGMT     Reproductive/Obstetrics negative OB ROS                             Anesthesia Physical Anesthesia Plan  ASA: 3  Anesthesia Plan: General   Post-op Pain Management:    Induction: Intravenous  PONV Risk Score and Plan: Propofol infusion and TIVA  Airway Management Planned: Natural Airway and  Nasal Cannula  Additional Equipment:   Intra-op Plan:   Post-operative Plan:   Informed Consent: I have reviewed the patients History and Physical, chart, labs and discussed the procedure including the risks, benefits and alternatives for the proposed anesthesia with the patient or authorized representative who has indicated his/her understanding and acceptance.     Dental Advisory Given  Plan Discussed with: Anesthesiologist, CRNA and Surgeon  Anesthesia Plan Comments: (Patient consented for risks of anesthesia including but not limited to:  - adverse reactions to medications - risk of airway placement if required - damage to eyes, teeth, lips or other oral mucosa - nerve damage due to positioning  - sore throat or hoarseness - Damage to heart, brain, nerves, lungs, other parts of body or loss of life  Patient voiced understanding.)        Anesthesia Quick Evaluation

## 2021-01-07 NOTE — Op Note (Signed)
[facility] Gastroenterology Patient Name: Stacy Cox Procedure Date: 01/07/2021 8:00 AM MRN: [id number] Account #: [id number] Date of Birth: 27-Jun-1955 Admit Type: Outpatient Age: 65 Room: [facility] ENDO ROOM 3 Gender: Female Note Status: Finalized Instrument Name: Colonoscope [id number] Procedure:             Colonoscopy Indications:           Follow-up of diverticulitis Providers:             Aaronmichael Brumbaugh B. Bonna Gains MD, MD Referring MD:          Edmonia Lynch. Aycock MD (Referring MD) Medicines:             Monitored Anesthesia Care Complications:         No immediate complications. Procedure:             Pre-Anesthesia Assessment:                        - ASA Grade Assessment: II - A patient with mild                         systemic disease.                        - Prior to the procedure, a History and Physical was                         performed, and patient medications, allergies and                         sensitivities were reviewed. The patient's tolerance                         of previous anesthesia was reviewed.                        - The risks and benefits of the procedure and the                         sedation options and risks were discussed with the                         patient. All questions were answered and informed                         consent was obtained.                        - Patient identification and proposed procedure were                         verified prior to the procedure by the physician, the                         nurse, the anesthesiologist, the anesthetist and the                         technician. The procedure was verified in the  procedure room.                        After obtaining informed consent, the colonoscope was                         passed under direct vision. Throughout the procedure,                         the patient's blood pressure, pulse, and oxygen                          saturations were monitored continuously. The                         Colonoscope was introduced through the anus and                         advanced to the the cecum, identified by appendiceal                         orifice and ileocecal valve. The colonoscopy was                         performed with ease. The patient tolerated the                         procedure well. The quality of the bowel preparation                         was fair. Findings:      The perianal and digital rectal examinations were normal.      A 6 mm polyp was found in the ascending colon. The polyp was sessile.       The polyp was removed with a cold snare. Resection and retrieval were       complete. For hemostasis, one hemostatic clip was successfully placed.       There was no bleeding at the end of the procedure.      Two sessile polyps were found in the ascending colon. The polyps were 4       to 5 mm in size. These polyps were removed with a cold snare. Resection       and retrieval were complete.      A 5 mm polyp was found in the sigmoid colon. The polyp was sessile. The       polyp was removed with a cold snare. Resection and retrieval were       complete.      A 7 mm polyp was found at 30 cm proximal to the anus. The polyp was       flat. The polyp was removed with a cold snare. Resection and retrieval       were complete.      Two sessile polyps were found in the rectum and sigmoid colon. The       polyps were 3 to 4 mm in size. These polyps were removed with a cold       biopsy forceps. Resection and retrieval were complete. These polyps       appeared hyperplastic and thus were placed in the same jar.  Multiple small and large-mouthed diverticula were found in the sigmoid       colon. There was narrowing of the colon in association with the       diverticular opening. There was evidence of diverticular spasm. Erythema       was seen in association with the diverticular opening. There was no        evidence of diverticular bleeding.      A patchy area of mildly erythematous mucosa was found in the sigmoid       colon. Biopsies were taken with a cold forceps for histology.      The exam was otherwise without abnormality.      Non-bleeding internal hemorrhoids were found during retroflexion. Impression:            - Preparation of the colon was fair.                        - One 6 mm polyp in the ascending colon, removed with                         a cold snare. Resected and retrieved. Clip was placed.                        - Two 4 to 5 mm polyps in the ascending colon, removed                         with a cold snare. Resected and retrieved.                        - One 5 mm polyp in the sigmoid colon, removed with a                         cold snare. Resected and retrieved.                        - One 7 mm polyp at 30 cm proximal to the anus,                         removed with a cold snare. Resected and retrieved.                        - Two 3 to 4 mm polyps in the rectum and in the                         sigmoid colon, removed with a cold biopsy forceps.                         Resected and retrieved.                        - Diverticulosis in the sigmoid colon. There was                         narrowing of the colon in association with the                         diverticular opening. There was evidence of  diverticular spasm. Erythema was seen in association                         with the diverticular opening. There was no evidence                         of diverticular bleeding.                        - Erythematous mucosa in the sigmoid colon. Biopsied.                        - The examination was otherwise normal.                        - Non-bleeding internal hemorrhoids. Recommendation:        - Discharge patient to home (with escort).                        - Return to GI clinic in 4 weeks.                        - Importance of  being seen in GI clinic was discussed                         with pt in detail before and after the procedure due                         to her clinical history. Pt has never been seen by a                         gastroenterologist before and has history of                         diverticulitis in 2018, 2010 and recent liver                         abscesses as well followed by ID and surgery                        - Advance diet as tolerated.                        - Continue present medications.                        - Await pathology results.                        - Repeat colonoscopy date to be determined after                         pending pathology results are reviewed.                        - The findings and recommendations were discussed with                         the patient.                        -  The findings and recommendations were discussed with                         the patient's family.                        - Return to primary care physician as previously                         scheduled.                        - High fiber diet. Procedure Code(s):     --- Professional ---                        [phone], Colonoscopy, flexible; with removal of                         tumor(s), polyp(s), or other lesion(s) by snare                         technique                        45380, 48, Colonoscopy, flexible; with biopsy, single                         or multiple Diagnosis Code(s):     --- Professional ---                        K63.5, Polyp of colon                        K62.1, Rectal polyp                        K63.89, Other specified diseases of intestine                        K57.32, Diverticulitis of large intestine without                         perforation or abscess without bleeding                        K57.30, Diverticulosis of large intestine without                         perforation or abscess without bleeding CPT copyright 2019 American  Medical Association. All rights reserved. The codes documented in this report are preliminary and upon coder review may  be revised to meet current compliance requirements.  [name], MD Margretta Sidle B. Bonna Gains MD, MD 01/07/2021 10:27:49 AM This report has been signed electronically. Number of Addenda: 0 Note Initiated On: 01/07/2021 8:00 AM Scope Withdrawal Time: 0 hours 37 minutes 23 seconds  Total Procedure Duration: 0 hours 43 minutes 35 seconds  Estimated Blood Loss:  Estimated blood loss: none.      [facility]

## 2021-01-08 ENCOUNTER — Telehealth: Payer: Self-pay

## 2021-01-08 ENCOUNTER — Encounter: Payer: Self-pay | Admitting: Gastroenterology

## 2021-01-08 LAB — SURGICAL PATHOLOGY

## 2021-01-08 NOTE — Telephone Encounter (Signed)
Stacy Cox, this pt was set up for outpt colonoscopy and has never been seen in GI clinic. She has multiple issues including history of diverticulitis and liver abscesses and should see a GI. I talked to her about this and she is fine with it. Any provider in the next 6-8 weeks would be fine since she hasnt seen anyone

## 2021-01-15 NOTE — Telephone Encounter (Signed)
Appt scheduled with Dr Vicente Males 03/18/21

## 2021-01-19 ENCOUNTER — Encounter: Payer: Self-pay | Admitting: Surgery

## 2021-01-19 ENCOUNTER — Ambulatory Visit: Payer: Medicare HMO | Admitting: Surgery

## 2021-01-19 ENCOUNTER — Telehealth: Payer: Self-pay

## 2021-01-19 ENCOUNTER — Telehealth: Payer: Self-pay | Admitting: Surgery

## 2021-01-19 ENCOUNTER — Other Ambulatory Visit: Payer: Self-pay

## 2021-01-19 VITALS — BP 125/69 | HR 81 | Temp 98.4°F | Ht 68.0 in | Wt 237.0 lb

## 2021-01-19 DIAGNOSIS — K5732 Diverticulitis of large intestine without perforation or abscess without bleeding: Secondary | ICD-10-CM | POA: Diagnosis not present

## 2021-01-19 MED ORDER — BISACODYL 5 MG PO TBEC: DELAYED_RELEASE_TABLET | ORAL | 0 refills | Status: DC

## 2021-01-19 MED ORDER — METRONIDAZOLE 500 MG PO TABS: ORAL_TABLET | ORAL | 0 refills | Status: DC

## 2021-01-19 MED ORDER — POLYETHYLENE GLYCOL 3350 17 GM/SCOOP PO POWD: ORAL | 0 refills | Status: DC

## 2021-01-19 MED ORDER — NEOMYCIN SULFATE 500 MG PO TABS: ORAL_TABLET | ORAL | 0 refills | Status: DC

## 2021-01-19 MED ORDER — NICOTINE 21 MG/24HR TD PT24: 21.0000 mg | MEDICATED_PATCH | Freq: Every day | TRANSDERMAL | 2 refills | Status: DC

## 2021-01-19 NOTE — Telephone Encounter (Signed)
Patient has been advised of Pre-Admission date/time, COVID Testing date and Surgery date.  Surgery Date: 02/26/21 Preadmission Testing Date: 02/13/21 (phone 8a-1p) Covid Testing Date: 02/24/21 @ 8:40 am  - patient advised to go to the [facility] ([place]) between 8a-12:00 pm  Patient has been made aware to call [phone], between 1-3:00pm the day before surgery, to find out what time to arrive for surgery.

## 2021-01-19 NOTE — Telephone Encounter (Signed)
Patient calls back, she is now informed of stopping Eliquis 3 days prior to surgery and stop aspirin 7 days prior.  Patient verbalized understanding.

## 2021-01-19 NOTE — Patient Instructions (Addendum)
We have discussed removing a portion of your damaged colon through 4 small incisions today. We have scheduled this surgery for 02/24/21 at [facility] with Dr. Dahlia Byes. Please plan a hospital stay of 3-7 days for surgery and recovery time.  We will have you complete a bowel prep prior to your surgery. Please see information provided. You have also been given a (Blue) Pre-Care Sheet with more information regarding your particular surgery. Our surgery scheduler will call you to verify surgery date and to go over information for surgery.   You will need to stop your Eliquis(Apixaban) 2 days prior to surgery. You will need to stop your 325 mg Aspirin-5 days prior to surgery.  You can use Benefiber or Citrucel for increased fiber. Do not use this after surgery. Start a food diary along with the end result.   Please review all information given.  Please call our office with any questions or concerns prior to your scheduled surgery.   Laparoscopic Colectomy Laparoscopic colectomy is surgery to remove part or all of the large intestine (colon). This procedure may be used to treat several conditions, including: Inflammation and infection of the colon (diverticulitis). Tumors or masses in the colon. Inflammatory bowel disease, such as Crohn disease or ulcerative colitis. Colectomy is an option when symptoms cannot be controlled with medicines. Bleeding from the colon that cannot be controlled by another method. Blockage or obstruction of the colon.  Tell a health care provider about: Any allergies you have. All medicines you are taking, including vitamins, herbs, eye drops, creams, and over-the-counter medicines. Any problems you or family members have had with anesthetic medicines. Any blood disorders you have. Any surgeries you have had. Any medical conditions you have. What are the risks? Generally, this is a safe procedure. However, problems may occur, including: Infection. Bleeding. Allergic  reactions to medicines or dyes. Damage to other structures or organs. Leaking from where the colon was sewn together. Future blockage of the small intestines from scar tissue. Another surgery may be needed to repair this. Needing to convert to an open procedure. Complications such as damage to other organs or excessive bleeding may require the surgeon to convert from a laparoscopic procedure to an open procedure. This involves making a larger incision in the abdomen.  What happens before the procedure? Staying hydrated Follow instructions from your health care provider about hydration, which may include: Up to 2 hours before the procedure - you may continue to drink clear liquids, such as water, clear fruit juice, black coffee, and plain tea. Medicines Ask your health care provider about: Changing or stopping your regular medicines. This is especially important if you are taking diabetes medicines or blood thinners. Taking medicines such as aspirin and ibuprofen. These medicines can thin your blood. Do not take these medicines before your procedure if your health care provider instructs you not to. You may be given antibiotic medicine to clean out bacteria from your colon. Follow the directions carefully and take the medicine at the correct time. General instructions You may be prescribed an oral bowel prep to clean out your colon in preparation for the surgery: Follow instructions from your health care provider about how to do this. Do not eat or drink anything else after you have started the bowel prep, unless your health care provider tells you it is safe to do so. Do not use any products that contain nicotine or tobacco, such as cigarettes and e-cigarettes. If you need help quitting, ask your health care provider.  What happens during the procedure? To reduce your risk of infection: Your health care team will wash or sanitize their hands. Your skin will be washed with soap. An IV tube will  be inserted into one of your veins to deliver fluid and medication. You will be given one of the following: A medicine to help you relax (sedative). A medicine to make you fall asleep (general anesthetic). Small monitors will be connected to your body. They will be used to check your heart, blood pressure, and oxygen level. A breathing tube may be placed into your lungs during the procedure. A thin, flexible tube (catheter) will be placed into your bladder to drain urine. A tube may be placed through your nose and into your stomach to drain stomach fluids (nasogastric tube, or NG tube). Your abdomen will be filled with air so it expands. This gives the surgeon more room to operate and makes your organs easier to see. Several small cuts (incisions) will be made in your abdomen. A thin, lighted tube with a tiny camera on the end (laparoscope) will be put through one of the small incisions. The camera on the laparoscope will send a picture to a computer screen in the operating room. This will give the surgeon a good view inside your abdomen. Hollow tubes will be put through the other small incisions in your abdomen. The tools that are needed for the procedure will be put through these tubes. Clamps or staples will be put on both ends of the diseased part of the colon. The part of the intestine between the clamps or staples will be removed. If possible, the ends of the healthy colon that remain will be stitched (sutured) or stapled together to allow your body to pass waste (stool). Sometimes, the remaining colon cannot be stitched back together. If this is the case, a colostomy will be needed. If you need a colostomy: An opening to the outside of your body (stoma) will be made through your abdomen. The end of your colon will be brought to the opening. It will be stitched to the skin. A bag will be attached to the opening. Stool will drain into this removable bag. The colostomy may be temporary or  permanent. The incisions from the colectomy will be closed with sutures or staples. The procedure may vary among health care providers and hospitals. What happens after the procedure? Your blood pressure, heart rate, breathing rate, and blood oxygen level will be monitored until the medicines you were given have worn off. You will receive fluids through an IV tube until your bowels start to work properly. Once your bowels are working again, you will be given clear liquids first and then solid food as tolerated. You will be given medicines to control your pain and nausea, if needed. Do not drive for 24 hours if you were given a sedative. This information is not intended to replace advice given to you by your health care provider. Make sure you discuss any questions you have with your health care provider. Document Released: 04/10/2002 Document Revised: 10/20/2015 Document Reviewed: 10/20/2015 Elsevier Interactive Patient Education  2018 [employer].     Laparoscopic Colectomy, Care After This sheet gives you information about how to care for yourself after your procedure. Your health care provider may also give you more specific instructions. If you have problems or questions, contact your health care provider. What can I expect after the procedure? After your procedure, it is common to have the following: Pain in  your abdomen, especially in the incision areas. You will be given medicine to control the pain. Tiredness. This is a normal part of the recovery process. Your energy level will return to normal over the next several weeks. Changes in your bowel movements, such as constipation or needing to go more often. Talk with your health care provider about how to manage this.  Follow these instructions at home: Medicines Take over-the-counter and prescription medicines only as told by your health care provider. Do not drive or use heavy machinery while taking prescription pain  medicine. Do not drink alcohol while taking prescription pain medicine. If you were prescribed an antibiotic medicine, use it as told by your health care provider. Do not stop using the antibiotic even if you start to feel better. Incision care Follow instructions from your health care provider about how to take care of your incision areas. Make sure you: Keep your incisions clean and dry. Wash your hands with soap and water before and after applying medicine to the areas, and before and after changing your bandage (dressing). If soap and water are not available, use hand sanitizer. Change your dressing as told by your health care provider. Leave stitches (sutures), skin glue, or adhesive strips in place. These skin closures may need to stay in place for 2 weeks or longer. If adhesive strip edges start to loosen and curl up, you may trim the loose edges. Do not remove adhesive strips completely unless your health care provider tells you to do that. Do not wear tight clothing over the incisions. Tight clothing may rub and irritate the incision areas, which may cause the incisions to open. Do not take baths, swim, or use a hot tub until your health care provider approves. Ask your health care provider if you can take showers. You may only be allowed to take sponge baths for bathing. Check your incision area every day for signs of infection. Check for: More redness, swelling, or pain. More fluid or blood. Warmth. Pus or a bad smell. Activity Avoid lifting anything that is heavier than 10 lb (4.5 kg) for 2 weeks or until your health care provider says it is okay. You may resume normal activities as told by your health care provider. Ask your health care provider what activities are safe for you. Take rest breaks during the day as needed. Eating and drinking Follow instructions from your health care provider about what you can eat after surgery. To prevent or treat constipation while you are taking  prescription pain medicine, your health care provider may recommend that you: Drink enough fluid to keep your urine clear or pale yellow. Take over-the-counter or prescription medicines. Eat foods that are high in fiber, such as fresh fruits and vegetables, whole grains, and beans. Limit foods that are high in fat and processed sugars, such as fried and sweet foods. General instructions Ask your health care provider when you will need an appointment to get your sutures or staples removed. Keep all follow-up visits as told by your health care provider. This is important. Contact a health care provider if: You have more redness, swelling, or pain around your incisions. You have more fluid or blood coming from the incisions. Your incisions feel warm to the touch. You have pus or a bad smell coming from your incisions or your dressing. You have a fever. You have an incision that breaks open (edges not staying together) after sutures or staples have been removed. Get help right away if: You  develop a rash. You have chest pain or difficulty breathing. You have pain or swelling in your legs. You feel light-headed or you faint. Your abdomen swells (becomes distended). You have nausea or vomiting. You have blood in your stool (feces). This information is not intended to replace advice given to you by your health care provider. Make sure you discuss any questions you have with your health care provider. Document Released: 08/07/2004 Document Revised: 10/20/2015 Document Reviewed: 10/20/2015 Elsevier Interactive Patient Education  [employer].

## 2021-01-19 NOTE — Progress Notes (Signed)
Request for Cardiology clearance and for patient to stop Eliquis and ASA has been faxed to Dr April Manson office.

## 2021-01-19 NOTE — Telephone Encounter (Signed)
Left detailed message on VM.    Cardiac Clearance received from Dr.Shaukat Khan-Stop Eliquis three days prior to surgery. Stop Aspirin 7 days prior. -medium rick -patient optimized for surgery.

## 2021-01-20 ENCOUNTER — Encounter: Payer: Self-pay | Admitting: Surgery

## 2021-01-20 NOTE — Progress Notes (Signed)
Outpatient Surgical Follow Up  01/20/2021  Stacy Cox is an 65 y.o. female.   Chief Complaint  Patient presents with   Follow-up    HPI: Stacy Cox is a 65 y.o. with a history of DM, HTN, CHF Is here for follow up after recent hospitalization for liver abscesses secondary to diverticulitis ( she also had small diverticular abscess that did not require drain.  She did have 2 drains placed by interventional radiology during hospitalization.  SHe had a repeat Ct that I have personally reviewed showing improvement drain was removed.  She denies any fevers and she feels better.  She is tolerating p.o. and having bowel movements.She endorses intermittent pain on the left side, and suprapubic area at this is told mild to moderate intensity and intermittent.  She is back to baseline. She continues to smoke three quarters of a pack  Past Medical History:  Diagnosis Date   CHF (congestive heart failure) (HCC)    COPD (chronic obstructive pulmonary disease) (HCC)    Diabetes mellitus, type 2 (HCC)    GERD (gastroesophageal reflux disease)    Heart disease    Hypertension    Joint pain    Polycythemia vera ([place])    Sleep apnea    Tobacco abuse     Past Surgical History:  Procedure Laterality Date   COLONOSCOPY WITH PROPOFOL N/A 01/07/2021   Procedure: COLONOSCOPY WITH PROPOFOL;  Surgeon: [name], MD;  Location: [facility] ENDOSCOPY;  Service: Endoscopy;  Laterality: N/A;   IR CATHETER TUBE CHANGE  11/25/2020   IR CATHETER TUBE CHANGE  11/25/2020   IR RADIOLOGIST EVAL & MGMT  11/19/2020   IR RADIOLOGIST EVAL & MGMT  12/02/2020   IR RADIOLOGIST EVAL & MGMT  12/09/2020    Family History  Problem Relation Age of Onset   Breast cancer Sister        sister #1 age 49   Kidney cancer Sister        sister #1 age -58's   Breast cancer Sister        sister #2 age 21's   Kidney disease Sister        sister #2 age 15's   Diabetes Mother    Heart disease Mother        + BYPASS  SURGERY   Deep vein thrombosis Mother    Arthritis Mother    Diabetes Sister    Heart disease Sister    Hypertension Brother    Heart attack Father     Social History:  reports that she has been smoking cigarettes. She has a 10.00 pack-year smoking history. She has never used smokeless tobacco. She reports that she does not drink alcohol and does not use drugs.  Allergies: No Known Allergies  Medications reviewed.    ROS Full ROS performed and is otherwise negative other than what is stated in HPI   BP 125/69    Pulse 81    Temp 98.4 F (36.9 C)    Ht 5\' 8"  (1.727 m)    Wt 237 lb (107.5 kg)    SpO2 96%    BMI 36.04 kg/m   Physical Exam Vitals and nursing note reviewed. Exam conducted with a chaperone present.  Constitutional:      General: She is not in acute distress.    Appearance: Normal appearance. She is obese. She is not ill-appearing.  Eyes:     General: No scleral icterus.       Right eye: No  discharge.        Left eye: No discharge.  Neck:     Vascular: No carotid bruit.  Cardiovascular:     Rate and Rhythm: Normal rate and regular rhythm.     Heart sounds: No murmur heard. Pulmonary:     Effort: Pulmonary effort is normal. No respiratory distress.     Breath sounds: Normal breath sounds. No stridor. No wheezing or rhonchi.  Abdominal:     General: Abdomen is flat. There is no distension.     Palpations: Abdomen is soft. There is no mass.     Tenderness: There is abdominal tenderness. There is no guarding or rebound.     Hernia: No hernia is present.     Comments: Mild ttp suprapubic area, no rebound or peritonitis  Musculoskeletal:        General: No swelling or tenderness. Normal range of motion.     Cervical back: Normal range of motion and neck supple. No rigidity or tenderness.  Lymphadenopathy:     Cervical: No cervical adenopathy.  Skin:    General: Skin is warm and dry.     Capillary Refill: Capillary refill takes less than 2 seconds.   Neurological:     General: No focal deficit present.     Mental Status: She is alert and oriented to person, place, and time.  Psychiatric:        Mood and Affect: Mood normal.        Behavior: Behavior normal.        Thought Content: Thought content normal.        Judgment: Judgment normal.      Assessment/Plan: Stacy Cox is a 65 year old female with history of recurrent complicated diverticulitis most recently with both diverticular abscess and perihepatic abscess requiring drain placement.  She continues to have some intermittent lower abdominal pain likely as a result of her chronic diverticulitis.  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 In this Situation I definitely recommend sigmoid colectomy. Do think that she will be a good candidate for robotic approach.  Procedure discussed with the patient in detail.  Risks, benefits and possible medications including but not limited to: Bleeding, infection anastomotic leak need for ostomy, potential rate intervention and prolonged hospitalization.  She understands and wishes to proceed.  Given her heart history we will make sure that we have preoperative evaluation by cardiology.  We will also need to hold anticoagulation and depending on cards rec may need bridging. We will let them sort this out. She wishes to wait until January to get this scheduled which I think is reasonable.  Currently no need for any emergent surgical intervention at this time Have also performed extensive counseling about smoking cessation.  Actually have prescribed NicoDerm patches 21 mg.    Time of the visit was  Greater than 45 minutes  including counseling/coordination of care, placing orders and documenting appropriately.   Caroleen Hamman, MD [facility] General Surgeon

## 2021-02-13 ENCOUNTER — Other Ambulatory Visit: Payer: Self-pay

## 2021-02-13 ENCOUNTER — Other Ambulatory Visit
Admission: RE | Admit: 2021-02-13 | Discharge: 2021-02-13 | Disposition: A | Discharge status: Home / Self Care | Payer: Medicare HMO | Source: Ambulatory Visit | Attending: Surgery | Admitting: Surgery

## 2021-02-13 VITALS — Ht 68.0 in | Wt 232.0 lb

## 2021-02-13 DIAGNOSIS — Z01812 Encounter for preprocedural laboratory examination: Secondary | ICD-10-CM

## 2021-02-13 DIAGNOSIS — I5032 Chronic diastolic (congestive) heart failure: Secondary | ICD-10-CM

## 2021-02-13 DIAGNOSIS — E876 Hypokalemia: Secondary | ICD-10-CM

## 2021-02-13 NOTE — Patient Instructions (Addendum)
Your procedure is scheduled on: Thursday, January 26 Report to the Registration Desk on the 1st floor of the [facility]. To find out your arrival time, please call [phone] between 1PM - 3PM on: Wednesday, January 25  REMEMBER: Instructions that are not followed completely may result in serious medical risk, up to and including death; or upon the discretion of your surgeon and anesthesiologist your surgery may need to be rescheduled.  Do not eat food after midnight the night before surgery.  No gum chewing, lozengers or hard candies.  You may however, drink water up to 2 hours before you are scheduled to arrive for your surgery. Do not drink anything within 2 hours of your scheduled arrival time.  TAKE THESE MEDICATIONS THE MORNING OF SURGERY WITH A SIP OF WATER:  Amiodarone Carvedilol Pantoprazole - (take one the night before and one on the morning of surgery - helps to prevent nausea after surgery.)  Stop metformin 2 days prior to surgery. Last day the take metformin is Monday, January 23. Resume AFTER surgery.  Follow recommendations from Cardiologist, Pulmonologist or PCP regarding stopping Aspirin and Eliquis. According to clearance form from Dr. Laurelyn Sickle; stop apixaban (eliquis) 3 days prior to surgery. Last day to take Eliquis is Sunday, January 22. Stop aspirin 7 days prior to surgery. Last day to take aspirin is Wednesday, January 18.  One week prior to surgery: starting January 19 Stop Anti-inflammatories (NSAIDS) such as Advil, Aleve, Ibuprofen, Motrin, Naproxen, Naprosyn and Aspirin based products such as Excedrin, Goodys Powder, BC Powder. Stop ANY OVER THE COUNTER supplements until after surgery. Stop Vitamin C, Vitamin D, multiple vitamin, zinc. You may however, continue to take Tylenol if needed for pain up until the day of surgery.  No Alcohol for 24 hours before or after surgery.  No Smoking including e-cigarettes for 24 hours prior to surgery.  No chewable  tobacco products for at least 6 hours prior to surgery.  No nicotine patches on the day of surgery.  Do not use any "recreational" drugs for at least a week prior to your surgery.  Please be advised that the combination of cocaine and anesthesia may have negative outcomes, up to and including death. If you test positive for cocaine, your surgery will be cancelled.  On the morning of surgery brush your teeth with toothpaste and water, you may rinse your mouth with mouthwash if you wish. Do not swallow any toothpaste or mouthwash.  Use CHG Soap as directed on instruction sheet.  Do not wear jewelry, make-up, hairpins, clips or nail polish.  Do not wear lotions, powders, or perfumes.   Do not shave body from the neck down 48 hours prior to surgery just in case you cut yourself which could leave a site for infection.  Also, freshly shaved skin may become irritated if using the CHG soap.  Contact lenses, hearing aids and dentures may not be worn into surgery.  Do not bring valuables to the hospital. [facility] is not responsible for any missing/lost belongings or valuables.   Bowel prep and medications as directed by Dr. Dahlia Byes.   Bring your C-PAP to the hospital with you in case you may have to spend the night.   Notify your doctor if there is any change in your medical condition (cold, fever, infection).  Wear comfortable clothing (specific to your surgery type) to the hospital.  After surgery, you can help prevent lung complications by doing breathing exercises.  Take deep breaths and cough  every 1-2 hours. Your doctor may order a device called an Incentive Spirometer to help you take deep breaths. When coughing or sneezing, hold a pillow firmly against your incision with both hands. This is called splinting. Doing this helps protect your incision. It also decreases belly discomfort.  If you are being admitted to the hospital overnight, leave your suitcase in the car. After  surgery it may be brought to your room.  Please call the [facility] at [phone] if you have any questions about these instructions.  Surgery Visitation Policy:  Patients undergoing a surgery or procedure may have one family member or support person with them as long as that person is not COVID-19 positive or experiencing its symptoms.  That person may remain in the waiting area during the procedure and may rotate out with other people.  Inpatient Visitation:    Visiting hours are 7 a.m. to 8 p.m. Up to two visitors ages 16+ are allowed at one time in a patient room. The visitors may rotate out with other people during the day. Visitors must check out when they leave, or other visitors will not be allowed. One designated support person may remain overnight. The visitor must pass COVID-19 screenings, use hand sanitizer when entering and exiting the patients room and wear a mask at all times, including in the patients room. Patients must also wear a mask when staff or their visitor are in the room. Masking is required regardless of vaccination status.

## 2021-02-17 ENCOUNTER — Encounter: Payer: Self-pay | Admitting: Urgent Care

## 2021-02-17 ENCOUNTER — Other Ambulatory Visit: Payer: Self-pay

## 2021-02-17 ENCOUNTER — Encounter
Admission: RE | Admit: 2021-02-17 | Discharge: 2021-02-17 | Disposition: A | Discharge status: Home / Self Care | Payer: Medicare HMO | Source: Ambulatory Visit | Attending: Surgery | Admitting: Surgery

## 2021-02-17 DIAGNOSIS — I5032 Chronic diastolic (congestive) heart failure: Secondary | ICD-10-CM | POA: Diagnosis not present

## 2021-02-17 DIAGNOSIS — E876 Hypokalemia: Secondary | ICD-10-CM | POA: Insufficient documentation

## 2021-02-17 DIAGNOSIS — Z01812 Encounter for preprocedural laboratory examination: Secondary | ICD-10-CM | POA: Insufficient documentation

## 2021-02-17 LAB — CBC
HCT: 44.6 % (ref 36.0–46.0)
Hemoglobin: 15.4 g/dL — ABNORMAL HIGH (ref 12.0–15.0)
MCH: 32.4 pg (ref 26.0–34.0)
MCHC: 34.5 g/dL (ref 30.0–36.0)
MCV: 93.7 fL (ref 80.0–100.0)
Platelets: 293 10*3/uL (ref 150–400)
RBC: 4.76 MIL/uL (ref 3.87–5.11)
RDW: 14.3 % (ref 11.5–15.5)
WBC: 8.4 10*3/uL (ref 4.0–10.5)
nRBC: 0 % (ref 0.0–0.2)

## 2021-02-17 LAB — BASIC METABOLIC PANEL
Anion gap: 8 (ref 5–15)
BUN: 14 mg/dL (ref 8–23)
CO2: 26 mmol/L (ref 22–32)
Calcium: 9.4 mg/dL (ref 8.9–10.3)
Chloride: 106 mmol/L (ref 98–111)
Creatinine, Ser: 0.85 mg/dL (ref 0.44–1.00)
GFR, Estimated: >60 mL/min (ref >60)
Glucose, Bld: 87 mg/dL (ref 70–99)
Potassium: 3.5 mmol/L (ref 3.5–5.1)
Sodium: 140 mmol/L (ref 135–145)

## 2021-02-17 LAB — TYPE AND SCREEN
ABO/RH(D): O POS
Antibody Screen: NEGATIVE

## 2021-02-24 ENCOUNTER — Other Ambulatory Visit: Payer: Self-pay

## 2021-02-24 ENCOUNTER — Other Ambulatory Visit
Admission: RE | Admit: 2021-02-24 | Discharge: 2021-02-24 | Disposition: A | Discharge status: Home / Self Care | Payer: Medicare HMO | Source: Ambulatory Visit | Attending: Surgery | Admitting: Surgery

## 2021-02-24 DIAGNOSIS — Z01812 Encounter for preprocedural laboratory examination: Secondary | ICD-10-CM | POA: Insufficient documentation

## 2021-02-24 DIAGNOSIS — Z20822 Contact with and (suspected) exposure to covid-19: Secondary | ICD-10-CM | POA: Insufficient documentation

## 2021-02-25 LAB — SARS CORONAVIRUS 2 (TAT 6-24 HRS): SARS Coronavirus 2: NEGATIVE

## 2021-02-26 ENCOUNTER — Encounter
Admission: RE | Disposition: A | Discharge status: Home / Self Care | Payer: Self-pay | Source: Home / Self Care | Attending: Surgery

## 2021-02-26 ENCOUNTER — Inpatient Hospital Stay: Payer: Medicare HMO

## 2021-02-26 ENCOUNTER — Inpatient Hospital Stay
Admission: RE | Admit: 2021-02-26 | Discharge: 2021-03-09 | DRG: 330 | Disposition: A | Discharge status: Home / Self Care | Payer: Medicare HMO | Attending: Surgery | Admitting: Surgery

## 2021-02-26 ENCOUNTER — Other Ambulatory Visit: Payer: Self-pay

## 2021-02-26 ENCOUNTER — Encounter: Payer: Self-pay | Admitting: Surgery

## 2021-02-26 DIAGNOSIS — E860 Dehydration: Secondary | ICD-10-CM | POA: Diagnosis present

## 2021-02-26 DIAGNOSIS — K660 Peritoneal adhesions (postprocedural) (postinfection): Secondary | ICD-10-CM | POA: Diagnosis present

## 2021-02-26 DIAGNOSIS — K219 Gastro-esophageal reflux disease without esophagitis: Secondary | ICD-10-CM | POA: Diagnosis present

## 2021-02-26 DIAGNOSIS — G4730 Sleep apnea, unspecified: Secondary | ICD-10-CM | POA: Diagnosis present

## 2021-02-26 DIAGNOSIS — F17210 Nicotine dependence, cigarettes, uncomplicated: Secondary | ICD-10-CM | POA: Diagnosis present

## 2021-02-26 DIAGNOSIS — Z9049 Acquired absence of other specified parts of digestive tract: Secondary | ICD-10-CM

## 2021-02-26 DIAGNOSIS — R0902 Hypoxemia: Secondary | ICD-10-CM | POA: Diagnosis not present

## 2021-02-26 DIAGNOSIS — Z79899 Other long term (current) drug therapy: Secondary | ICD-10-CM

## 2021-02-26 DIAGNOSIS — D45 Polycythemia vera: Secondary | ICD-10-CM | POA: Diagnosis present

## 2021-02-26 DIAGNOSIS — Z841 Family history of disorders of kidney and ureter: Secondary | ICD-10-CM

## 2021-02-26 DIAGNOSIS — Y733 Surgical instruments, materials and gastroenterology and urology devices (including sutures) associated with adverse incidents: Secondary | ICD-10-CM | POA: Diagnosis not present

## 2021-02-26 DIAGNOSIS — D72829 Elevated white blood cell count, unspecified: Secondary | ICD-10-CM | POA: Diagnosis not present

## 2021-02-26 DIAGNOSIS — I10 Essential (primary) hypertension: Secondary | ICD-10-CM | POA: Diagnosis present

## 2021-02-26 DIAGNOSIS — Z8249 Family history of ischemic heart disease and other diseases of the circulatory system: Secondary | ICD-10-CM | POA: Diagnosis not present

## 2021-02-26 DIAGNOSIS — Z20822 Contact with and (suspected) exposure to covid-19: Secondary | ICD-10-CM | POA: Diagnosis present

## 2021-02-26 DIAGNOSIS — Y838 Other surgical procedures as the cause of abnormal reaction of the patient, or of later complication, without mention of misadventure at the time of the procedure: Secondary | ICD-10-CM | POA: Diagnosis not present

## 2021-02-26 DIAGNOSIS — R109 Unspecified abdominal pain: Secondary | ICD-10-CM | POA: Diagnosis present

## 2021-02-26 DIAGNOSIS — Z833 Family history of diabetes mellitus: Secondary | ICD-10-CM

## 2021-02-26 DIAGNOSIS — K5732 Diverticulitis of large intestine without perforation or abscess without bleeding: Secondary | ICD-10-CM | POA: Diagnosis not present

## 2021-02-26 DIAGNOSIS — Z8261 Family history of arthritis: Secondary | ICD-10-CM | POA: Diagnosis not present

## 2021-02-26 DIAGNOSIS — K5720 Diverticulitis of large intestine with perforation and abscess without bleeding: Principal | ICD-10-CM | POA: Diagnosis present

## 2021-02-26 DIAGNOSIS — J449 Chronic obstructive pulmonary disease, unspecified: Secondary | ICD-10-CM | POA: Diagnosis present

## 2021-02-26 DIAGNOSIS — Z7901 Long term (current) use of anticoagulants: Secondary | ICD-10-CM | POA: Diagnosis not present

## 2021-02-26 DIAGNOSIS — E876 Hypokalemia: Secondary | ICD-10-CM | POA: Diagnosis not present

## 2021-02-26 DIAGNOSIS — N179 Acute kidney failure, unspecified: Secondary | ICD-10-CM | POA: Diagnosis present

## 2021-02-26 DIAGNOSIS — Z803 Family history of malignant neoplasm of breast: Secondary | ICD-10-CM

## 2021-02-26 DIAGNOSIS — R188 Other ascites: Secondary | ICD-10-CM

## 2021-02-26 DIAGNOSIS — K9189 Other postprocedural complications and disorders of digestive system: Secondary | ICD-10-CM

## 2021-02-26 DIAGNOSIS — E119 Type 2 diabetes mellitus without complications: Secondary | ICD-10-CM | POA: Diagnosis present

## 2021-02-26 DIAGNOSIS — Z8051 Family history of malignant neoplasm of kidney: Secondary | ICD-10-CM | POA: Diagnosis not present

## 2021-02-26 DIAGNOSIS — Z7984 Long term (current) use of oral hypoglycemic drugs: Secondary | ICD-10-CM | POA: Diagnosis not present

## 2021-02-26 LAB — CBC
HCT: 43.3 % (ref 36.0–46.0)
Hemoglobin: 14.2 g/dL (ref 12.0–15.0)
MCH: 32.1 pg (ref 26.0–34.0)
MCHC: 32.8 g/dL (ref 30.0–36.0)
MCV: 98 fL (ref 80.0–100.0)
Platelets: 175 10*3/uL (ref 150–400)
RBC: 4.42 MIL/uL (ref 3.87–5.11)
RDW: 13.7 % (ref 11.5–15.5)
WBC: 11.9 10*3/uL — ABNORMAL HIGH (ref 4.0–10.5)
nRBC: 0 % (ref 0.0–0.2)

## 2021-02-26 LAB — GLUCOSE, CAPILLARY
Glucose-Capillary: 133 mg/dL — ABNORMAL HIGH (ref 70–99)
Glucose-Capillary: 139 mg/dL — ABNORMAL HIGH (ref 70–99)
Glucose-Capillary: 155 mg/dL — ABNORMAL HIGH (ref 70–99)
Glucose-Capillary: 165 mg/dL — ABNORMAL HIGH (ref 70–99)

## 2021-02-26 LAB — CREATININE, SERUM
Creatinine, Ser: 1.12 mg/dL — ABNORMAL HIGH (ref 0.44–1.00)
GFR, Estimated: 55 mL/min — ABNORMAL LOW (ref >60)

## 2021-02-26 LAB — ABO/RH: ABO/RH(D): O POS

## 2021-02-26 SURGERY — COLECTOMY, SIGMOID, ROBOT-ASSISTED
Anesthesia: General

## 2021-02-26 MED ORDER — CARVEDILOL PHOSPHATE ER 10 MG PO CP24
20.0000 mg | ORAL_CAPSULE | Freq: Every day | ORAL | Status: DC
  Administered 2021-02-27 – 2021-03-09 (×11): 20 mg via ORAL
  Filled 2021-02-26 (×11): qty 2

## 2021-02-26 MED ORDER — SODIUM CHLORIDE (PF) 0.9 % IJ SOLN
INTRAMUSCULAR | Status: AC
  Filled 2021-02-26: qty 50

## 2021-02-26 MED ORDER — SODIUM CHLORIDE 0.9 % IV SOLN
2.0000 g | Freq: Two times a day (BID) | INTRAVENOUS | Status: AC
  Administered 2021-02-26 – 2021-02-27 (×2): 2 g via INTRAVENOUS
  Filled 2021-02-26 (×3): qty 2

## 2021-02-26 MED ORDER — BUPIVACAINE-EPINEPHRINE (PF) 0.25% -1:200000 IJ SOLN
INTRAMUSCULAR | Status: AC
  Filled 2021-02-26: qty 30

## 2021-02-26 MED ORDER — FENTANYL CITRATE (PF) 100 MCG/2ML IJ SOLN
INTRAMUSCULAR | Status: AC
  Filled 2021-02-26: qty 2

## 2021-02-26 MED ORDER — KETOROLAC TROMETHAMINE 15 MG/ML IJ SOLN
15.0000 mg | Freq: Four times a day (QID) | INTRAMUSCULAR | Status: AC
  Administered 2021-02-26 – 2021-03-03 (×19): 15 mg via INTRAVENOUS
  Filled 2021-02-26 (×18): qty 1

## 2021-02-26 MED ORDER — ALBUMIN HUMAN 5 % IV SOLN
INTRAVENOUS | Status: AC
  Filled 2021-02-26: qty 250

## 2021-02-26 MED ORDER — FENTANYL CITRATE (PF) 100 MCG/2ML IJ SOLN: 25.0000 ug | INTRAMUSCULAR | Status: DC | PRN

## 2021-02-26 MED ORDER — CHLORHEXIDINE GLUCONATE CLOTH 2 % EX PADS
6.0000 | MEDICATED_PAD | Freq: Every day | CUTANEOUS | Status: DC
  Administered 2021-02-27 – 2021-03-01 (×2): 6 via TOPICAL

## 2021-02-26 MED ORDER — CHLORHEXIDINE GLUCONATE 0.12 % MT SOLN
OROMUCOSAL | Status: AC
  Administered 2021-02-26: 15 mL via OROMUCOSAL
  Filled 2021-02-26: qty 15

## 2021-02-26 MED ORDER — MIDAZOLAM HCL 2 MG/2ML IJ SOLN
INTRAMUSCULAR | Status: DC | PRN
  Administered 2021-02-26: 2 mg via INTRAVENOUS

## 2021-02-26 MED ORDER — GABAPENTIN 300 MG PO CAPS: 300.0000 mg | ORAL_CAPSULE | ORAL | Status: AC

## 2021-02-26 MED ORDER — OXYCODONE HCL 5 MG PO TABS
5.0000 mg | ORAL_TABLET | ORAL | Status: DC | PRN
  Administered 2021-03-01 – 2021-03-02 (×5): 10 mg via ORAL
  Administered 2021-03-03: 5 mg via ORAL
  Administered 2021-03-04 (×2): 10 mg via ORAL
  Administered 2021-03-04: 5 mg via ORAL
  Administered 2021-03-05 (×4): 10 mg via ORAL
  Administered 2021-03-06: 5 mg via ORAL
  Administered 2021-03-06 – 2021-03-07 (×3): 10 mg via ORAL
  Administered 2021-03-07: 5 mg via ORAL
  Administered 2021-03-07 – 2021-03-09 (×6): 10 mg via ORAL
  Filled 2021-02-26 (×9): qty 2
  Filled 2021-02-26: qty 1
  Filled 2021-02-26: qty 2
  Filled 2021-02-26 (×2): qty 1
  Filled 2021-02-26 (×11): qty 2

## 2021-02-26 MED ORDER — MIDAZOLAM HCL 2 MG/2ML IJ SOLN
INTRAMUSCULAR | Status: AC
  Filled 2021-02-26: qty 2

## 2021-02-26 MED ORDER — HYDROMORPHONE HCL 1 MG/ML IJ SOLN
INTRAMUSCULAR | Status: DC | PRN
  Administered 2021-02-26 (×2): .5 mg via INTRAVENOUS

## 2021-02-26 MED ORDER — ONDANSETRON HCL 4 MG/2ML IJ SOLN: 4.0000 mg | Freq: Four times a day (QID) | INTRAMUSCULAR | Status: DC | PRN

## 2021-02-26 MED ORDER — CELECOXIB 200 MG PO CAPS
ORAL_CAPSULE | ORAL | Status: AC
  Administered 2021-02-26: 200 mg
  Filled 2021-02-26: qty 1

## 2021-02-26 MED ORDER — PHENYLEPHRINE HCL-NACL 20-0.9 MG/250ML-% IV SOLN
INTRAVENOUS | Status: AC
  Filled 2021-02-26: qty 250

## 2021-02-26 MED ORDER — PHENYLEPHRINE 40 MCG/ML (10ML) SYRINGE FOR IV PUSH (FOR BLOOD PRESSURE SUPPORT)
PREFILLED_SYRINGE | INTRAVENOUS | Status: DC | PRN
Start: 2021-02-26 — End: 2021-02-26
  Administered 2021-02-26: 80 ug via INTRAVENOUS
  Administered 2021-02-26 (×2): 50 ug via INTRAVENOUS
  Administered 2021-02-26: 100 ug via INTRAVENOUS

## 2021-02-26 MED ORDER — LIDOCAINE HCL (CARDIAC) PF 100 MG/5ML IV SOSY
PREFILLED_SYRINGE | INTRAVENOUS | Status: DC | PRN
  Administered 2021-02-26: 50 mg via INTRAVENOUS

## 2021-02-26 MED ORDER — OXYCODONE HCL 5 MG/5ML PO SOLN: 5.0000 mg | Freq: Once | ORAL | Status: DC | PRN

## 2021-02-26 MED ORDER — SUGAMMADEX SODIUM 500 MG/5ML IV SOLN
INTRAVENOUS | Status: DC | PRN
  Administered 2021-02-26: 400 mg via INTRAVENOUS

## 2021-02-26 MED ORDER — MORPHINE SULFATE (PF) 2 MG/ML IV SOLN
2.0000 mg | INTRAVENOUS | Status: DC | PRN
  Administered 2021-03-01 – 2021-03-08 (×4): 2 mg via INTRAVENOUS
  Filled 2021-02-26 (×4): qty 1

## 2021-02-26 MED ORDER — ACETAMINOPHEN 500 MG PO TABS
1000.0000 mg | ORAL_TABLET | Freq: Four times a day (QID) | ORAL | Status: DC
  Administered 2021-02-26 – 2021-03-09 (×37): 1000 mg via ORAL
  Filled 2021-02-26 (×39): qty 2

## 2021-02-26 MED ORDER — ALVIMOPAN 12 MG PO CAPS: 12.0000 mg | ORAL_CAPSULE | ORAL | Status: DC

## 2021-02-26 MED ORDER — SODIUM CHLORIDE 0.9 % IV SOLN
2.0000 g | INTRAVENOUS | Status: AC
  Administered 2021-02-26 (×2): 2 g via INTRAVENOUS

## 2021-02-26 MED ORDER — ALVIMOPAN 12 MG PO CAPS
ORAL_CAPSULE | ORAL | Status: AC
  Administered 2021-02-26: 12 mg via ORAL
  Filled 2021-02-26: qty 1

## 2021-02-26 MED ORDER — FENTANYL CITRATE (PF) 100 MCG/2ML IJ SOLN
INTRAMUSCULAR | Status: DC | PRN
  Administered 2021-02-26: 100 ug via INTRAVENOUS

## 2021-02-26 MED ORDER — ALVIMOPAN 12 MG PO CAPS: 12.0000 mg | ORAL_CAPSULE | ORAL | Status: AC

## 2021-02-26 MED ORDER — KETAMINE HCL 50 MG/5ML IJ SOSY
PREFILLED_SYRINGE | INTRAMUSCULAR | Status: AC
  Filled 2021-02-26: qty 5

## 2021-02-26 MED ORDER — ALBUMIN HUMAN 25 % IV SOLN
12.5000 g | Freq: Once | INTRAVENOUS | Status: DC
  Filled 2021-02-26 (×2): qty 50

## 2021-02-26 MED ORDER — SODIUM CHLORIDE 0.9 % IV SOLN
INTRAVENOUS | Status: DC
Start: 2021-02-26 — End: 2021-03-01

## 2021-02-26 MED ORDER — INSULIN ASPART 100 UNIT/ML IJ SOLN
0.0000 [IU] | Freq: Three times a day (TID) | INTRAMUSCULAR | Status: DC
  Administered 2021-02-27: 13:00:00 3 [IU] via SUBCUTANEOUS
  Administered 2021-02-27 – 2021-02-28 (×2): 2 [IU] via SUBCUTANEOUS
  Administered 2021-03-05: 3 [IU] via SUBCUTANEOUS
  Administered 2021-03-06 – 2021-03-09 (×3): 2 [IU] via SUBCUTANEOUS
  Filled 2021-02-26 (×10): qty 1

## 2021-02-26 MED ORDER — ORAL CARE MOUTH RINSE: 15.0000 mL | Freq: Once | OROMUCOSAL | Status: AC

## 2021-02-26 MED ORDER — SUGAMMADEX SODIUM 500 MG/5ML IV SOLN
INTRAVENOUS | Status: AC
  Filled 2021-02-26: qty 5

## 2021-02-26 MED ORDER — LACTATED RINGERS IV SOLN: INTRAVENOUS | Status: DC | PRN

## 2021-02-26 MED ORDER — INDOCYANINE GREEN 25 MG IV SOLR
INTRAVENOUS | Status: DC | PRN
  Administered 2021-02-26: 5 mg via INTRAVENOUS

## 2021-02-26 MED ORDER — ACETAMINOPHEN 500 MG PO TABS: 1000.0000 mg | ORAL_TABLET | ORAL | Status: AC

## 2021-02-26 MED ORDER — ROCURONIUM BROMIDE 10 MG/ML (PF) SYRINGE
PREFILLED_SYRINGE | INTRAVENOUS | Status: AC
  Filled 2021-02-26: qty 10

## 2021-02-26 MED ORDER — CELECOXIB 200 MG PO CAPS: 200.0000 mg | ORAL_CAPSULE | ORAL | Status: AC

## 2021-02-26 MED ORDER — CHLORHEXIDINE GLUCONATE CLOTH 2 % EX PADS
6.0000 | MEDICATED_PAD | Freq: Once | CUTANEOUS | Status: AC
  Administered 2021-02-26: 6 via TOPICAL

## 2021-02-26 MED ORDER — ALBUMIN HUMAN 5 % IV SOLN: INTRAVENOUS | Status: DC | PRN

## 2021-02-26 MED ORDER — DEXAMETHASONE SODIUM PHOSPHATE 10 MG/ML IJ SOLN
INTRAMUSCULAR | Status: DC | PRN
  Administered 2021-02-26: 8 mg via INTRAVENOUS

## 2021-02-26 MED ORDER — OXYCODONE HCL 5 MG PO TABS: 5.0000 mg | ORAL_TABLET | Freq: Once | ORAL | Status: DC | PRN

## 2021-02-26 MED ORDER — SODIUM CHLORIDE 0.9 % IV SOLN
INTRAVENOUS | Status: AC
  Filled 2021-02-26: qty 2

## 2021-02-26 MED ORDER — ALBUMIN HUMAN 25 % IV SOLN
12.5000 g | Freq: Once | INTRAVENOUS | Status: AC | PRN
  Administered 2021-02-26: 12.5 g via INTRAVENOUS
  Filled 2021-02-26: qty 50

## 2021-02-26 MED ORDER — PHENYLEPHRINE HCL-NACL 20-0.9 MG/250ML-% IV SOLN
INTRAVENOUS | Status: DC | PRN
Start: 2021-02-26 — End: 2021-02-26
  Administered 2021-02-26: 30 ug/min via INTRAVENOUS

## 2021-02-26 MED ORDER — SODIUM CHLORIDE (PF) 0.9 % IJ SOLN
INTRAMUSCULAR | Status: DC | PRN
  Administered 2021-02-26: 100 mL

## 2021-02-26 MED ORDER — PROPOFOL 10 MG/ML IV BOLUS
INTRAVENOUS | Status: AC
  Filled 2021-02-26: qty 20

## 2021-02-26 MED ORDER — 0.9 % SODIUM CHLORIDE (POUR BTL) OPTIME
TOPICAL | Status: DC | PRN
  Administered 2021-02-26: 100 mL

## 2021-02-26 MED ORDER — INSULIN ASPART 100 UNIT/ML IJ SOLN
6.0000 [IU] | Freq: Three times a day (TID) | INTRAMUSCULAR | Status: DC
  Administered 2021-02-27 – 2021-03-07 (×15): 6 [IU] via SUBCUTANEOUS
  Filled 2021-02-26 (×12): qty 1

## 2021-02-26 MED ORDER — PANTOPRAZOLE SODIUM 40 MG IV SOLR
40.0000 mg | Freq: Two times a day (BID) | INTRAVENOUS | Status: DC
  Administered 2021-02-26 – 2021-02-28 (×4): 40 mg via INTRAVENOUS
  Filled 2021-02-26 (×4): qty 40

## 2021-02-26 MED ORDER — PROPOFOL 10 MG/ML IV BOLUS
INTRAVENOUS | Status: DC | PRN
  Administered 2021-02-26: 80 mg via INTRAVENOUS

## 2021-02-26 MED ORDER — HYDROMORPHONE HCL 1 MG/ML IJ SOLN
INTRAMUSCULAR | Status: AC
  Filled 2021-02-26: qty 1

## 2021-02-26 MED ORDER — ONDANSETRON HCL 4 MG/2ML IJ SOLN
INTRAMUSCULAR | Status: AC
  Filled 2021-02-26: qty 2

## 2021-02-26 MED ORDER — CHLORHEXIDINE GLUCONATE 0.12 % MT SOLN: 15.0000 mL | Freq: Once | OROMUCOSAL | Status: AC

## 2021-02-26 MED ORDER — KETOROLAC TROMETHAMINE 15 MG/ML IJ SOLN
INTRAMUSCULAR | Status: AC
  Filled 2021-02-26: qty 1

## 2021-02-26 MED ORDER — PREGABALIN 50 MG PO CAPS
100.0000 mg | ORAL_CAPSULE | Freq: Three times a day (TID) | ORAL | Status: DC
  Administered 2021-02-26 – 2021-03-09 (×32): 100 mg via ORAL
  Filled 2021-02-26 (×32): qty 2

## 2021-02-26 MED ORDER — HEPARIN SODIUM (PORCINE) 5000 UNIT/ML IJ SOLN
5000.0000 [IU] | Freq: Three times a day (TID) | INTRAMUSCULAR | Status: AC
  Administered 2021-02-27 (×3): 5000 [IU] via SUBCUTANEOUS
  Filled 2021-02-26 (×3): qty 1

## 2021-02-26 MED ORDER — BUPIVACAINE HCL (PF) 0.25 % IJ SOLN
INTRAMUSCULAR | Status: AC
  Filled 2021-02-26: qty 30

## 2021-02-26 MED ORDER — DEXMEDETOMIDINE (PRECEDEX) IN NS 20 MCG/5ML (4 MCG/ML) IV SYRINGE
PREFILLED_SYRINGE | INTRAVENOUS | Status: DC | PRN
  Administered 2021-02-26 (×5): 4 ug via INTRAVENOUS

## 2021-02-26 MED ORDER — STERILE WATER FOR IRRIGATION IR SOLN
Status: DC | PRN
  Administered 2021-02-26: 200 mL

## 2021-02-26 MED ORDER — ONDANSETRON HCL 4 MG/2ML IJ SOLN: 4.0000 mg | Freq: Once | INTRAMUSCULAR | Status: DC | PRN

## 2021-02-26 MED ORDER — KETAMINE HCL 10 MG/ML IJ SOLN
INTRAMUSCULAR | Status: DC | PRN
  Administered 2021-02-26 (×2): 25 mg via INTRAVENOUS

## 2021-02-26 MED ORDER — APREPITANT 40 MG PO CAPS
40.0000 mg | ORAL_CAPSULE | Freq: Once | ORAL | Status: AC
Start: 2021-02-26 — End: 2021-02-26

## 2021-02-26 MED ORDER — ACETAMINOPHEN 500 MG PO TABS
ORAL_TABLET | ORAL | Status: AC
  Administered 2021-02-26: 1000 mg via ORAL
  Filled 2021-02-26: qty 2

## 2021-02-26 MED ORDER — ACETAMINOPHEN 10 MG/ML IV SOLN: 1000.0000 mg | Freq: Once | INTRAVENOUS | Status: DC | PRN

## 2021-02-26 MED ORDER — ONDANSETRON 4 MG PO TBDP: 4.0000 mg | ORAL_TABLET | Freq: Four times a day (QID) | ORAL | Status: DC | PRN

## 2021-02-26 MED ORDER — SODIUM CHLORIDE 0.9 % IV SOLN: INTRAVENOUS | Status: DC

## 2021-02-26 MED ORDER — GABAPENTIN 300 MG PO CAPS
ORAL_CAPSULE | ORAL | Status: AC
  Administered 2021-02-26: 300 mg via ORAL
  Filled 2021-02-26: qty 1

## 2021-02-26 MED ORDER — ROCURONIUM BROMIDE 100 MG/10ML IV SOLN
INTRAVENOUS | Status: DC | PRN
  Administered 2021-02-26: 10 mg via INTRAVENOUS
  Administered 2021-02-26: 40 mg via INTRAVENOUS
  Administered 2021-02-26: 20 mg via INTRAVENOUS
  Administered 2021-02-26: 100 mg via INTRAVENOUS

## 2021-02-26 MED ORDER — SODIUM CHLORIDE 0.9 % IR SOLN
Status: DC | PRN
  Administered 2021-02-26: 1500 mL

## 2021-02-26 MED ORDER — INSULIN ASPART 100 UNIT/ML IJ SOLN: 0.0000 [IU] | Freq: Every day | INTRAMUSCULAR | Status: DC

## 2021-02-26 SURGICAL SUPPLY — 91 items
BAG LAPAROSCOPIC 12 15 PORT 16 (BASKET) IMPLANT
BAG RETRIEVAL 12/15 (BASKET) ×2
BULB RESERV EVAC DRAIN JP 100C (MISCELLANEOUS) ×1 IMPLANT
CANNULA REDUC XI 12-8 STAPL (CANNULA) ×1
CANNULA REDUCER 12-8 DVNC XI (CANNULA) ×1 IMPLANT
CATH ROBINSON RED A/P 16FR (CATHETERS) ×2 IMPLANT
COVER MAYO STAND REUSABLE (DRAPES) ×2 IMPLANT
COVER TIP SHEARS 8 DVNC (MISCELLANEOUS) ×1 IMPLANT
COVER TIP SHEARS 8MM DA VINCI (MISCELLANEOUS) ×1
DERMABOND ADVANCED (GAUZE/BANDAGES/DRESSINGS) ×1
DERMABOND ADVANCED .7 DNX12 (GAUZE/BANDAGES/DRESSINGS) ×1 IMPLANT
DRAIN CHANNEL JP 19F (MISCELLANEOUS) ×1 IMPLANT
DRAPE ARM DVNC X/XI (DISPOSABLE) ×4 IMPLANT
DRAPE COLUMN DVNC XI (DISPOSABLE) ×1 IMPLANT
DRAPE DA VINCI XI ARM (DISPOSABLE) ×4
DRAPE DA VINCI XI COLUMN (DISPOSABLE) ×1
DRAPE INCISE IOBAN 66X45 STRL (DRAPES) ×1 IMPLANT
DRAPE LEGGINS SURG 28X43 STRL (DRAPES) ×2 IMPLANT
DRAPE UNDER BUTTOCK W/FLU (DRAPES) ×2 IMPLANT
ELECT BLADE 6.5 EXT (BLADE) ×2 IMPLANT
ELECT CAUTERY BLADE 6.4 (BLADE) ×2 IMPLANT
ELECT REM PT RETURN 9FT ADLT (ELECTROSURGICAL) ×2
ELECTRODE REM PT RTRN 9FT ADLT (ELECTROSURGICAL) ×1 IMPLANT
GLOVE SURG ENC MOIS LTX SZ7 (GLOVE) ×6 IMPLANT
GOWN STRL REUS W/ TWL LRG LVL3 (GOWN DISPOSABLE) ×5 IMPLANT
GOWN STRL REUS W/TWL LRG LVL3 (GOWN DISPOSABLE) ×5
GRASPER LAPSCPC 5X45 DSP (INSTRUMENTS) ×2 IMPLANT
HANDLE YANKAUER SUCT BULB TIP (MISCELLANEOUS) ×2 IMPLANT
IRRIGATION STRYKERFLOW (MISCELLANEOUS) ×1 IMPLANT
IRRIGATOR STRYKERFLOW (MISCELLANEOUS) ×2
IV NS 1000ML (IV SOLUTION) ×1
IV NS 1000ML BAXH (IV SOLUTION) ×1 IMPLANT
KIT IMAGING PINPOINTPAQ (MISCELLANEOUS) ×1 IMPLANT
KIT PINK PAD W/HEAD ARE REST (MISCELLANEOUS) ×2
KIT PINK PAD W/HEAD ARM REST (MISCELLANEOUS) ×1 IMPLANT
LABEL OR SOLS (LABEL) ×2 IMPLANT
MANIFOLD NEPTUNE II (INSTRUMENTS) ×2 IMPLANT
NEEDLE HYPO 22GX1.5 SAFETY (NEEDLE) ×2 IMPLANT
NS IRRIG 500ML POUR BTL (IV SOLUTION) ×2 IMPLANT
OBTURATOR OPTICAL STANDARD 8MM (TROCAR) ×1
OBTURATOR OPTICAL STND 8 DVNC (TROCAR) ×1
OBTURATOR OPTICALSTD 8 DVNC (TROCAR) ×1 IMPLANT
PACK COLON CLEAN CLOSURE (MISCELLANEOUS) ×2 IMPLANT
PACK LAP CHOLECYSTECTOMY (MISCELLANEOUS) ×2 IMPLANT
PAD PREP 24X41 OB/GYN DISP (PERSONAL CARE ITEMS) ×2 IMPLANT
PENCIL ELECTRO HAND CTR (MISCELLANEOUS) ×2 IMPLANT
PORT ACCESS TROCAR AIRSEAL 5 (TROCAR) ×2 IMPLANT
RELOAD STAPLE 60 3.5 BLU DVNC (STAPLE) IMPLANT
RELOAD STAPLER 3.5X60 BLU DVNC (STAPLE) ×3 IMPLANT
SEAL CANN UNIV 5-8 DVNC XI (MISCELLANEOUS) ×3 IMPLANT
SEAL XI 5MM-8MM UNIVERSAL (MISCELLANEOUS) ×3
SEALER VESSEL DA VINCI XI (MISCELLANEOUS) ×1
SEALER VESSEL EXT DVNC XI (MISCELLANEOUS) ×1 IMPLANT
SET TRI-LUMEN FLTR TB AIRSEAL (TUBING) ×2 IMPLANT
SHEARS HARMONIC 9CM CVD (BLADE) ×1 IMPLANT
SHEARS HARMONIC ACE PLUS 36CM (ENDOMECHANICALS) ×1 IMPLANT
SOL PREP PVP 2OZ (MISCELLANEOUS) ×2
SOLUTION ELECTROLUBE (MISCELLANEOUS) ×2 IMPLANT
SOLUTION PREP PVP 2OZ (MISCELLANEOUS) ×1 IMPLANT
SPONGE DRAIN TRACH 4X4 STRL 2S (GAUZE/BANDAGES/DRESSINGS) ×1 IMPLANT
SPONGE T-LAP 18X18 ~~LOC~~+RFID (SPONGE) ×8 IMPLANT
SPONGE T-LAP 4X18 ~~LOC~~+RFID (SPONGE) ×2 IMPLANT
STAPLER 60 DA VINCI SURE FORM (STAPLE) ×1
STAPLER 60 SUREFORM DVNC (STAPLE) IMPLANT
STAPLER CANNULA SEAL DVNC XI (STAPLE) ×1 IMPLANT
STAPLER CANNULA SEAL XI (STAPLE) ×1
STAPLER CIRCULAR MANUAL XL 29 (STAPLE) ×1 IMPLANT
STAPLER RELOAD 3.5X60 BLU DVNC (STAPLE) ×3
STAPLER RELOAD 3.5X60 BLUE (STAPLE) ×3
SURGILUBE 2OZ TUBE FLIPTOP (MISCELLANEOUS) ×2 IMPLANT
SUT ETHILON 3-0 FS-10 30 BLK (SUTURE) ×2
SUT MNCRL 4-0 (SUTURE) ×2
SUT MNCRL 4-0 27XMFL (SUTURE) ×2
SUT MNCRL AB 4-0 PS2 18 (SUTURE) ×2 IMPLANT
SUT PDS AB 0 CT1 27 (SUTURE) ×4 IMPLANT
SUT SILK 2 0SH CR/8 30 (SUTURE) ×1 IMPLANT
SUT SILK 3-0 (SUTURE) ×1 IMPLANT
SUT VIC AB 2-0 SH 27 (SUTURE) ×2
SUT VIC AB 2-0 SH 27XBRD (SUTURE) ×1 IMPLANT
SUTURE EHLN 3-0 FS-10 30 BLK (SUTURE) IMPLANT
SUTURE MNCRL 4-0 27XMF (SUTURE) IMPLANT
SYR 20ML LL LF (SYRINGE) ×4 IMPLANT
SYR TOOMEY IRRIG 70ML (MISCELLANEOUS) ×2
SYRINGE TOOMEY IRRIG 70ML (MISCELLANEOUS) ×1 IMPLANT
SYS LAPSCP GELPORT 120MM (MISCELLANEOUS) ×2
SYS TROCAR 1.5-3 SLV ABD GEL (ENDOMECHANICALS) ×2
SYSTEM LAPSCP GELPORT 120MM (MISCELLANEOUS) IMPLANT
SYSTEM TROCR 1.5-3 SLV ABD GEL (ENDOMECHANICALS) ×1 IMPLANT
TRAY FOLEY SLVR 16FR LF STAT (SET/KITS/TRAYS/PACK) ×2 IMPLANT
TROCAR XCEL NON-BLD 5MMX100MML (ENDOMECHANICALS) ×1 IMPLANT
WATER STERILE IRR 500ML POUR (IV SOLUTION) ×2 IMPLANT

## 2021-02-26 NOTE — Progress Notes (Signed)
Patient awake, alert. First question:  "do I have a colostomy bag"  Reviewed procedure with patient, updated daughter: Candace. Right radial a-line removed without event, no hematoma noted, pulses intact, ext warm to touch. Indwelling foley catheter intact and patent, tea colored/amber urine, cloudy. Anesthesia aware of fluid I&O's. Afebrile, vitals at baseline for patient.  JP drain intact RLQ, patent. Will continue to monitor.

## 2021-02-26 NOTE — Anesthesia Preprocedure Evaluation (Addendum)
Anesthesia Evaluation  Patient identified by MRN, date of birth, ID band Patient awake    Reviewed: Allergy & Precautions, NPO status , Patient's Chart, lab work & pertinent test results  History of Anesthesia Complications Negative for: history of anesthetic complications  Airway Mallampati: II  TM Distance: >3 FB Neck ROM: Full    Dental  (+) Poor Dentition, Partial Upper   Pulmonary sleep apnea and Continuous Positive Airway Pressure Ventilation , COPD, Current Smoker and Patient abstained from smoking.,    Pulmonary exam normal breath sounds clear to auscultation       Cardiovascular Exercise Tolerance: Good METShypertension, Pt. on medications +CHF  (-) CAD and (-) Past MI (-) dysrhythmias  Rhythm:Regular Rate:Normal - Systolic murmurs TTE 2956: 1. Left ventricular ejection fraction, by estimation, is 25 to 30%. The  left ventricle has severely decreased function. The left ventricle  demonstrates global hypokinesis. The left ventricular internal cavity size  was severely dilated. There is mild  concentric left ventricular hypertrophy. Left ventricular diastolic  parameters are consistent with Grade I diastolic dysfunction (impaired  relaxation).  2. Right ventricular systolic function is moderately reduced. The right  ventricular size is mildly enlarged. Mildly increased right ventricular  wall thickness.  3. Left atrial size was severely dilated.  4. Right atrial size was severely dilated.  5. The mitral valve is grossly normal. Trivial mitral valve  regurgitation.  6. The aortic valve is grossly normal. Aortic valve regurgitation is not  visualized.    Neuro/Psych negative neurological ROS  negative psych ROS   GI/Hepatic GERD  ,(+)     (-) substance abuse  ,   Endo/Other  diabetes, Well Controlled  Renal/GU negative Renal ROS     Musculoskeletal   Abdominal   Peds  Hematology   Anesthesia  Other Findings Past Medical History: No date: CHF (congestive heart failure) (HCC) No date: COPD (chronic obstructive pulmonary disease) (HCC) No date: Diabetes mellitus, type 2 (HCC) No date: GERD (gastroesophageal reflux disease) No date: Heart disease No date: Hypertension No date: Joint pain No date: Polycythemia vera ([place]) No date: Sleep apnea No date: Tobacco abuse  Reproductive/Obstetrics                                                             Anesthesia Evaluation  Patient identified by MRN, date of birth, ID band Patient awake    Reviewed: Allergy & Precautions, NPO status , Patient's Chart, lab work & pertinent test results  History of Anesthesia Complications Negative for: history of anesthetic complications  Airway Mallampati: III  TM Distance: >3 FB Neck ROM: full    Dental  (+) Chipped, Poor Dentition, Missing   Pulmonary neg shortness of breath, sleep apnea , COPD, Current Smoker and Patient abstained from smoking.,    Pulmonary exam normal        Cardiovascular Exercise Tolerance: Good hypertension, (-) angina+CHF  Normal cardiovascular exam     Neuro/Psych negative neurological ROS  negative psych ROS   GI/Hepatic Neg liver ROS, GERD  Medicated and Controlled,  Endo/Other  diabetes, Type 2  Renal/GU negative Renal ROS  negative genitourinary   Musculoskeletal   Abdominal   Peds  Hematology negative hematology ROS (+)   Anesthesia Other Findings Past Medical History: No date: CHF (congestive  heart failure) (HCC) No date: COPD (chronic obstructive pulmonary disease) (HCC) No date: Diabetes mellitus, type 2 (HCC) No date: GERD (gastroesophageal reflux disease) No date: Heart disease No date: Hypertension No date: Joint pain No date: Polycythemia vera ([place]) No date: Sleep apnea No date: Tobacco abuse  Past Surgical History: 11/25/2020: IR CATHETER TUBE CHANGE 11/25/2020: IR CATHETER TUBE  CHANGE 11/19/2020: IR RADIOLOGIST EVAL & MGMT 12/02/2020: IR RADIOLOGIST EVAL & MGMT 12/09/2020: IR RADIOLOGIST EVAL & MGMT     Reproductive/Obstetrics negative OB ROS                             Anesthesia Physical Anesthesia Plan  ASA: 3  Anesthesia Plan: General   Post-op Pain Management:    Induction: Intravenous  PONV Risk Score and Plan: Propofol infusion and TIVA  Airway Management Planned: Natural Airway and Nasal Cannula  Additional Equipment:   Intra-op Plan:   Post-operative Plan:   Informed Consent: I have reviewed the patients History and Physical, chart, labs and discussed the procedure including the risks, benefits and alternatives for the proposed anesthesia with the patient or authorized representative who has indicated his/her understanding and acceptance.     Dental Advisory Given  Plan Discussed with: Anesthesiologist, CRNA and Surgeon  Anesthesia Plan Comments: (Patient consented for risks of anesthesia including but not limited to:  - adverse reactions to medications - risk of airway placement if required - damage to eyes, teeth, lips or other oral mucosa - nerve damage due to positioning  - sore throat or hoarseness - Damage to heart, brain, nerves, lungs, other parts of body or loss of life  Patient voiced understanding.)        Anesthesia Quick Evaluation  Anesthesia Physical Anesthesia Plan  ASA: 3  Anesthesia Plan: General   Post-op Pain Management: Ketamine IV and Ofirmev IV (intra-op)   Induction: Intravenous  PONV Risk Score and Plan: Ondansetron, Dexamethasone, Aprepitant and Midazolam  Airway Management Planned: Oral ETT  Additional Equipment: Arterial line  Intra-op Plan:   Post-operative Plan: Extubation in OR  Informed Consent: I have reviewed the patients History and Physical, chart, labs and discussed the procedure including the risks, benefits and alternatives for the proposed  anesthesia with the patient or authorized representative who has indicated his/her understanding and acceptance.     Dental advisory given  Plan Discussed with: CRNA and Surgeon  Anesthesia Plan Comments: (Discussed risks of anesthesia with patient, including PONV, sore throat, lip/dental/eye damage. Rare risks discussed as well, such as cardiorespiratory and neurological sequelae, and allergic reactions. Discussed arterial line. Discussed the role of CRNA in patient's perioperative care. Patient understands. Patient counseled on benefits of smoking cessation, and increased perioperative risks associated with continued smoking. )        Anesthesia Quick Evaluation

## 2021-02-26 NOTE — Anesthesia Procedure Notes (Signed)
Procedure Name: Intubation Date/Time: 02/26/2021 11:54 AM Performed by: Bea Graff, RN Pre-anesthesia Checklist: Patient identified, Patient being monitored, Timeout performed, Emergency Drugs available and Suction available Patient Re-evaluated:Patient Re-evaluated prior to induction Oxygen Delivery Method: Circle system utilized Preoxygenation: Pre-oxygenation with 100% oxygen Induction Type: IV induction Ventilation: Mask ventilation without difficulty Laryngoscope Size: 3 and McGraph Grade View: Grade I Tube type: Oral Tube size: 7.0 mm Number of attempts: 1 Airway Equipment and Method: Stylet Placement Confirmation: ETT inserted through vocal cords under direct vision, positive ETCO2 and breath sounds checked- equal and bilateral Secured at: 21 cm Tube secured with: Tape Dental Injury: Teeth and Oropharynx as per pre-operative assessment

## 2021-02-26 NOTE — Op Note (Signed)
PROCEDURES: 1. Robotic assisted Laparoscopic Low anterior resection 2. Robotic Laparoscopic takedown of splenic flexure 3. Perfusion check of Anastomotic pedicle using ICG   Pre-operative Diagnosis: Recurrent complicated diverticulitis  Post-operative Diagnosis: same  Surgeon: [place]   Assistants: Otho Ket [name]. ( required for the EEA anastomosis and for exposure)  Anesthesia: General endotracheal anesthesia  ASA Class: 2   Surgeon: Caroleen Hamman , MD FACS  Anesthesia: Gen. with endotracheal tube   Findings: Adhesions from the sigmoid to the pelvic wall and from small bowel to abdominal wall making the case challenging and adding significant operative time LOA took at least 60 minutes Left adnexa fused with the sigmoid making dissection very challenging Left ureter identified and preserved Tension free anastomosis with very good perfusion and negative intraoperative leak   Estimated Blood Loss: 250cc                Specimens: colon       Complications: none         Condition: stable  Procedure Details  The patient was seen again in the Holding Room. The benefits, complications, treatment options, and expected outcomes were discussed with the patient. The risks of bleeding, infection, recurrence of symptoms, failure to resolve symptoms, anastomotic leak, bowel injury, any of which could require further surgery were reviewed with the patient.   The patient was taken to Operating Room, identified  and the procedure verified.  A Time Out was held and the above information confirmed.  Prior to the induction of general anesthesia, antibiotic prophylaxis was administered. VTE prophylaxis was in place. General endotracheal anesthesia was then administered and tolerated well. After the induction, the abdomen was prepped with Chloraprep and draped in the sterile fashion. The patient was positioned in lithotomy position. 4.5 cm incision was created Left lower quadrant. The  abdominal cavity was entered under direct visualization and the Mini GelPort device was placed and pneumoperitoneum was obtained, no hemodynamic changes were apparent. . Three 8 mm robotic ports were placed under direct visualization and an additional 5 mm assist port was placed. Patient was positioned in steep trendelenburg and left side up. Robot was brought to the field and docked in the standard fashion. WE maintained visualization of our instruments at all times and avoided any collision between arms. I scrubbed out and went to the console. There were dense adhesions from sigmoid to the abdominal wall that where lysed in the standard fashion with the scissors. The adhesions were dense and thick  I decided to enlarge my incision and place gelport to use my hand to help with the enterolysis. This was done with sharp scissors and finger fracture technique. No evidence of bowel injuries  We identified the takeoff of the inferior mesenteric artery dissected the pedicle and divided using vessel sealer  in the standard fashion.   Using the sealer we were able to divide the mesorectum and and also divided the mesentery of the descending colon we mobilized the descending colon IN a medial to lateral fashion. We preserved the ureter at all times. Pelvic dissection was performed in a standard fashion, being in the areolar space anterior to the sacrum. THe mesorectum was divided with the vessel sealer. We dissected the rectum circumferentially and We divided at the mid rectum with standard 60 mm blue load  The white line of toldt was identified and divided and  We were also able to mobilize the splenic flexure using scisors in the standard fashion. The mesentery of the  descending colon was divided and we divided the mid descending colon with a blue load 10mm stapler. We used ICG green to make sure the distal descneding colon had good perfusion. We made sure we had adequate reach so the anastomosis could be  performed tension free. The specimen was removed in a bag. The robot was undocked and  I scrubbed back in. Under direct visualization the descending colon stump was opened and measured the diameter of the bowel A 29 mm dilator was perfect size. A pursestring was used after inserting the anvil device. Mr [facility]an [facility] was able to pass a 29 mm standard EEA stapler device through the anus and Under direct visualization we performed an end to end anastomosis with the EEA device. A leak test was performed inflating the colon with a [place]y syringe and a rubber catheter. No evidence of leak was observed. There was also adequate hemostasis.  All the laparoscopic ports were removed and a second look showed no evidence of any bleeding or any other injuries.  Liposomal marcaine was infiltrated at all incision sites in a full thickness fashion.  We changed gloves and  close the  abdomen with two -0 PDS sutures in a running fashion to close the anterior and posterior fascia respectively. The skin incisions were closed with 4-0 Monocryl. Dermabond was used to coat all the skin incisions. Needle and laparotomy count were correct and there were no immediate complications.  Caroleen Hamman, MD, FACS

## 2021-02-26 NOTE — Anesthesia Postprocedure Evaluation (Signed)
Anesthesia Post Note  Patient: Stacy Cox  Procedure(s) Performed: XI ROBOT ASSISTED SIGMOID COLECTOMY w/Zachary Freda Jackson assisting  Patient location during evaluation: PACU Anesthesia Type: General Level of consciousness: awake and alert Pain management: pain level controlled Vital Signs Assessment: post-procedure vital signs reviewed and stable Respiratory status: spontaneous breathing, nonlabored ventilation, respiratory function stable and patient connected to nasal cannula oxygen Cardiovascular status: blood pressure returned to baseline and stable Postop Assessment: no apparent nausea or vomiting Anesthetic complications: no   No notable events documented.   Last Vitals:  Vitals:   02/26/21 2130 02/26/21 2145  BP: (!) 144/76 (!) 142/66  Pulse: (!) 55 (!) 53  Resp: 19 12  Temp: (!) 36.4 C   SpO2: 97% 97%    Last Pain:  Vitals:   02/26/21 2145  TempSrc:   PainSc: 2                  Precious Haws [name]

## 2021-02-26 NOTE — H&P (Signed)
01/20/2021   Kinsey Karch is an 66 y.o. female.       Chief Complaint  Patient presents with   Follow-up      HPI: Charlea Nardo is a 66 y.o. with a history of DM, HTN, CHF Is here for follow up after recent hospitalization for liver abscesses secondary to diverticulitis ( she also had small diverticular abscess that did not require drain.  She did have 2 drains placed by interventional radiology during hospitalization.  SHe had a repeat Ct that I have personally reviewed showing improvement drain was removed.  She denies any fevers and she feels better.  She is tolerating p.o. and having bowel movements.She endorses intermittent pain on the left side, and suprapubic area at this is told mild to moderate intensity and intermittent.  She is back to baseline.        Past Medical History:  Diagnosis Date   CHF (congestive heart failure) (HCC)     COPD (chronic obstructive pulmonary disease) (HCC)     Diabetes mellitus, type 2 (HCC)     GERD (gastroesophageal reflux disease)     Heart disease     Hypertension     Joint pain     Polycythemia vera ([place])     Sleep apnea     Tobacco abuse             Past Surgical History:  Procedure Laterality Date   COLONOSCOPY WITH PROPOFOL N/A 01/07/2021    Procedure: COLONOSCOPY WITH PROPOFOL;  Surgeon: [name], MD;  Location: [facility] ENDOSCOPY;  Service: Endoscopy;  Laterality: N/A;   IR CATHETER TUBE CHANGE   11/25/2020   IR CATHETER TUBE CHANGE   11/25/2020   IR RADIOLOGIST EVAL & MGMT   11/19/2020   IR RADIOLOGIST EVAL & MGMT   12/02/2020   IR RADIOLOGIST EVAL & MGMT   12/09/2020           Family History  Problem Relation Age of Onset   Breast cancer Sister          sister #1 age 23   Kidney cancer Sister          sister #1 age -66's   Breast cancer Sister          sister #2 age 35's   Kidney disease Sister          sister #2 age 71's   Diabetes Mother     Heart disease Mother          + BYPASS SURGERY   Deep vein  thrombosis Mother     Arthritis Mother     Diabetes Sister     Heart disease Sister     Hypertension Brother     Heart attack Father        Social History:  reports that she has been smoking cigarettes. She has a 10.00 pack-year smoking history. She has never used smokeless tobacco. She reports that she does not drink alcohol and does not use drugs.   Allergies: No Known Allergies   Medications reviewed.       ROS Full ROS performed and is otherwise negative other than what is stated in HPI Physical Exam Vitals and nursing note reviewed. Exam conducted with a chaperone present.  Constitutional:      General: She is not in acute distress.    Appearance: Normal appearance. She is obese. She is not ill-appearing.  Eyes:     General: No scleral icterus.  Right eye: No discharge.        Left eye: No discharge.  Neck:     Vascular: No carotid bruit.  Cardiovascular:     Rate and Rhythm: Normal rate and regular rhythm.     Heart sounds: No murmur heard. Pulmonary:     Effort: Pulmonary effort is normal. No respiratory distress.     Breath sounds: Normal breath sounds. No stridor. No wheezing or rhonchi.  Abdominal:     General: Abdomen is flat. There is no distension.     Palpations: Abdomen is soft. There is no mass.     Tenderness: There is abdominal tenderness. There is no guarding or rebound.     Hernia: No hernia is present.     Comments: Mild ttp suprapubic area, no rebound or peritonitis  Musculoskeletal:        General: No swelling or tenderness. Normal range of motion.     Cervical back: Normal range of motion and neck supple. No rigidity or tenderness.  Lymphadenopathy:     Cervical: No cervical adenopathy.  Skin:    General: Skin is warm and dry.     Capillary Refill: Capillary refill takes less than 2 seconds.  Neurological:     General: No focal deficit present.     Mental Status: She is alert and oriented to person, place, and time.  Psychiatric:         Mood and Affect: Mood normal.        Behavior: Behavior normal.        Thought Content: Thought content normal.        Judgment: Judgment normal.          Assessment/Plan: Mrs. Gane is a 66 year old female with history of recurrent complicated diverticulitis most recently with both diverticular abscess and perihepatic abscess requiring drain placement.  She continues to have some intermittent lower abdominal pain likely as a result of her chronic diverticulitis.  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 In this Situation I definitely recommend sigmoid colectomy. I Do think that she will be a good candidate for robotic approach.  Procedure discussed with the patient in detail.  Risks, benefits and possible medications including but not limited to: Bleeding, infection anastomotic leak need for ostomy, potential rate intervention and prolonged hospitalization.  She understands and wishes to proceed.     Caroleen Hamman, MD [facility] General Surgeon

## 2021-02-26 NOTE — Transfer of Care (Signed)
Immediate Anesthesia Transfer of Care Note  Patient: Stacy Cox  Procedure(s) Performed: XI ROBOT ASSISTED SIGMOID COLECTOMY w/Zachary Freda Jackson assisting  Patient Location: PACU  Anesthesia Type:General  Level of Consciousness: drowsy  Airway & Oxygen Therapy: Patient Spontanous Breathing and Patient connected to face mask oxygen  Post-op Assessment: Report given to RN and Post -op Vital signs reviewed and stable  Post vital signs: Reviewed  Last Vitals:  Vitals Value Taken Time  BP 132/72 02/26/21 1915  Temp    Pulse 64 02/26/21 1919  Resp 18 02/26/21 1919  SpO2 100 % 02/26/21 1919  Vitals shown include unvalidated device data.  Last Pain:  Vitals:   02/26/21 0942  TempSrc: Temporal      Patients Stated Pain Goal: 0 (22/57/50 5183)  Complications: No notable events documented.

## 2021-02-26 NOTE — Anesthesia Procedure Notes (Signed)
Arterial Line Insertion Start/End1/26/2023 12:00 PM, 02/26/2021 12:01 PM Performed by: Arita Miss, MD  Patient location: OR. Preanesthetic checklist: patient identified, IV checked, site marked, risks and benefits discussed, surgical consent, monitors and equipment checked, pre-op evaluation, timeout performed and anesthesia consent Patient sedated Right, radial was placed Catheter size: 20 G Hand hygiene performed  and maximum sterile barriers used  Allen's test indicative of satisfactory collateral circulation Attempts: 1 Procedure performed without using ultrasound guided technique. Following insertion, dressing applied. Post procedure assessment: normal and unchanged

## 2021-02-27 ENCOUNTER — Encounter: Payer: Self-pay | Admitting: Surgery

## 2021-02-27 LAB — BASIC METABOLIC PANEL
Anion gap: 6 (ref 5–15)
BUN: 11 mg/dL (ref 8–23)
CO2: 23 mmol/L (ref 22–32)
Calcium: 8.5 mg/dL — ABNORMAL LOW (ref 8.9–10.3)
Chloride: 109 mmol/L (ref 98–111)
Creatinine, Ser: 1.08 mg/dL — ABNORMAL HIGH (ref 0.44–1.00)
GFR, Estimated: 58 mL/min — ABNORMAL LOW (ref >60)
Glucose, Bld: 145 mg/dL — ABNORMAL HIGH (ref 70–99)
Potassium: 3.9 mmol/L (ref 3.5–5.1)
Sodium: 138 mmol/L (ref 135–145)

## 2021-02-27 LAB — GLUCOSE, CAPILLARY
Glucose-Capillary: 148 mg/dL — ABNORMAL HIGH (ref 70–99)
Glucose-Capillary: 154 mg/dL — ABNORMAL HIGH (ref 70–99)
Glucose-Capillary: 105 mg/dL — ABNORMAL HIGH (ref 70–99)
Glucose-Capillary: 147 mg/dL — ABNORMAL HIGH (ref 70–99)

## 2021-02-27 LAB — CBC
HCT: 38.4 % (ref 36.0–46.0)
Hemoglobin: 12.9 g/dL (ref 12.0–15.0)
MCH: 32.5 pg (ref 26.0–34.0)
MCHC: 33.6 g/dL (ref 30.0–36.0)
MCV: 96.7 fL (ref 80.0–100.0)
Platelets: 207 10*3/uL (ref 150–400)
RBC: 3.97 MIL/uL (ref 3.87–5.11)
RDW: 13.5 % (ref 11.5–15.5)
WBC: 14 10*3/uL — ABNORMAL HIGH (ref 4.0–10.5)
nRBC: 0 % (ref 0.0–0.2)

## 2021-02-27 LAB — HEMOGLOBIN A1C
Hgb A1c MFr Bld: 6.6 % — ABNORMAL HIGH (ref 4.8–5.6)
Mean Plasma Glucose: 143 mg/dL

## 2021-02-27 MED ORDER — APIXABAN 5 MG PO TABS
5.0000 mg | ORAL_TABLET | Freq: Two times a day (BID) | ORAL | Status: DC
  Administered 2021-02-28 – 2021-03-01 (×4): 5 mg via ORAL
  Filled 2021-02-27 (×4): qty 1

## 2021-02-27 MED ORDER — AMIODARONE HCL 200 MG PO TABS
200.0000 mg | ORAL_TABLET | Freq: Every day | ORAL | Status: DC
  Administered 2021-02-27 – 2021-03-09 (×11): 200 mg via ORAL
  Filled 2021-02-27 (×11): qty 1

## 2021-02-27 MED ORDER — ORAL CARE MOUTH RINSE
15.0000 mL | Freq: Two times a day (BID) | OROMUCOSAL | Status: DC
  Administered 2021-02-27 – 2021-03-09 (×17): 15 mL via OROMUCOSAL
  Filled 2021-02-27: qty 15

## 2021-02-27 NOTE — Progress Notes (Signed)
[place] Hospital Day(s): 1.   Post op day(s): 1 Day Post-Op.   Interval History:  Patient seen and examined No acute events or new complaints overnight.  Patient reports she is sore but pain medications are helping No fever, chills, nausea, emesis She does have leukocytosis to 14.0K this morning; likely reactive from OR Slight bump in renal function; sCr - 1.08; UO - 795 ccs No electrolyte derangements  Surgical drain with 60 ccs out; serosanguinous She is on CLD  Vital signs in last 24 hours: [min-max] current  Temp:  [97.1 F (36.2 C)-98.5 F (36.9 C)] 98.5 F (36.9 C) (01/27 0422) Pulse Rate:  [53-107] 70 (01/27 0422) Resp:  [12-49] 20 (01/27 0422) BP: (111-144)/(43-91) 113/62 (01/27 0422) SpO2:  [93 %-100 %] 93 % (01/27 0422) Weight:  [107.5 kg] 107.5 kg (01/26 0942)     Height: 5\' 8"  (172.7 cm) Weight: 107.5 kg BMI (Calculated): 36.04   Intake/Output last 2 shifts:  01/26 0701 - 01/27 0700 In: 5878.5 [I.V.:5428.5; IV Piggyback:450] Out: 1093 [Urine:795; Drains:60; Blood:400]   Physical Exam:  Constitutional: alert, cooperative and no distress  Respiratory: breathing non-labored at rest; on  Cardiovascular: regular rate and sinus rhythm  Gastrointestinal: Soft, incisional soreness, non-distended, no rebound/guarding. Surgical drain in RLQ; output serosanguinous Genitourinary: Foley in place  Integumentary: Laparoscopic incisions are CDI with dermabond, no erythema or drainage   Labs:  CBC Latest Ref Rng & Units 02/27/2021 02/26/2021 02/17/2021  WBC 4.0 - 10.5 K/uL 14.0(H) 11.9(H) 8.4  Hemoglobin 12.0 - 15.0 g/dL 12.9 14.2 15.4(H)  Hematocrit 36.0 - 46.0 % 38.4 43.3 44.6  Platelets 150 - 400 K/uL 207 175 293   CMP Latest Ref Rng & Units 02/27/2021 02/26/2021 02/17/2021  Glucose 70 - 99 mg/dL 145(H) - 87  BUN 8 - 23 mg/dL 11 - 14  Creatinine 0.44 - 1.00 mg/dL 1.08(H) 1.12(H) 0.85  Sodium 135 - 145 mmol/L 138 - 140   Potassium 3.5 - 5.1 mmol/L 3.9 - 3.5  Chloride 98 - 111 mmol/L 109 - 106  CO2 22 - 32 mmol/L 23 - 26  Calcium 8.9 - 10.3 mg/dL 8.5(L) - 9.4  Total Protein 6.5 - 8.1 g/dL - - -  Total Bilirubin 0.3 - 1.2 mg/dL - - -  Alkaline Phos 38 - 126 U/L - - -  AST 15 - 41 U/L - - -  ALT 0 - 44 U/L - - -     Imaging studies: No new pertinent imaging studies   Assessment/Plan:  66 y.o. female 1 Day Post-Op s/p robotic and hand assisted laparoscopic sigmoid colectomy with EEA for history of complicated diverticulitis with abscess to liver   - Will advance to full liquid diet; Hold on further advancement until ROBF  - Complete perioperative IV ABx  - Continue IVF; can DC once diet advanced   - Discontinue foley catheter  - Continue drain; monitor and record output - Monitor abdominal examination; on-going bowel function - Monitor renal function; AKI secondary to dehydration; improving - Monitor leukocytosis; likely reactive   - Mobilization as tolerated   All of the above findings and recommendations were discussed with the patient, and the medical team, and all of patient's questions were answered to her expressed satisfaction.  -- Edison Simon, [name] [facility] 02/27/2021, 7:44 AM [phone] M-F: 7am - 4pm

## 2021-02-27 NOTE — Plan of Care (Signed)

## 2021-02-27 NOTE — Clinical Social Work Note (Signed)
°  Transition of Care [facility]) Screening Note   Patient Details  Name: Stacy Cox Date of Birth: 1955-06-23   Transition of Care [facility]) CM/SW Contact:    Eileen Stanford, LCSW Phone [place] 02/27/2021, 2:53 PM    Transition of Care Department [facility]) has reviewed patient and no TOC needs have been identified at this time. We will continue to monitor patient advancement through interdisciplinary progression rounds. If new patient transition needs arise, please place a TOC consult.

## 2021-02-28 LAB — GLUCOSE, CAPILLARY
Glucose-Capillary: 99 mg/dL (ref 70–99)
Glucose-Capillary: 145 mg/dL — ABNORMAL HIGH (ref 70–99)
Glucose-Capillary: 81 mg/dL (ref 70–99)
Glucose-Capillary: 130 mg/dL — ABNORMAL HIGH (ref 70–99)

## 2021-02-28 LAB — BASIC METABOLIC PANEL
Anion gap: 6 (ref 5–15)
BUN: 13 mg/dL (ref 8–23)
CO2: 25 mmol/L (ref 22–32)
Calcium: 8.3 mg/dL — ABNORMAL LOW (ref 8.9–10.3)
Chloride: 108 mmol/L (ref 98–111)
Creatinine, Ser: 1.06 mg/dL — ABNORMAL HIGH (ref 0.44–1.00)
GFR, Estimated: 59 mL/min — ABNORMAL LOW (ref >60)
Glucose, Bld: 100 mg/dL — ABNORMAL HIGH (ref 70–99)
Potassium: 3.4 mmol/L — ABNORMAL LOW (ref 3.5–5.1)
Sodium: 139 mmol/L (ref 135–145)

## 2021-02-28 LAB — CBC
HCT: 35.4 % — ABNORMAL LOW (ref 36.0–46.0)
Hemoglobin: 11.7 g/dL — ABNORMAL LOW (ref 12.0–15.0)
MCH: 31.9 pg (ref 26.0–34.0)
MCHC: 33.1 g/dL (ref 30.0–36.0)
MCV: 96.5 fL (ref 80.0–100.0)
Platelets: 197 10*3/uL (ref 150–400)
RBC: 3.67 MIL/uL — ABNORMAL LOW (ref 3.87–5.11)
RDW: 14.2 % (ref 11.5–15.5)
WBC: 14.8 10*3/uL — ABNORMAL HIGH (ref 4.0–10.5)
nRBC: 0 % (ref 0.0–0.2)

## 2021-02-28 MED ORDER — PANTOPRAZOLE SODIUM 40 MG PO TBEC
40.0000 mg | DELAYED_RELEASE_TABLET | Freq: Two times a day (BID) | ORAL | Status: DC
  Administered 2021-02-28 – 2021-03-09 (×18): 40 mg via ORAL
  Filled 2021-02-28 (×18): qty 1

## 2021-02-28 NOTE — Progress Notes (Signed)
[place] Hospital Day(s): 2.   Post op day(s): 2 Days Post-Op.   Interval History:  Patient seen and examined No acute events or new complaints overnight.  Patient reports she is sore but pain medications are helping No fever, chills, nausea, emesis She does have leukocytosis to 14.8K this morning; likely reactive from OR Slight bump in renal function; sCr - 1.06; UO - 801 ccs No electrolyte derangements  Surgical drain with 160 ccs out; serosanguinous She she appears to be tolerating a regular diet.  Vital signs in last 24 hours: [min-max] current  Temp:  [98.2 F (36.8 C)-98.9 F (37.2 C)] 98.2 F (36.8 C) (01/28 0742) Pulse Rate:  [63-74] 72 (01/28 0742) Resp:  [18-20] 18 (01/28 0742) BP: (96-117)/(49-70) 117/70 (01/28 0742) SpO2:  [93 %-96 %] 96 % (01/28 0742)     Height: 5\' 8"  (172.7 cm) Weight: 107.5 kg BMI (Calculated): 36.04   Intake/Output last 2 shifts:  01/27 0701 - 01/28 0700 In: 2305.1 [P.O.:780; I.V.:1525.1] Out: 962 [Urine:801; Drains:160; Stool:1]   Physical Exam:  Constitutional: alert, cooperative and no distress  Respiratory: breathing non-labored at rest; on [place] Cardiovascular: regular rate and sinus rhythm  Gastrointestinal: Soft, incisional soreness, non-distended, no rebound/guarding. Surgical drain in RLQ; output serosanguinous, drain milked and patent. Genitourinary: Foley in place  Integumentary: Laparoscopic incisions are CDI with dermabond, no erythema or drainage   Labs:  CBC Latest Ref Rng & Units 02/28/2021 02/27/2021 02/26/2021  WBC 4.0 - 10.5 K/uL 14.8(H) 14.0(H) 11.9(H)  Hemoglobin 12.0 - 15.0 g/dL 11.7(L) 12.9 14.2  Hematocrit 36.0 - 46.0 % 35.4(L) 38.4 43.3  Platelets 150 - 400 K/uL 197 207 175   CMP Latest Ref Rng & Units 02/28/2021 02/27/2021 02/26/2021  Glucose 70 - 99 mg/dL 100(H) 145(H) -  BUN 8 - 23 mg/dL 13 11 -  Creatinine 0.44 - 1.00 mg/dL 1.06(H) 1.08(H) 1.12(H)  Sodium 135 - 145  mmol/L 139 138 -  Potassium 3.5 - 5.1 mmol/L 3.4(L) 3.9 -  Chloride 98 - 111 mmol/L 108 109 -  CO2 22 - 32 mmol/L 25 23 -  Calcium 8.9 - 10.3 mg/dL 8.3(L) 8.5(L) -  Total Protein 6.5 - 8.1 g/dL - - -  Total Bilirubin 0.3 - 1.2 mg/dL - - -  Alkaline Phos 38 - 126 U/L - - -  AST 15 - 41 U/L - - -  ALT 0 - 44 U/L - - -     Imaging studies: No new pertinent imaging studies   Assessment/Plan:  66 y.o. female 2 Days Post-Op s/p robotic and hand assisted laparoscopic sigmoid colectomy with EEA for history of complicated diverticulitis with abscess to liver   - Continue drain; monitor and record output - Monitor abdominal examination; on-going bowel function - Monitor renal function; AKI secondary to dehydration; improving - Monitor leukocytosis; likely reactive   - Mobilization as tolerated  - May restart anticoagulation    All of the above findings and recommendations were discussed with the patient, and the medical team, and all of patient's questions were answered to her expressed satisfaction.  Ronny Bacon, M.D., [facility]  02/28/2021 ; 1:05 PM

## 2021-02-28 NOTE — Progress Notes (Signed)
PHARMACIST - PHYSICIAN COMMUNICATION  CONCERNING: IV to Oral Route Change Policy  RECOMMENDATION: This patient is receiving pantoprazole by the intravenous route.  Based on criteria approved by the Pharmacy and Therapeutics Committee, the intravenous medication(s) is/are being converted to the equivalent oral dose form(s).   DESCRIPTION: These criteria include: The patient is eating (either orally or via tube) and/or has been taking other orally administered medications for a least 24 hours The patient has no evidence of active gastrointestinal bleeding or impaired GI absorption (gastrectomy, short bowel, patient on TNA or NPO).  If you have questions about this conversion, please contact the [place], [facility] 02/28/2021 10:09 AM

## 2021-03-01 ENCOUNTER — Inpatient Hospital Stay: Payer: Medicare HMO

## 2021-03-01 LAB — CBC
HCT: 37.6 % (ref 36.0–46.0)
Hemoglobin: 12.6 g/dL (ref 12.0–15.0)
MCH: 32.3 pg (ref 26.0–34.0)
MCHC: 33.5 g/dL (ref 30.0–36.0)
MCV: 96.4 fL (ref 80.0–100.0)
Platelets: 229 K/uL (ref 150–400)
RBC: 3.9 MIL/uL (ref 3.87–5.11)
RDW: 14.4 % (ref 11.5–15.5)
WBC: 14.1 K/uL — ABNORMAL HIGH (ref 4.0–10.5)
nRBC: 0 % (ref 0.0–0.2)

## 2021-03-01 LAB — BASIC METABOLIC PANEL WITH GFR
Anion gap: 7 (ref 5–15)
BUN: 15 mg/dL (ref 8–23)
CO2: 24 mmol/L (ref 22–32)
Calcium: 8.4 mg/dL — ABNORMAL LOW (ref 8.9–10.3)
Chloride: 108 mmol/L (ref 98–111)
Creatinine, Ser: 1.05 mg/dL — ABNORMAL HIGH (ref 0.44–1.00)
GFR, Estimated: 60 mL/min — ABNORMAL LOW
Glucose, Bld: 150 mg/dL — ABNORMAL HIGH (ref 70–99)
Potassium: 3.6 mmol/L (ref 3.5–5.1)
Sodium: 139 mmol/L (ref 135–145)

## 2021-03-01 LAB — GLUCOSE, CAPILLARY
Glucose-Capillary: 114 mg/dL — ABNORMAL HIGH (ref 70–99)
Glucose-Capillary: 116 mg/dL — ABNORMAL HIGH (ref 70–99)
Glucose-Capillary: 108 mg/dL — ABNORMAL HIGH (ref 70–99)
Glucose-Capillary: 141 mg/dL — ABNORMAL HIGH (ref 70–99)

## 2021-03-01 MED ORDER — METFORMIN HCL 500 MG PO TABS
500.0000 mg | ORAL_TABLET | Freq: Every day | ORAL | Status: DC
  Administered 2021-03-01: 500 mg via ORAL
  Filled 2021-03-01: qty 1

## 2021-03-01 MED ORDER — SPIRONOLACTONE 25 MG PO TABS
25.0000 mg | ORAL_TABLET | Freq: Every day | ORAL | Status: DC
  Administered 2021-03-01: 25 mg via ORAL
  Filled 2021-03-01: qty 1

## 2021-03-01 MED ORDER — IOHEXOL 9 MG/ML PO SOLN
500.0000 mL | ORAL | Status: AC
  Administered 2021-03-01 (×2): 500 mL via ORAL

## 2021-03-01 MED ORDER — PIPERACILLIN-TAZOBACTAM 3.375 G IVPB
3.3750 g | Freq: Three times a day (TID) | INTRAVENOUS | Status: DC
  Administered 2021-03-01 – 2021-03-09 (×23): 3.375 g via INTRAVENOUS
  Filled 2021-03-01 (×23): qty 50

## 2021-03-01 MED ORDER — HYDROCODONE-ACETAMINOPHEN 5-325 MG PO TABS: 1.0000 | ORAL_TABLET | ORAL | Status: DC | PRN

## 2021-03-01 MED ORDER — POTASSIUM CHLORIDE CRYS ER 20 MEQ PO TBCR
20.0000 meq | EXTENDED_RELEASE_TABLET | Freq: Every day | ORAL | Status: DC
  Administered 2021-03-01 – 2021-03-02 (×2): 20 meq via ORAL
  Filled 2021-03-01 (×2): qty 1

## 2021-03-01 MED ORDER — IOHEXOL 300 MG/ML  SOLN
100.0000 mL | Freq: Once | INTRAMUSCULAR | Status: AC | PRN
  Administered 2021-03-01: 100 mL via INTRAVENOUS

## 2021-03-01 MED ORDER — FUROSEMIDE 40 MG PO TABS
40.0000 mg | ORAL_TABLET | Freq: Every day | ORAL | Status: DC
  Administered 2021-03-01: 40 mg via ORAL
  Filled 2021-03-01: qty 1

## 2021-03-01 NOTE — Progress Notes (Signed)
Patient complaining of increased pain this AM that began during the night. Starts at surgical sites and radiates to  RLQ to LLQ down her right thigh. Administered PRN oxycodone and upon reevaluation of pain level patient is sleeping. Cola colored drainage also noted from JP drain which patient states is a change from the bloody colored drainage from before. Will continue to monitor.

## 2021-03-01 NOTE — Progress Notes (Signed)
[place] Hospital Day(s): 3.   Post op day(s): 3 Days Post-Op.   Interval History:  Patient seen and examined No acute events or new complaints overnight.   However had severe episode of shortness of breath with tachycardia after voiding in the bathroom.  She denies straining, but became hypoxic and short of breath and required assistance getting back to bed. Surgical drain with 65 ccs out; In her drain bulb there was a discolored fluid present, slightly bile tinged/tea colored.  It was 1/2 bulb volume, well the fluid in the tubing appeared to be clear serous.  This concerned her greatly.  No fever, chills, nausea, emesis, tolerating diet well.  Saturations adequate on 3 L by nasal cannula. She she had leukocytosis to 14.8K yesterday; likely reactive from [place] signs in last 24 hours: [min-max] current  Temp:  [98.8 F (37.1 C)-99.8 F (37.7 C)] 99.1 F (37.3 C) (01/29 1601) Pulse Rate:  [83-129] 83 (01/29 1601) Resp:  [16-22] 20 (01/29 1601) BP: (87-147)/(57-88) 87/60 (01/29 1601) SpO2:  [90 %-100 %] 100 % (01/29 1601)     Height: 5\' 8"  (172.7 cm) Weight: 107.5 kg BMI (Calculated): 36.04   Intake/Output last 2 shifts:  01/28 0701 - 01/29 0700 In: 944.6 [I.V.:944.6] Out: 65 [Drains:65]   Physical Exam:  Constitutional: alert, cooperative and no distress  Respiratory: breathing non-labored at rest; on [place] Cardiovascular: regular rate and sinus rhythm  Gastrointestinal: Soft, incisional soreness, non-distended, no rebound/guarding. Surgical drain in RLQ; output in tube serous, drain milked and patent.  Fluid in bulb bile tinged discolored. Integumentary: Laparoscopic incisions are CDI with dermabond, trace of erythema/induration at hand-assisted site.  Labs:  CBC Latest Ref Rng & Units 02/28/2021 02/27/2021 02/26/2021  WBC 4.0 - 10.5 K/uL 14.8(H) 14.0(H) 11.9(H)  Hemoglobin 12.0 - 15.0 g/dL 11.7(L) 12.9 14.2  Hematocrit 36.0 -  46.0 % 35.4(L) 38.4 43.3  Platelets 150 - 400 K/uL 197 207 175   CMP Latest Ref Rng & Units 02/28/2021 02/27/2021 02/26/2021  Glucose 70 - 99 mg/dL 100(H) 145(H) -  BUN 8 - 23 mg/dL 13 11 -  Creatinine 0.44 - 1.00 mg/dL 1.06(H) 1.08(H) 1.12(H)  Sodium 135 - 145 mmol/L 139 138 -  Potassium 3.5 - 5.1 mmol/L 3.4(L) 3.9 -  Chloride 98 - 111 mmol/L 108 109 -  CO2 22 - 32 mmol/L 25 23 -  Calcium 8.9 - 10.3 mg/dL 8.3(L) 8.5(L) -  Total Protein 6.5 - 8.1 g/dL - - -  Total Bilirubin 0.3 - 1.2 mg/dL - - -  Alkaline Phos 38 - 126 U/L - - -  AST 15 - 41 U/L - - -  ALT 0 - 44 U/L - - -     Imaging studies: No new pertinent imaging studies   Assessment/Plan:  66 y.o. female 3 Days Post-Op s/p robotic and hand assisted laparoscopic sigmoid colectomy with EEA for history of complicated diverticulitis with abscess to liver  -Continue O2 support by nasal cannula.  - Continue drain; monitor and record output - Monitor abdominal examination; on-going bowel function - Monitor renal function; AKI secondary to dehydration; improving - Monitor leukocytosis; likely reactive   - Mobilization as tolerated    All of the above findings and recommendations were discussed with the patient, and the medical team, and all of patient's questions were answered to her expressed satisfaction.  Ronny Bacon, M.D., [facility]  03/01/2021 ; 6:04 PM

## 2021-03-01 NOTE — Progress Notes (Signed)
Pharmacy Antibiotic Note  Stacy Cox is a 66 y.o. female with history of DM, HTN, and CHF who was admitted on 02/26/2021 for follow up after recent hospitalization for liver abscesses secondary to diverticulitis.  Pharmacy has been consulted for Zosyn dosing for intra-abdominal infection.  Plan: Zosyn 3.375g IV q8h (4 hour infusion). Monitor renal function and adjust dose as clinically indicated  Height: 5\' 8"  (172.7 cm) Weight: 107.5 kg (237 lb) IBW/kg (Calculated) : 63.9  Temp (24hrs), Avg:99.3 F (37.4 C), Min:98.6 F (37 C), Max:99.8 F (37.7 C)  Recent Labs  Lab 02/26/21 2249 02/27/21 0514 02/28/21 0608  WBC 11.9* 14.0* 14.8*  CREATININE 1.12* 1.08* 1.06*    Estimated Creatinine Clearance: 69.7 mL/min (A) (by C-G formula based on SCr of 1.06 mg/dL (H)).    No Known Allergies  Antimicrobials this admission: 1/26 cefotetan >> 1/27 1/29 Zosyn >>    Thank you for allowing pharmacy to be a part of this patients care.  Forde Dandy Brooklee Michelin 03/01/2021 8:37 PM

## 2021-03-01 NOTE — Progress Notes (Signed)
Called to room by NT stating that patient became [facility] while using the toilet. Patient states she just "became weak" and " cant describe how she felt". Currently patient is on 3 liters of o2 sating at 97%. Will be using a BSC with her. Patient on room air and up with minimal assist and a walker this morning.

## 2021-03-01 NOTE — Progress Notes (Signed)
Pt c/o of increase in pain this shift. Pt states pain radiates from ABD RLQ to LLQ. Pt administered IV morphine with minimal results, administered prn Oxycodone, will evaluate effectiveness, and notify MD if no relief. Pt educated and alternative options discussed, call bell in reach, will monitor closely

## 2021-03-02 LAB — GLUCOSE, CAPILLARY
Glucose-Capillary: 113 mg/dL — ABNORMAL HIGH (ref 70–99)
Glucose-Capillary: 101 mg/dL — ABNORMAL HIGH (ref 70–99)
Glucose-Capillary: 103 mg/dL — ABNORMAL HIGH (ref 70–99)
Glucose-Capillary: 83 mg/dL (ref 70–99)
Glucose-Capillary: 117 mg/dL — ABNORMAL HIGH (ref 70–99)

## 2021-03-02 LAB — SURGICAL PATHOLOGY

## 2021-03-02 MED ORDER — LOPERAMIDE HCL 2 MG PO CAPS
2.0000 mg | ORAL_CAPSULE | Freq: Two times a day (BID) | ORAL | Status: DC
  Administered 2021-03-02 – 2021-03-09 (×14): 2 mg via ORAL
  Filled 2021-03-02 (×14): qty 1

## 2021-03-02 MED ORDER — ALBUMIN HUMAN 25 % IV SOLN
25.0000 g | Freq: Once | INTRAVENOUS | Status: AC
  Administered 2021-03-02: 25 g via INTRAVENOUS
  Filled 2021-03-02: qty 100

## 2021-03-02 MED ORDER — LOPERAMIDE HCL 2 MG PO CAPS: 2.0000 mg | ORAL_CAPSULE | ORAL | Status: DC | PRN

## 2021-03-02 MED ORDER — SODIUM CHLORIDE 0.9 % IV SOLN: INTRAVENOUS | Status: DC

## 2021-03-02 NOTE — Progress Notes (Signed)
[place] Hospital Day(s): 4.   Post op day(s): 4 Days Post-Op.   Interval History:  Patient seen and examined No acute events or new complaints overnight.  Patient reports she is still having lower abdominal soreness; worse when she feels the urge to have BM No fever, chills, nausea, emesis No new labs this morning She is making urine; unmeasured She is NPO; frustrated by not being able to eat Drain with 30 ccs out; this is seropurulent CT Abdomen/Pelvis concerning for small anastomotic leak  Vital signs in last 24 hours: [min-max] current  Temp:  [98.6 F (37 C)-99.1 F (37.3 C)] 98.8 F (37.1 C) (01/30 0311) Pulse Rate:  [83-129] 98 (01/30 0311) Resp:  [16-20] 16 (01/30 0311) BP: (87-129)/(60-65) 98/64 (01/30 0311) SpO2:  [93 %-100 %] 93 % (01/30 0311)     Height: 5\' 8"  (172.7 cm) Weight: 107.5 kg BMI (Calculated): 36.04   Intake/Output last 2 shifts:  01/29 0701 - 01/30 0700 In: 61 [IV Piggyback:65] Out: 30 [Drains:30]   Physical Exam:  Constitutional: alert, cooperative and no distress  Respiratory: breathing non-labored at rest  Cardiovascular: regular rate and sinus rhythm  Gastrointestinal: Soft, lower abdominal soreness, non-distended, no rebound/guarding. Surgical drain in RLQ with seropurulent appearing output Integumentary: Laparoscopic incisions are CDI with dermabond, no erythema or drainage   Labs:  CBC Latest Ref Rng & Units 03/01/2021 02/28/2021 02/27/2021  WBC 4.0 - 10.5 K/uL 14.1(H) 14.8(H) 14.0(H)  Hemoglobin 12.0 - 15.0 g/dL 12.6 11.7(L) 12.9  Hematocrit 36.0 - 46.0 % 37.6 35.4(L) 38.4  Platelets 150 - 400 K/uL 229 197 207   CMP Latest Ref Rng & Units 03/01/2021 02/28/2021 02/27/2021  Glucose 70 - 99 mg/dL 150(H) 100(H) 145(H)  BUN 8 - 23 mg/dL 15 13 11   Creatinine 0.44 - 1.00 mg/dL 1.05(H) 1.06(H) 1.08(H)  Sodium 135 - 145 mmol/L 139 139 138  Potassium 3.5 - 5.1 mmol/L 3.6 3.4(L) 3.9  Chloride 98 - 111  mmol/L 108 108 109  CO2 22 - 32 mmol/L 24 25 23   Calcium 8.9 - 10.3 mg/dL 8.4(L) 8.3(L) 8.5(L)  Total Protein 6.5 - 8.1 g/dL - - -  Total Bilirubin 0.3 - 1.2 mg/dL - - -  Alkaline Phos 38 - 126 U/L - - -  AST 15 - 41 U/L - - -  ALT 0 - 44 U/L - - -     Imaging studies:   CT Abdomen/Pelvis (03/01/2021) personally reviewed:  IMPRESSION: 1. Trace left pleural effusion. 2. Findings suggestive of a sigmoid resection anastomotic suture leak/dehiscence with interval development of an abscess centered in the rectouterine pouch and extending superiorly on the right to the level just below the aortic bifurcation. 3. Duplicated right collecting system with interval development of moderate right hydroureteronephrosis. The mid/distal right ureter is not visualized; however, there is likely a normal right ureterovesicular junction. Query obstruction from described gas and fluid collection noted above. 4.  Aortic Atherosclerosis (ICD10-I70.0).  Assessment/Plan:  66 y.o. female with anastomotic leak, controlled, 4 Days Post-Op s/p robotic and hand assisted laparoscopic sigmoid colectomy with EEA for history of complicated diverticulitis with abscess to liver   - She needs to be NPO; okay wit a few sips of ice chips/water for comfort; Pending clinical condition and progression, may need TPN  - Will need repeat CT Abdomen/Pelvis, likely on Wednesday 02/01. May need sooner if she deteriorates in any fashion  - Will hold off on further surgical intervention for now. Attempt  management conservatively. May need additional drain potentially. She understands further intervention surgically may result in colostomy vs ileostomy  - Continue IVF support  - Continue IV Abx (Zosyn) - Pain control prn; antiemetics prn   - Morning labs; CBC, BMP, Mag, Phos - Mobilization as tolerated  - Hold Eliquis; start Lovenox tomorrow (01/30)  All of the above findings and recommendations were discussed with the  patient, and the medical team, and all of patient's questions were answered to her expressed satisfaction.  -- Edison Simon, [name] [facility] 03/02/2021, 7:46 AM [phone] M-F: 7am - 4pm

## 2021-03-02 NOTE — Care Management Important Message (Signed)
Important Message  Patient Details  Name: Stacy Cox MRN: [id number] Date of Birth: 05/01/1955   Medicare Important Message Given:  Yes     Dannette Barbara 03/02/2021, 11:37 AM

## 2021-03-03 LAB — BASIC METABOLIC PANEL
Anion gap: 8 (ref 5–15)
BUN: 12 mg/dL (ref 8–23)
CO2: 22 mmol/L (ref 22–32)
Calcium: 8.3 mg/dL — ABNORMAL LOW (ref 8.9–10.3)
Chloride: 109 mmol/L (ref 98–111)
Creatinine, Ser: 0.8 mg/dL (ref 0.44–1.00)
GFR, Estimated: >60 mL/min (ref >60)
Glucose, Bld: 84 mg/dL (ref 70–99)
Potassium: 3.1 mmol/L — ABNORMAL LOW (ref 3.5–5.1)
Sodium: 139 mmol/L (ref 135–145)

## 2021-03-03 LAB — GLUCOSE, CAPILLARY
Glucose-Capillary: 85 mg/dL (ref 70–99)
Glucose-Capillary: 89 mg/dL (ref 70–99)
Glucose-Capillary: 73 mg/dL (ref 70–99)
Glucose-Capillary: 76 mg/dL (ref 70–99)

## 2021-03-03 LAB — CBC
HCT: 31.3 % — ABNORMAL LOW (ref 36.0–46.0)
Hemoglobin: 10.9 g/dL — ABNORMAL LOW (ref 12.0–15.0)
MCH: 32.3 pg (ref 26.0–34.0)
MCHC: 34.8 g/dL (ref 30.0–36.0)
MCV: 92.9 fL (ref 80.0–100.0)
Platelets: 230 10*3/uL (ref 150–400)
RBC: 3.37 MIL/uL — ABNORMAL LOW (ref 3.87–5.11)
RDW: 13.8 % (ref 11.5–15.5)
WBC: 13.2 10*3/uL — ABNORMAL HIGH (ref 4.0–10.5)
nRBC: 0 % (ref 0.0–0.2)

## 2021-03-03 LAB — MAGNESIUM: Magnesium: 1.7 mg/dL (ref 1.7–2.4)

## 2021-03-03 LAB — PHOSPHORUS: Phosphorus: 2.9 mg/dL (ref 2.5–4.6)

## 2021-03-03 MED ORDER — POTASSIUM CHLORIDE CRYS ER 20 MEQ PO TBCR
20.0000 meq | EXTENDED_RELEASE_TABLET | Freq: Two times a day (BID) | ORAL | Status: DC
  Administered 2021-03-03 – 2021-03-04 (×3): 20 meq via ORAL
  Filled 2021-03-03 (×3): qty 1

## 2021-03-03 MED ORDER — DEXTROSE-NACL 5-0.9 % IV SOLN: INTRAVENOUS | Status: DC

## 2021-03-03 MED ORDER — POTASSIUM CHLORIDE CRYS ER 20 MEQ PO TBCR: 20.0000 meq | EXTENDED_RELEASE_TABLET | Freq: Two times a day (BID) | ORAL | Status: DC

## 2021-03-03 MED ORDER — ENOXAPARIN SODIUM 120 MG/0.8ML IJ SOSY
1.0000 mg/kg | PREFILLED_SYRINGE | Freq: Two times a day (BID) | INTRAMUSCULAR | Status: DC
  Administered 2021-03-03 – 2021-03-06 (×5): 108 mg via SUBCUTANEOUS
  Filled 2021-03-03 (×7): qty 0.72

## 2021-03-03 NOTE — Progress Notes (Signed)
[place] Hospital Day(s): 5.   Post op day(s): 5 Days Post-Op.   Interval History:  Patient seen and examined No acute events or new complaints overnight.  Borderline febrile last night; 100F at 0430 Patient reports she continues to have intermittent crampy 5/10 abdominal pain; otherwise doing okay No fever, chills, nausea, emesis Leukocytosis is stable/improved; 13.2K Hgb 10.9; likely dilutional  Renal function normal; sCr - 0.80; UO - unmeasured Mild hypokalemia to 3.1; no other electrolyte derangements She is NPO; frustrated by not being able to eat Drain output unmeasured; serous  Vital signs in last 24 hours: [min-max] current  Temp:  [98.1 F (36.7 C)-100 F (37.8 C)] 100 F (37.8 C) (01/31 0423) Pulse Rate:  [67-84] 68 (01/31 0423) Resp:  [16-20] 20 (01/31 0423) BP: (99-118)/(59-76) 113/64 (01/31 0423) SpO2:  [91 %-95 %] 91 % (01/31 0423)     Height: 5\' 8"  (172.7 cm) Weight: 107.5 kg BMI (Calculated): 36.04   Intake/Output last 2 shifts:  No intake/output data recorded.   Physical Exam:  Constitutional: alert, cooperative and no distress  Respiratory: breathing non-labored at rest  Cardiovascular: regular rate and sinus rhythm  Gastrointestinal: Soft, lower abdominal soreness, non-distended, no rebound/guarding. Surgical drain in RLQ; output is more serous this morning Integumentary: Laparoscopic incisions are CDI with dermabond, no erythema or drainage   Labs:  CBC Latest Ref Rng & Units 03/03/2021 03/01/2021 02/28/2021  WBC 4.0 - 10.5 K/uL 13.2(H) 14.1(H) 14.8(H)  Hemoglobin 12.0 - 15.0 g/dL 10.9(L) 12.6 11.7(L)  Hematocrit 36.0 - 46.0 % 31.3(L) 37.6 35.4(L)  Platelets 150 - 400 K/uL 230 229 197   CMP Latest Ref Rng & Units 03/03/2021 03/01/2021 02/28/2021  Glucose 70 - 99 mg/dL 84 150(H) 100(H)  BUN 8 - 23 mg/dL 12 15 13   Creatinine 0.44 - 1.00 mg/dL 0.80 1.05(H) 1.06(H)  Sodium 135 - 145 mmol/L 139 139 139  Potassium  3.5 - 5.1 mmol/L 3.1(L) 3.6 3.4(L)  Chloride 98 - 111 mmol/L 109 108 108  CO2 22 - 32 mmol/L 22 24 25   Calcium 8.9 - 10.3 mg/dL 8.3(L) 8.4(L) 8.3(L)  Total Protein 6.5 - 8.1 g/dL - - -  Total Bilirubin 0.3 - 1.2 mg/dL - - -  Alkaline Phos 38 - 126 U/L - - -  AST 15 - 41 U/L - - -  ALT 0 - 44 U/L - - -     Imaging studies: No pertinent imaging studies  Assessment/Plan:  66 y.o. female with anastomotic leak, controlled, 5 Days Post-Op s/p robotic and hand assisted laparoscopic sigmoid colectomy with EEA for history of complicated diverticulitis with abscess to liver.   - She needs to be NPO; okay with a few sips of ice chips/water for comfort; Pending clinical condition and progression, may need TPN later this week  - Will repeat CT Abdomen/Pelvis tomorrow morning, Wednesday 02/01. May need sooner if she deteriorates in any fashion.  - Will hold off on further surgical intervention for now. Attempt management conservatively. May need additional drain potentially. She understands further intervention surgically may result in colostomy vs ileostomy. Will follow closely  - Replete K+; monitor   - Continue IVF support  - Continue IV Abx (Zosyn) - Pain control prn; antiemetics prn   - Morning labs; CBC, BMP - Mobilization as tolerated  - Hold Eliquis; start Lovenox today (01/31)  All of the above findings and recommendations were discussed with the patient, and the medical team, and all of patient's  questions were answered to her expressed satisfaction.  -- Edison Simon, [name] [facility] 03/03/2021, 7:46 AM [phone] M-F: 7am - 4pm

## 2021-03-03 NOTE — Progress Notes (Signed)
Mobility Specialist - Progress Note   03/03/21 1100  Mobility  Activity Ambulated with assistance in hallway;Ambulated with assistance to bathroom;Ambulated with assistance in room  Level of Assistance Standby assist, set-up cues, supervision of patient - no hands on  Assistive Device None;Other (Comment) (IV Pole)  Distance Ambulated (ft) 175 ft  Activity Response Tolerated well  $Mobility charge 1 Mobility    During mobility: 102 HR, 86% SpO2 Post-mobility: 93 HR, 90% SpO2   Pt lying in bed upon arrival, utilizing RA. Pt voiced 5/10 pain that did slightly increase with OOB activity. Supervision and extra time to exit bed, no dizziness. Pt ambulated to bathroom prior to continuation in hallway. Fatigued with ambulation, reports "toes burning" during last 30', SOB. O2 desat to 86%. PLB educated. Pt returned supine with needs in reach.    Kathee Delton Mobility Specialist 03/03/21, 11:29 AM

## 2021-03-04 ENCOUNTER — Inpatient Hospital Stay: Payer: Medicare HMO

## 2021-03-04 ENCOUNTER — Encounter: Payer: Self-pay | Admitting: Surgery

## 2021-03-04 LAB — BASIC METABOLIC PANEL
Anion gap: 7 (ref 5–15)
BUN: 11 mg/dL (ref 8–23)
CO2: 22 mmol/L (ref 22–32)
Calcium: 8.1 mg/dL — ABNORMAL LOW (ref 8.9–10.3)
Chloride: 112 mmol/L — ABNORMAL HIGH (ref 98–111)
Creatinine, Ser: 0.85 mg/dL (ref 0.44–1.00)
GFR, Estimated: >60 mL/min (ref >60)
Glucose, Bld: 85 mg/dL (ref 70–99)
Potassium: 3.3 mmol/L — ABNORMAL LOW (ref 3.5–5.1)
Sodium: 141 mmol/L (ref 135–145)

## 2021-03-04 LAB — CBC
HCT: 31.4 % — ABNORMAL LOW (ref 36.0–46.0)
Hemoglobin: 11.2 g/dL — ABNORMAL LOW (ref 12.0–15.0)
MCH: 31.9 pg (ref 26.0–34.0)
MCHC: 35.7 g/dL (ref 30.0–36.0)
MCV: 89.5 fL (ref 80.0–100.0)
Platelets: 253 10*3/uL (ref 150–400)
RBC: 3.51 MIL/uL — ABNORMAL LOW (ref 3.87–5.11)
RDW: 13.8 % (ref 11.5–15.5)
WBC: 12.1 10*3/uL — ABNORMAL HIGH (ref 4.0–10.5)
nRBC: 0 % (ref 0.0–0.2)

## 2021-03-04 LAB — GLUCOSE, CAPILLARY
Glucose-Capillary: 91 mg/dL (ref 70–99)
Glucose-Capillary: 75 mg/dL (ref 70–99)
Glucose-Capillary: 81 mg/dL (ref 70–99)
Glucose-Capillary: 74 mg/dL (ref 70–99)
Glucose-Capillary: 81 mg/dL (ref 70–99)

## 2021-03-04 MED ORDER — IOHEXOL 9 MG/ML PO SOLN
500.0000 mL | ORAL | Status: AC
  Administered 2021-03-04 (×2): 500 mL via ORAL

## 2021-03-04 MED ORDER — FUROSEMIDE 20 MG PO TABS
20.0000 mg | ORAL_TABLET | Freq: Two times a day (BID) | ORAL | Status: DC
  Administered 2021-03-04 – 2021-03-09 (×10): 20 mg via ORAL
  Filled 2021-03-04 (×10): qty 1

## 2021-03-04 MED ORDER — POTASSIUM CHLORIDE CRYS ER 20 MEQ PO TBCR
20.0000 meq | EXTENDED_RELEASE_TABLET | Freq: Three times a day (TID) | ORAL | Status: DC
  Administered 2021-03-04 (×2): 20 meq via ORAL
  Filled 2021-03-04 (×2): qty 1

## 2021-03-04 MED ORDER — MIDAZOLAM HCL 2 MG/2ML IJ SOLN
INTRAMUSCULAR | Status: AC
  Filled 2021-03-04: qty 2

## 2021-03-04 MED ORDER — FENTANYL CITRATE (PF) 100 MCG/2ML IJ SOLN
INTRAMUSCULAR | Status: AC | PRN
  Administered 2021-03-04: 50 ug via INTRAVENOUS
  Administered 2021-03-04: 25 ug via INTRAVENOUS

## 2021-03-04 MED ORDER — MIDAZOLAM HCL 2 MG/2ML IJ SOLN
INTRAMUSCULAR | Status: AC | PRN
  Administered 2021-03-04: .5 mg via INTRAVENOUS
  Administered 2021-03-04: 1 mg via INTRAVENOUS

## 2021-03-04 MED ORDER — FENTANYL CITRATE (PF) 100 MCG/2ML IJ SOLN
INTRAMUSCULAR | Status: AC
  Filled 2021-03-04: qty 2

## 2021-03-04 MED ORDER — SODIUM CHLORIDE 0.9% FLUSH
5.0000 mL | Freq: Three times a day (TID) | INTRAVENOUS | Status: DC
  Administered 2021-03-04 – 2021-03-09 (×13): 5 mL

## 2021-03-04 MED ORDER — IOHEXOL 300 MG/ML  SOLN
100.0000 mL | Freq: Once | INTRAMUSCULAR | Status: AC | PRN
  Administered 2021-03-04: 100 mL via INTRAVENOUS

## 2021-03-04 MED ORDER — SPIRONOLACTONE 25 MG PO TABS
25.0000 mg | ORAL_TABLET | Freq: Every day | ORAL | Status: DC
  Administered 2021-03-04 – 2021-03-09 (×6): 25 mg via ORAL
  Filled 2021-03-04 (×6): qty 1

## 2021-03-04 NOTE — TOC Initial Note (Signed)
Transition of Care [facility]) - Initial/Assessment Note    Patient Details  Name: Ivanna Kocak MRN: [id number] Date of Birth: 09/07/1955  Transition of Care [facility]) CM/SW Contact:    Candie Chroman, LCSW Phone Number: 03/04/2021, 11:40 AM  Clinical Narrative:   Readmission prevention screen complete. CSW met with patient. Daughter at bedside. CSW introduced role and explained that discharge planning would be discussed. PCP is Tomasa Hose, MD at [facility]. She drives herself to appointments. She uses the [employer] or [employer] which mails her medications. No issues obtaining medications. No home health or DME use prior to admission. One of her daughters will drive her home at discharge. No further concerns. CSW encouraged patient to contact CSW as needed. CSW will continue to follow patient for support and facilitate return home when stable.               Expected Discharge Plan: Home/Self Care Barriers to Discharge: Continued Medical Work up   Patient Goals and CMS Choice        Expected Discharge Plan and Services Expected Discharge Plan: Home/Self Care     Post Acute Care Choice: NA Living arrangements for the past 2 months: Single Family Home                                      Prior Living Arrangements/Services Living arrangements for the past 2 months: Single Family Home   Patient language and need for interpreter reviewed:: Yes Do you feel safe going back to the place where you live?: Yes      Need for Family Participation in Patient Care: Yes (Comment) Care giver support system in place?: Yes (comment)   Criminal Activity/Legal Involvement Pertinent to Current Situation/Hospitalization: No - Comment as needed  Activities of Daily Living Home Assistive Devices/Equipment: Eyeglasses ADL Screening (condition at time of admission) Patient's cognitive ability adequate to safely complete daily activities?: Yes Is the patient deaf or  have difficulty hearing?: No Does the patient have difficulty seeing, even when wearing glasses/contacts?: No Does the patient have difficulty concentrating, remembering, or making decisions?: No Patient able to express need for assistance with ADLs?: Yes Does the patient have difficulty dressing or bathing?: No Independently performs ADLs?: Yes (appropriate for developmental age) Does the patient have difficulty walking or climbing stairs?: Yes Weakness of Legs: Both Weakness of Arms/Hands: None  Permission Sought/Granted Permission sought to share information with : Facility Sport and exercise psychologist, Family Supports Permission granted to share information with : Yes, Verbal Permission Granted        Permission granted to share info w Relationship: Daughter at bedside     Emotional Assessment Appearance:: Appears stated age Attitude/Demeanor/Rapport: Engaged, Gracious Affect (typically observed): Accepting, Appropriate, Calm, Pleasant Orientation: : Oriented to Self, Oriented to Place, Oriented to  Time, Oriented to Situation Alcohol / Substance Use: Not Applicable Psych Involvement: No (comment)  Admission diagnosis:  S/P laparoscopic colectomy [Z90.49] Patient Active Problem List   Diagnosis Date Noted   S/P laparoscopic colectomy 02/26/2021   Rectal polyp    Erythema of colon    Polyp of ascending colon    Chronic systolic CHF (congestive heart failure) ([place]) 11/06/2020   Obesity, Class III, BMI 40-49.9 (morbid obesity) ([place]) 11/06/2020   Drop in hemoglobin    AF (paroxysmal atrial fibrillation) (H[facility])    Essential hypertension    Chronic diastolic CHF (congestive heart failure) (  [place])    Lactic acidosis    Hypokalemia    Acute respiratory failure (HCC)    Liver abscess 11/02/2020   COPD (chronic obstructive pulmonary disease) (HCC)    CHF (congestive heart failure) ([place])    Acute diverticulitis 05/02/2015   Sepsis secondary to acute diverticulitis 05/02/2015   Tobacco  abuse 05/02/2015   Type 2 diabetes mellitus  05/02/2015   Polycythemia vera ([place]) 12/13/2014   PCP:  Donnie Coffin, MD Pharmacy:   [facility] [address] [place] [address] Phone: [phone] Fax: [phone]  [facility] - [place], [place] [address] [place] [address] Phone: [phone] Fax: [phone]     Social Determinants of Health (SDOH) Interventions    Readmission Risk Interventions Readmission Risk Prevention Plan 03/04/2021  Transportation Screening Complete  PCP or Specialist Appt within 5-7 Days Complete  Medication Review (RN CM) Complete  Some recent data might be hidden

## 2021-03-04 NOTE — Procedures (Signed)
Interventional Radiology Procedure Note  Date of Procedure: 03/04/2021  Procedure: Pelvic drain placement   Findings:  1. Successful Ct guided placement of 12 Fr locking drainage catheter into pelvic collection via left transgluteal approach  2. Drain placed to bulb suction  3. Sample of think stool output sent to lab for analysis   Complications: No immediate complications noted.   Estimated Blood Loss: minimal  Follow-up and Recommendations: 1. Keep to bulb suction  2. Irrigate PRN to prevent clogging    Albin Felling, MD  Vascular & Interventional Radiology  03/04/2021 4:31 PM

## 2021-03-04 NOTE — Progress Notes (Signed)
[place] Hospital Day(s): 6.   Post op day(s): 6 Days Post-Op.   Interval History:  Patient seen and examined No acute events or new complaints overnight.  She is doing okay; intermittent abdominal soreness; frustrated this morning No fever, chills, nausea, emesis Leukocytosis is stable/improved; 12.1K Hgb 11.2; stable Renal function normal; sCr - 0.85; UO - unmeasured Mild hypokalemia improved to 3.3; no other electrolyte derangements She is NPO; frustrated by not being able to eat Drain output is 10; serous  Vital signs in last 24 hours: [min-max] current  Temp:  [98.5 F (36.9 C)-99 F (37.2 C)] 98.5 F (36.9 C) (02/01 0731) Pulse Rate:  [71-88] 82 (02/01 0731) Resp:  [16-20] 16 (02/01 0731) BP: (119-152)/(62-89) 152/76 (02/01 0731) SpO2:  [85 %-97 %] 93 % (02/01 0731)     Height: 5\' 8"  (172.7 cm) Weight: 107.5 kg BMI (Calculated): 36.04   Intake/Output last 2 shifts:  01/31 0701 - 02/01 0700 In: 998.5 [I.V.:800; IV Piggyback:198.5] Out: 10 [Drains:10]   Physical Exam:  Constitutional: alert, cooperative and no distress  Respiratory: breathing non-labored at rest  Cardiovascular: regular rate and sinus rhythm  Gastrointestinal: Soft, lower abdominal soreness, non-distended, no rebound/guarding. Surgical drain in RLQ; output is more serous this morning Integumentary: Laparoscopic incisions are CDI with dermabond, no erythema or drainage   Labs:  CBC Latest Ref Rng & Units 03/04/2021 03/03/2021 03/01/2021  WBC 4.0 - 10.5 K/uL 12.1(H) 13.2(H) 14.1(H)  Hemoglobin 12.0 - 15.0 g/dL 11.2(L) 10.9(L) 12.6  Hematocrit 36.0 - 46.0 % 31.4(L) 31.3(L) 37.6  Platelets 150 - 400 K/uL 253 230 229   CMP Latest Ref Rng & Units 03/04/2021 03/03/2021 03/01/2021  Glucose 70 - 99 mg/dL 85 84 150(H)  BUN 8 - 23 mg/dL 11 12 15   Creatinine 0.44 - 1.00 mg/dL 0.85 0.80 1.05(H)  Sodium 135 - 145 mmol/L 141 139 139  Potassium 3.5 - 5.1 mmol/L 3.3(L)  3.1(L) 3.6  Chloride 98 - 111 mmol/L 112(H) 109 108  CO2 22 - 32 mmol/L 22 22 24   Calcium 8.9 - 10.3 mg/dL 8.1(L) 8.3(L) 8.4(L)  Total Protein 6.5 - 8.1 g/dL - - -  Total Bilirubin 0.3 - 1.2 mg/dL - - -  Alkaline Phos 38 - 126 U/L - - -  AST 15 - 41 U/L - - -  ALT 0 - 44 U/L - - -     Imaging studies:   CT Abdomen/Pelvis (03/04/2021) personally reviewed which shows continued evidence of anastomotic leak with PO contrast extravasation, stable to slightly increased from 01/29, no free air, contrast reaches beyond this, and radiologist report reviewed below: IMPRESSION: 1. Status post low anterior resection and reanastomosis. There is an unchanged air and fluid collection about the anastomotic suture line in the low pelvis posterior to the uterus and anterior to the rectum with clear evidence of extraluminal contrast leak, this collection measuring approximately 10.3 x 4.8 cm in axial extent. 2. Small volume free fluid in the low abdomen and pelvis. 3. 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 These are presumably obstructed by abscess cavity in the right hemipelvis. 4. Small left pleural effusion, slightly increased compared to prior examination. 5. Cardiomegaly and coronary artery disease.   Assessment/Plan:  66 y.o. female with anastomotic leak, contained, 6 Days Post-Op s/p robotic and hand assisted laparoscopic sigmoid colectomy with EEA for history of complicated diverticulitis with abscess to liver.   -  I reviewed patient's case and images with IR this morning. They feel there is potential window for left transgluteal drain placement. Will plan to proceed today. Greatly appreciate their willingness to help   - NPO for now; she should be okay for CLD following drain placement   - Will hold off on further surgical intervention for now. Attempt management conservatively. May  need additional drain potentially. She understands further intervention surgically may result in colostomy vs ileostomy. Will follow closely  - Replete K+; monitor; improving   - Continue IVF support  - Continue IV Abx (Zosyn) - Pain control prn; antiemetics prn   - Morning labs; CBC, BMP - Mobilization as tolerated  - Hold Lovenox today for procedure  All of the above findings and recommendations were discussed with the patient, and the medical team, and all of patient's questions were answered to her expressed satisfaction.  -- Edison Simon, [name] [facility] 03/04/2021, 7:48 AM [phone] M-F: 7am - 4pm

## 2021-03-04 NOTE — Consult Note (Signed)
Chief Complaint: Patient was seen in consultation today for intra-abdominal fluid collection at the request of Dr. Dahlia Byes  Referring Physician(s): Dr. Dahlia Byes  Supervising Physician: Juliet Rude  Patient Status: [facility] - In-pt  History of Present Illness: Stacy Cox is a 66 y.o. female with recurrent complicated diverticulitis s/p robotic assisted laparoscopic sigmoid colectomy 1/26 with continued abdominal pain and CT done today with findings of fluid collection around anastomotic suture line in pelvis with evidence of extraluminal contrast leak. Surgery request IR evaluation and drain placement. The patient currently admits to ongoing RLQ abdominal pain, she denies any current chest pain or shortness of breath. She has no known complications to moderate sedation. She states she would like to proceed with drain placement today.   Past Medical History:  Diagnosis Date   CHF (congestive heart failure) (HCC)    COPD (chronic obstructive pulmonary disease) (HCC)    Diabetes mellitus, type 2 (HCC)    GERD (gastroesophageal reflux disease)    Heart disease    Hypertension    Joint pain    Polycythemia vera ([place])    Sleep apnea    Tobacco abuse     Past Surgical History:  Procedure Laterality Date   COLONOSCOPY  2017   COLONOSCOPY WITH PROPOFOL N/A 01/07/2021   Procedure: COLONOSCOPY WITH PROPOFOL;  Surgeon: [name], MD;  Location: [facility] ENDOSCOPY;  Service: Endoscopy;  Laterality: N/A;   IR CATHETER TUBE CHANGE  11/25/2020   IR CATHETER TUBE CHANGE  11/25/2020   IR RADIOLOGIST EVAL & MGMT  11/19/2020   IR RADIOLOGIST EVAL & MGMT  12/02/2020   IR RADIOLOGIST EVAL & MGMT  12/09/2020    Allergies: Patient has no known allergies.  Medications: Prior to Admission medications   Medication Sig Start Date End Date Taking? Authorizing Provider  amiodarone (PACERONE) 200 MG tablet Take 1 tablet (200 mg total) by mouth daily. 11/08/20  Yes [name], MD  apixaban  (ELIQUIS) 5 MG TABS tablet Take 1 tablet (5 mg total) by mouth 2 (two) times daily. 11/07/20  Yes [name], MD  Ascorbic Acid (VITAMIN C) 1000 MG tablet Take 1,000 mg by mouth daily.   Yes [provider]  bisacodyl (DULCOLAX) 5 MG EC tablet Take all 4 tablets at 8 am the morning prior to your surgery. 01/19/21  Yes [name], [name], MD  carvedilol (COREG CR) 20 MG 24 hr capsule Take 20 mg by mouth daily.   Yes [provider]  Cholecalciferol (VITAMIN D) 50 MCG (2000 UT) CAPS Take 2,000 Units by mouth daily.   Yes [provider]  diclofenac Sodium (VOLTAREN) 1 % GEL Apply 2 g topically daily as needed (joint pain). 12/29/20  Yes [provider]  furosemide (LASIX) 40 MG tablet Take 40 mg by mouth daily. 11/19/14  Yes [provider]  gabapentin (NEURONTIN) 100 MG capsule Take 100-200 mg by mouth 3 (three) times daily as needed (pain). 10/20/20  Yes [provider]  metFORMIN (GLUCOPHAGE) 500 MG tablet Take 500 mg by mouth daily with breakfast. 11/19/14  Yes [provider]  metroNIDAZOLE (FLAGYL) 500 MG tablet Take 2 tablets at 8AM, take 2 tablets at [facility], and take 2 tablets at 8PM the day prior to your surgery 01/19/21  Yes [name], [name], MD  Multiple Vitamins-Minerals (MULTIVITAL PO) Take 1 tablet by mouth daily.   Yes [provider]  neomycin (MYCIFRADIN) 500 MG tablet Take 2 tablet at 8am, take 2 tablets at 2pm,  and take 2 tablets at 8pm the day prior to your surgery 01/19/21  Yes [name], [name], MD  nicotine (NICODERM CQ) 21 mg/24hr patch Place 1 patch (21 mg total) onto the skin daily. 01/19/21  Yes [name], [name], MD  pantoprazole (PROTONIX) 40 MG tablet Take 40 mg by mouth daily. 11/19/14  Yes [provider]  polyethylene glycol powder (MIRALAX) 17 GM/SCOOP powder Mix full container in 64 ounces of Gatorade or other clear liquid. NO Red liquid 01/19/21  Yes [name], [name], MD  potassium chloride SA  (K-DUR,KLOR-CON) 20 MEQ tablet Take 20 mEq by mouth daily. 11/20/14  Yes [provider]  spironolactone (ALDACTONE) 25 MG tablet Take 1 tablet by mouth daily. 11/19/14  Yes [provider]  vitamin B-12 (CYANOCOBALAMIN) 1000 MCG tablet Take 1,000 mcg by mouth daily.   Yes [provider]  zinc gluconate 50 MG tablet Take 50 mg by mouth daily.   Yes [provider]     Family History  Problem Relation Age of Onset   Breast cancer Sister        sister #1 age 5   Kidney cancer Sister        sister #1 age -57's   Breast cancer Sister        sister #2 age 56's   Kidney disease Sister        sister #2 age 69's   Diabetes Mother    Heart disease Mother        + BYPASS SURGERY   Deep vein thrombosis Mother    Arthritis Mother    Diabetes Sister    Heart disease Sister    Hypertension Brother    Heart attack Father     Social History   Socioeconomic History   Marital status: Divorced    Spouse name: Not on file   Number of children: 4   Years of education: Not on file   Highest education level: Not on file  Occupational History   Not on file  Tobacco Use   Smoking status: Every Day    Packs/day: 1.00    Years: 40.00    Pack years: 40.00    Types: Cigarettes   Smokeless tobacco: Former    Types: Snuff   Tobacco comments:    smoking since teens  Vaping Use   Vaping Use: Former  Substance and Sexual Activity   Alcohol use: No    Alcohol/week: 0.0 standard drinks   Drug use: No   Sexual activity: Not Currently  Other Topics Concern   Not on file  Social History Narrative   Lives with daughter   Social Determinants of Health   Financial Resource Strain: Not on file  Food Insecurity: Not on file  Transportation Needs: Not on file  Physical Activity: Not on file  Stress: Not on file  Social Connections: Not on file   Review of Systems: A 12 point ROS discussed and pertinent positives are indicated in the HPI above.  All other  systems are negative.  Review of Systems  Vital Signs: BP (!) 152/76 (BP Location: Left Arm)    Pulse 82    Temp 98.5 F (36.9 C)    Resp 16    Ht 5\' 8"  (1.727 m)    Wt 237 lb (107.5 kg)    SpO2 93%    BMI 36.04 kg/m   Physical Exam Constitutional:      Appearance: Normal appearance.  HENT:  Head: Normocephalic and atraumatic.  Cardiovascular:     Rate and Rhythm: Normal rate and regular rhythm.  Pulmonary:     Effort: Pulmonary effort is normal. No respiratory distress.  Abdominal:     General: There is distension.     Tenderness: There is abdominal tenderness.     Comments: RLQ surgical drain intact  Neurological:     Mental Status: She is alert.    Imaging: CT ABDOMEN PELVIS W CONTRAST  Result Date: 03/04/2021 CLINICAL DATA:  Postoperative abdominal pain, status post laparoscopic low anterior resection EXAM: CT ABDOMEN AND PELVIS WITH CONTRAST TECHNIQUE: Multidetector CT imaging of the abdomen and pelvis was performed using the standard protocol following bolus administration of intravenous contrast. RADIATION DOSE REDUCTION: This exam was performed according to the departmental dose-optimization program which includes automated exposure control, adjustment of the mA and/or kV according to patient size and/or use of iterative reconstruction technique. CONTRAST:  169mL OMNIPAQUE IOHEXOL 300 MG/ML SOLN, additional oral enteric contrast COMPARISON:  03/01/2021 FINDINGS: Lower chest: Small left pleural effusion, slightly increased compared to prior examination. Cardiomegaly. Three-vessel coronary artery calcifications Hepatobiliary: No solid liver abnormality is seen. No gallstones, gallbladder wall thickening, or biliary dilatation. Pancreas: Unremarkable. No pancreatic ductal dilatation or surrounding inflammatory changes. Spleen: Normal in size without significant abnormality. Adrenals/Urinary Tract: Adrenal glands are unremarkable. 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 These are presumably obstructed by abscess cavity in the right hemipelvis. Bladder is unremarkable. Stomach/Bowel: Stomach is within normal limits. Appendix appears normal. Status post low anterior resection and reanastomosis. There is an unchanged air and fluid collection about the anastomotic suture line in the low pelvis posterior to the uterus and anterior to the rectum with clear evidence of extraluminal contrast leak, this collection measuring approximately 10.3 x 4.8 cm (series 2, image 76). Vascular/Lymphatic: Aortic atherosclerosis. No enlarged abdominal or pelvic lymph nodes. Reproductive: No mass or other significant abnormality. Other: Anasarca. Unchanged subcutaneous emphysema about the right hemiabdomen (series 2, image 36). Small volume free fluid in the low abdomen and pelvis. Right ventral approach surgical drain is positioned about the low abdomen, tip in the left lower quadrant. Musculoskeletal: No acute or significant osseous findings. IMPRESSION: 1. Status post low anterior resection and reanastomosis. There is an unchanged air and fluid collection about the anastomotic suture line in the low pelvis posterior to the uterus and anterior to the rectum with clear evidence of extraluminal contrast leak, this collection measuring approximately 10.3 x 4.8 cm in axial extent. 2. Small volume free fluid in the low abdomen and pelvis. 3. 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 These are presumably obstructed by abscess cavity in the right hemipelvis. 4. Small left pleural effusion, slightly increased compared to prior examination. 5. Cardiomegaly and coronary artery disease. Aortic Atherosclerosis (ICD10-I70.0). Electronically Signed   By: Delanna Ahmadi M.D.   On: 03/04/2021 08:26   CT ABDOMEN PELVIS  W CONTRAST  Result Date: 03/01/2021 CLINICAL DATA:  Abdominal pain, acute, nonlocalized. XI ROBOT ASSISTED SIGMOID COLECTOMY 2 days ago, continued abdominal pain EXAM: CT ABDOMEN AND PELVIS WITH CONTRAST TECHNIQUE: Multidetector CT imaging of the abdomen and pelvis was performed using the standard protocol following bolus administration of intravenous contrast. RADIATION DOSE REDUCTION: This exam was performed according to the departmental dose-optimization program which includes automated exposure control, adjustment of the mA and/or kV according to patient  size and/or use of iterative reconstruction technique. CONTRAST:  168mL OMNIPAQUE IOHEXOL 300 MG/ML  SOLN COMPARISON:  CT abdomen pelvis 11/19/2020, CT abdomen pelvis 12/09/2020 FINDINGS: Lower chest: Trace left pleural effusion. Lingular and left lower lobe atelectasis. Coronary artery calcifications. Hepatobiliary: No focal liver abnormality. No gallstones, gallbladder wall thickening, or pericholecystic fluid. No biliary dilatation. Pancreas: No focal lesion. Normal pancreatic contour. No surrounding inflammatory changes. No main pancreatic ductal dilatation. Spleen: Normal in size without focal abnormality. Adrenals/Urinary Tract: No adrenal nodule bilaterally. Bilateral kidneys enhance symmetrically. Duplicated right collecting system. Interval development of moderate right hydroureteronephrosis. The mid/distal right ureter is not visualized; however, there is likely a normal right ureterovesicular junction (2:86). No left hydroureteronephrosis. No nephroureterolithiasis bilaterally. The urinary bladder is unremarkable. Stomach/Bowel: PO contrast reaches the cecum. Status post sigmoid resection. Colonic anastomotic sutures are noted to be just adjacent to a gas and fluid collection with suggestion of a linear path of gas extending from the lumen of the rectum into the peritoneum (6:75, 2:72). Stomach is within normal limits. No evidence of bowel wall  thickening or dilatation. Appendix appears normal. Vascular/Lymphatic: No abdominal aorta or iliac aneurysm. Severe atherosclerotic plaque of the aorta and its branches. Prominent but nonenlarged retroperitoneal lymph nodes. No abdominal, pelvic, or inguinal lymphadenopathy. Reproductive: Uterus and bilateral adnexa are unremarkable. Other: There is a right inferior anterolateral surgical drain approach with tip terminating within the left anterior pelvis. Trace to small volume free fluid and gas some of which is likely postsurgical in etiology. Interval development of a irregular gas and fluid collection centered in the rectouterine pouch and extending superiorly the level just below the aortic bifurcation. Associated surrounding mild peripheral enhancement. Musculoskeletal: Subcutaneus soft tissue edema and emphysema along the right anterior abdominal wall likely postsurgical. No suspicious lytic or blastic osseous lesions. No acute displaced fracture. Multilevel degenerative changes of the spine. IMPRESSION: 1. Trace left pleural effusion. 2. Findings suggestive of a sigmoid resection anastomotic suture leak/dehiscence with interval development of an abscess centered in the rectouterine pouch and extending superiorly on the right to the level just below the aortic bifurcation. 3. Duplicated right collecting system with interval development of moderate right hydroureteronephrosis. The mid/distal right ureter is not visualized; however, there is likely a normal right ureterovesicular junction. Query obstruction from described gas and fluid collection noted above. 4.  Aortic Atherosclerosis (ICD10-I70.0). These results will be called to the ordering clinician or representative by the Radiologist Assistant, and communication documented in the PACS or [employer]. Electronically Signed   By: Iven Finn M.D.   On: 03/01/2021 23:56    Labs:  CBC: Recent Labs    02/28/21 0608 03/01/21 2048  03/03/21 0533 03/04/21 0455  WBC 14.8* 14.1* 13.2* 12.1*  HGB 11.7* 12.6 10.9* 11.2*  HCT 35.4* 37.6 31.3* 31.4*  PLT 197 229 230 253    COAGS: Recent Labs    11/03/20 0619 11/25/20 0940  INR 1.3* 1.1    BMP: Recent Labs    02/28/21 0608 03/01/21 2048 03/03/21 0533 03/04/21 0455  NA 139 139 139 141  K 3.4* 3.6 3.1* 3.3*  CL 108 108 109 112*  CO2 25 24 22 22   GLUCOSE 100* 150* 84 85  BUN 13 15 12 11   CALCIUM 8.3* 8.4* 8.3* 8.1*  CREATININE 1.06* 1.05* 0.80 0.85  GFRNONAA 59* 60* >60 >60    LIVER FUNCTION TESTS: Recent Labs    11/02/20 1219 11/03/20 0619 11/04/20 0614  BILITOT 2.9* 3.2* 3.3*  AST 64*  59* 66*  ALT 77* 65* 61*  ALKPHOS 135* 131* 106  PROT 8.0 7.1 5.3*  ALBUMIN 3.1* 2.6* 2.1*    Assessment and Plan: 66 year old female with recurrent complicated diverticulitis s/p robotic assisted laparoscopic sigmoid colectomy 1/26 with continued abdominal pain and CT done today with findings of fluid collection around anastomotic suture line in pelvis with evidence of extraluminal contrast leak. Surgery request IR evaluation and drain placement. Patient with leukocytosis trending down on IV Zosyn.  The patient has been NPO, no blood thinners taken, imaging and case was discussed with attending, Dr. Denna Haggard and amendable to percutaneous drain, labs and vitals have been reviewed.  Risks and benefits discussed with the patient including bleeding, infection, damage to adjacent structures, fistula connection, and need for repeat imaging.  All of the patient's questions were answered, patient is agreeable to proceed. Consent signed and in chart.  Thank you for this interesting consult.  I greatly enjoyed meeting Stacy Cox and look forward to participating in their care.  A copy of this report was sent to the requesting provider on this date.  Electronically Signed: Hedy Jacob, [name] 03/04/2021, 2:43 PM   I spent a total of 20 Minutes in face to face in  clinical consultation, greater than 50% of which was counseling/coordinating care for intra-abdominal fluid collection.

## 2021-03-05 LAB — BASIC METABOLIC PANEL
Anion gap: 7 (ref 5–15)
BUN: 7 mg/dL — ABNORMAL LOW (ref 8–23)
CO2: 21 mmol/L — ABNORMAL LOW (ref 22–32)
Calcium: 8 mg/dL — ABNORMAL LOW (ref 8.9–10.3)
Chloride: 112 mmol/L — ABNORMAL HIGH (ref 98–111)
Creatinine, Ser: 0.87 mg/dL (ref 0.44–1.00)
GFR, Estimated: >60 mL/min (ref >60)
Glucose, Bld: 104 mg/dL — ABNORMAL HIGH (ref 70–99)
Potassium: 3.2 mmol/L — ABNORMAL LOW (ref 3.5–5.1)
Sodium: 140 mmol/L (ref 135–145)

## 2021-03-05 LAB — CBC
HCT: 34.3 % — ABNORMAL LOW (ref 36.0–46.0)
Hemoglobin: 11.8 g/dL — ABNORMAL LOW (ref 12.0–15.0)
MCH: 31.6 pg (ref 26.0–34.0)
MCHC: 34.4 g/dL (ref 30.0–36.0)
MCV: 91.7 fL (ref 80.0–100.0)
Platelets: 266 10*3/uL (ref 150–400)
RBC: 3.74 MIL/uL — ABNORMAL LOW (ref 3.87–5.11)
RDW: 13.9 % (ref 11.5–15.5)
WBC: 10 10*3/uL (ref 4.0–10.5)
nRBC: 0.2 % (ref 0.0–0.2)

## 2021-03-05 LAB — GLUCOSE, CAPILLARY
Glucose-Capillary: 116 mg/dL — ABNORMAL HIGH (ref 70–99)
Glucose-Capillary: 153 mg/dL — ABNORMAL HIGH (ref 70–99)
Glucose-Capillary: 108 mg/dL — ABNORMAL HIGH (ref 70–99)
Glucose-Capillary: 97 mg/dL (ref 70–99)

## 2021-03-05 MED ORDER — POTASSIUM CHLORIDE CRYS ER 20 MEQ PO TBCR
40.0000 meq | EXTENDED_RELEASE_TABLET | Freq: Three times a day (TID) | ORAL | Status: DC
  Administered 2021-03-05 – 2021-03-09 (×13): 40 meq via ORAL
  Filled 2021-03-05 (×13): qty 2

## 2021-03-05 NOTE — Progress Notes (Signed)
[facility] Day(s): 7.   Post op day(s): 7 Days Post-Op.   Interval History:  Patient seen and examined No acute events or new complaints overnight. Had scond drani placed with IR yesterday (02/01); output feculent; measured 10 ccs This morning, she is doing better; sore at drain sites No fever, chills, nausea, emesis Leukocytosis is resolved; 10.0K Renal function normal; sCr - 0.87; UO - unmeasured Mild hypokalemia to 3.2; no other electrolyte derangements She is on CLD; tolerating. Hungry  Surgical drain output is unmeasured; serous  Vital signs in last 24 hours: [min-max] current  Temp:  [97.4 F (36.3 C)-98.8 F (37.1 C)] 97.4 F (36.3 C) (02/02 0747) Pulse Rate:  [64-83] 64 (02/02 0747) Resp:  [15-23] 16 (02/02 0747) BP: (103-145)/(61-82) 136/65 (02/02 0747) SpO2:  [92 %-97 %] 94 % (02/02 0747)     Height: 5\' 8"  (172.7 cm) Weight: 107.5 kg BMI (Calculated): 36.04   Intake/Output last 2 shifts:  02/01 0701 - 02/02 0700 In: 1617.2 [I.V.:1374.5; IV Piggyback:242.6] Out: 10 [Drains:10]   Physical Exam:  Constitutional: alert, cooperative and no distress  Respiratory: breathing non-labored at rest  Cardiovascular: regular rate and sinus rhythm  Gastrointestinal: Soft, lower abdominal soreness, non-distended, no rebound/guarding. Surgical drain in RLQ; output is more serous this morning with some murky fluid. Left gluteal drain with feculent output Integumentary: Laparoscopic incisions are CDI with dermabond, no erythema or drainage   Labs:  CBC Latest Ref Rng & Units 03/05/2021 03/04/2021 03/03/2021  WBC 4.0 - 10.5 K/uL 10.0 12.1(H) 13.2(H)  Hemoglobin 12.0 - 15.0 g/dL 11.8(L) 11.2(L) 10.9(L)  Hematocrit 36.0 - 46.0 % 34.3(L) 31.4(L) 31.3(L)  Platelets 150 - 400 K/uL 266 253 230   CMP Latest Ref Rng & Units 03/05/2021 03/04/2021 03/03/2021  Glucose 70 - 99 mg/dL 104(H) 85 84  BUN 8 - 23 mg/dL 7(L) 11 12  Creatinine 0.44 - 1.00  mg/dL 0.87 0.85 0.80  Sodium 135 - 145 mmol/L 140 141 139  Potassium 3.5 - 5.1 mmol/L 3.2(L) 3.3(L) 3.1(L)  Chloride 98 - 111 mmol/L 112(H) 112(H) 109  CO2 22 - 32 mmol/L 21(L) 22 22  Calcium 8.9 - 10.3 mg/dL 8.0(L) 8.1(L) 8.3(L)  Total Protein 6.5 - 8.1 g/dL - - -  Total Bilirubin 0.3 - 1.2 mg/dL - - -  Alkaline Phos 38 - 126 U/L - - -  AST 15 - 41 U/L - - -  ALT 0 - 44 U/L - - -     Imaging studies:  No new pertinent imaging studies    Assessment/Plan:  66 y.o. female with anastomotic leak, contained, 7 Days Post-Op s/p robotic and hand assisted laparoscopic sigmoid colectomy with EEA for history of complicated diverticulitis with abscess to liver.   - Okay to advance to soft diet  - Appreciate IR assistance with drainage; monitor output; will need flushed frequently given nature of output; 5 ccs NS q8  - Monitor surgical drain; output diminished; serous; may be able to remove   - Will hold off on further surgical intervention for now. Attempt management conservatively. May need additional drain potentially. She understands further intervention surgically may result in colostomy vs ileostomy. Will follow closely  - Replete K+; monitor; stable  - Continue IVF support  - Continue IV Abx (Zosyn) - Pain control prn; antiemetics prn   - Morning labs; CBC, BMP - Mobilization as tolerated  - Okay to resume lovenox today (02/02) \     - Discharge  Planning: she may be ready for DC in next 24 - 48 hours  All of the above findings and recommendations were discussed with the patient, and the medical team, and all of patient's questions were answered to her expressed satisfaction.  -- Edison Simon, [name] [facility] 03/05/2021, 8:15 AM [phone] M-F: 7am - 4pm

## 2021-03-05 NOTE — Care Management Important Message (Signed)
Important Message  Patient Details  Name: Stacy Cox MRN: [id number] Date of Birth: 08-Aug-1955   Medicare Important Message Given:  Yes     Dannette Barbara 03/05/2021, 2:02 PM

## 2021-03-06 LAB — GLUCOSE, CAPILLARY
Glucose-Capillary: 98 mg/dL (ref 70–99)
Glucose-Capillary: 126 mg/dL — ABNORMAL HIGH (ref 70–99)
Glucose-Capillary: 84 mg/dL (ref 70–99)
Glucose-Capillary: 82 mg/dL (ref 70–99)

## 2021-03-06 LAB — CBC
HCT: 31.2 % — ABNORMAL LOW (ref 36.0–46.0)
Hemoglobin: 10.8 g/dL — ABNORMAL LOW (ref 12.0–15.0)
MCH: 32 pg (ref 26.0–34.0)
MCHC: 34.6 g/dL (ref 30.0–36.0)
MCV: 92.6 fL (ref 80.0–100.0)
Platelets: 294 10*3/uL (ref 150–400)
RBC: 3.37 MIL/uL — ABNORMAL LOW (ref 3.87–5.11)
RDW: 14.4 % (ref 11.5–15.5)
WBC: 13.8 10*3/uL — ABNORMAL HIGH (ref 4.0–10.5)
nRBC: 0.1 % (ref 0.0–0.2)

## 2021-03-06 LAB — BASIC METABOLIC PANEL
Anion gap: 9 (ref 5–15)
BUN: 5 mg/dL — ABNORMAL LOW (ref 8–23)
CO2: 22 mmol/L (ref 22–32)
Calcium: 8.2 mg/dL — ABNORMAL LOW (ref 8.9–10.3)
Chloride: 108 mmol/L (ref 98–111)
Creatinine, Ser: 0.82 mg/dL (ref 0.44–1.00)
GFR, Estimated: >60 mL/min (ref >60)
Glucose, Bld: 105 mg/dL — ABNORMAL HIGH (ref 70–99)
Potassium: 3.6 mmol/L (ref 3.5–5.1)
Sodium: 139 mmol/L (ref 135–145)

## 2021-03-06 NOTE — Progress Notes (Signed)
Mobility Specialist - Progress Note   03/06/21 1600  Mobility  Range of Motion/Exercises Left leg;Right leg  Level of Assistance Modified independent, requires aide device or extra time  Assistive Device None  Distance Ambulated (ft) 0 ft  Activity Response Tolerated well  $Mobility charge 1 Mobility   Pt was in bed upon arrival utilizing RA. Pt declined OOB activity despite max encouragement, but reports getting in and out of bed for bathroom use. Pt completed Therex with ModI, extra time needed d/t increased pain. Pt reports pain as "sharp" but L>R. Pt left in bed with needs in reach.  Kathee Delton Mobility Specialist 03/06/21, 4:38 PM

## 2021-03-06 NOTE — Progress Notes (Signed)
Pt had three bloody BM; this AM, the first was small and settled in the bottom of the toilet, it was dk red, the second was scant and the third was a medium to large and the entire toilet was red. Waiting on lab results. Pt asymptomatic at this time.

## 2021-03-06 NOTE — Progress Notes (Signed)
Stacy Cox Hospital Day(s): 8.   Post op day(s): 8 Days Post-Op.   Interval History:  Patient seen and examined Overnight, she had three bowel movements which were reportedly bloody. These were small amounts of dark blood at first but around 0500 she had a large bowel movement with bright red blood.  This morning, she otherwise is doing well aside from continued discomfort at drain sites No fever, chills, nausea, emesis Labs are pending this morning She is advanced to soft; tolerating well Surgical drain output is 12 ccs; serous with debris Left gluteal drain with 38 ccs out; feculent   Vital signs in last 24 hours: [min-max] current  Temp:  [98.3 F (36.8 C)-100 F (37.8 C)] 100 F (37.8 C) (02/03 0515) Pulse Rate:  [72-86] 86 (02/03 0515) Resp:  [16-20] 20 (02/03 0515) BP: (97-137)/(65-109) 137/65 (02/03 0515) SpO2:  [91 %-100 %] 91 % (02/03 0515)     Height: 5\' 8"  (172.7 cm) Weight: 107.5 kg BMI (Calculated): 36.04   Intake/Output last 2 shifts:  02/02 0701 - 02/03 0700 In: 1212.1 [I.V.:1071.8; IV Piggyback:135.4] Out: 50 [Drains:50]   Physical Exam:  Constitutional: alert, cooperative and no distress  Respiratory: breathing non-labored at rest  Cardiovascular: regular rate and sinus rhythm  Gastrointestinal: Soft, lower abdominal soreness, non-distended, no rebound/guarding. Surgical drain in RLQ; output is more serous this morning with some murky fluid. Left gluteal drain with feculent output Integumentary: Laparoscopic incisions are CDI with dermabond, no erythema or drainage   Labs:  CBC Latest Ref Rng & Units 03/05/2021 03/04/2021 03/03/2021  WBC 4.0 - 10.5 K/uL 10.0 12.1(H) 13.2(H)  Hemoglobin 12.0 - 15.0 g/dL 11.8(L) 11.2(L) 10.9(L)  Hematocrit 36.0 - 46.0 % 34.3(L) 31.4(L) 31.3(L)  Platelets 150 - 400 K/uL 266 253 230   CMP Latest Ref Rng & Units 03/05/2021 03/04/2021 03/03/2021  Glucose 70 - 99 mg/dL 104(H) 85 84  BUN 8 - 23  mg/dL 7(L) 11 12  Creatinine 0.44 - 1.00 mg/dL 0.87 0.85 0.80  Sodium 135 - 145 mmol/L 140 141 139  Potassium 3.5 - 5.1 mmol/L 3.2(L) 3.3(L) 3.1(L)  Chloride 98 - 111 mmol/L 112(H) 112(H) 109  CO2 22 - 32 mmol/L 21(L) 22 22  Calcium 8.9 - 10.3 mg/dL 8.0(L) 8.1(L) 8.3(L)  Total Protein 6.5 - 8.1 g/dL - - -  Total Bilirubin 0.3 - 1.2 mg/dL - - -  Alkaline Phos 38 - 126 U/L - - -  AST 15 - 41 U/L - - -  ALT 0 - 44 U/L - - -     Imaging studies:  No new pertinent imaging studies    Assessment/Plan:  66 y.o. female with anastomotic leak, contained, 8 Days Post-Op s/p robotic and hand assisted laparoscopic sigmoid colectomy with EEA for history of complicated diverticulitis with abscess to liver.   - Okay to continue soft diet  - Pending repeat H&H  - Appreciate IR assistance with drainage; monitor output; will need flushed frequently given nature of output; 5 ccs NS q8  - Monitor surgical drain; output diminished; serous + debris   - Will hold off on further surgical intervention for now. Attempt management conservatively. May need additional drain potentially. She understands further intervention surgically may result in colostomy vs ileostomy. Will follow closely  - Discontinue IVF support  - Continue IV Abx (Zosyn) - Pain control prn; antiemetics prn  - Mobilization as tolerated; encouraged her to continue to get OOB - Hold Lovenox in setting of  potential GI bleeding       - Discharge Planning: Pending repeat CBC, monitor for bloody BMs. Depending on clinical condition, may be able to DC over the weekend.   All of the above findings and recommendations were discussed with the patient, and the medical team, and all of patient's questions were answered to her expressed satisfaction.  -- Edison Simon, [name] [facility] 03/06/2021, 7:49 AM [phone] M-F: 7am - 4pm

## 2021-03-07 LAB — COMPREHENSIVE METABOLIC PANEL
ALT: 14 U/L (ref 0–44)
AST: 17 U/L (ref 15–41)
Albumin: 2.4 g/dL — ABNORMAL LOW (ref 3.5–5.0)
Alkaline Phosphatase: 125 U/L (ref 38–126)
Anion gap: 7 (ref 5–15)
BUN: <5 mg/dL — ABNORMAL LOW (ref 8–23)
CO2: 23 mmol/L (ref 22–32)
Calcium: 8.4 mg/dL — ABNORMAL LOW (ref 8.9–10.3)
Chloride: 108 mmol/L (ref 98–111)
Creatinine, Ser: 0.91 mg/dL (ref 0.44–1.00)
GFR, Estimated: >60 mL/min (ref >60)
Glucose, Bld: 87 mg/dL (ref 70–99)
Potassium: 3.9 mmol/L (ref 3.5–5.1)
Sodium: 138 mmol/L (ref 135–145)
Total Bilirubin: 0.7 mg/dL (ref 0.3–1.2)
Total Protein: 6 g/dL — ABNORMAL LOW (ref 6.5–8.1)

## 2021-03-07 LAB — GLUCOSE, CAPILLARY
Glucose-Capillary: 88 mg/dL (ref 70–99)
Glucose-Capillary: 112 mg/dL — ABNORMAL HIGH (ref 70–99)
Glucose-Capillary: 62 mg/dL — ABNORMAL LOW (ref 70–99)
Glucose-Capillary: 78 mg/dL (ref 70–99)
Glucose-Capillary: 110 mg/dL — ABNORMAL HIGH (ref 70–99)

## 2021-03-07 NOTE — Progress Notes (Signed)
Patient ID: Stacy Cox, female   DOB: 11/21/1955, 66 y.o.   MRN: [id number]     [facility] Day(s): 9.   Interval History: Patient seen and examined, no acute events or new complaints overnight. Patient reports continued having some pain.  Pain mostly on lower abdomen.  Pain something controlled with current pain medications.  Denies any nausea or vomiting  Vital signs in last 24 hours: [min-max] current  Temp:  [98 F (36.7 C)-98.5 F (36.9 C)] 98.5 F (36.9 C) (02/04 0804) Pulse Rate:  [62-73] 72 (02/04 0804) Resp:  [18-20] 18 (02/04 0804) BP: (97-129)/(60-84) 129/84 (02/04 0804) SpO2:  [92 %-100 %] 92 % (02/04 0804)     Height: 5\' 8"  (172.7 cm) Weight: 107.5 kg BMI (Calculated): 36.04   Physical Exam:  Constitutional: alert, cooperative and no distress  Respiratory: breathing non-labored at rest  Cardiovascular: regular rate and sinus rhythm  Gastrointestinal: soft, non-tender, and non-distended.  2 drains in place.  Surgical drain with purulent output.  IR drain with dark feculent output  Labs:  CBC Latest Ref Rng & Units 03/06/2021 03/05/2021 03/04/2021  WBC 4.0 - 10.5 K/uL 13.8(H) 10.0 12.1(H)  Hemoglobin 12.0 - 15.0 g/dL 10.8(L) 11.8(L) 11.2(L)  Hematocrit 36.0 - 46.0 % 31.2(L) 34.3(L) 31.4(L)  Platelets 150 - 400 K/uL 294 266 253   CMP Latest Ref Rng & Units 03/07/2021 03/06/2021 03/05/2021  Glucose 70 - 99 mg/dL 87 105(H) 104(H)  BUN 8 - 23 mg/dL <5(L) 5(L) 7(L)  Creatinine 0.44 - 1.00 mg/dL 0.91 0.82 0.87  Sodium 135 - 145 mmol/L 138 139 140  Potassium 3.5 - 5.1 mmol/L 3.9 3.6 3.2(L)  Chloride 98 - 111 mmol/L 108 108 112(H)  CO2 22 - 32 mmol/L 23 22 21(L)  Calcium 8.9 - 10.3 mg/dL 8.4(L) 8.2(L) 8.0(L)  Total Protein 6.5 - 8.1 g/dL 6.0(L) - -  Total Bilirubin 0.3 - 1.2 mg/dL 0.7 - -  Alkaline Phos 38 - 126 U/L 125 - -  AST 15 - 41 U/L 17 - -  ALT 0 - 44 U/L 14 - -    Imaging studies: No new pertinent imaging studies   Assessment/Plan:  66 y.o.  female with history of complicated diverticulitis 9 Days Post-Op s/p robotic assisted laparoscopic sigmoid colectomy.  -Patient tolerating soft diet -Drains still without serous output 1 is purulent and wanted feculent.  Drain needs to stay in place. -No fever -Minimal elevation of white blood cell count -We will continue with IV antibiotic therapy -We will continue with pain management -I encouraged the patient to ambulate  Arnold Long, MD

## 2021-03-07 NOTE — Progress Notes (Addendum)
Pt's Glucose level 62, repeated CBG is 78 with no interventions. Please consider making changes to insulin orders. PA Olean Ree made aware via chat messaging.   1830 Pt states she found another "hard spot" on her abdomen, RLQ on the side, it feels "tender" per pt and wanted me to make the doctor aware. MD Cintron-Diaz made aware.

## 2021-03-08 LAB — GLUCOSE, CAPILLARY
Glucose-Capillary: 104 mg/dL — ABNORMAL HIGH (ref 70–99)
Glucose-Capillary: 104 mg/dL — ABNORMAL HIGH (ref 70–99)
Glucose-Capillary: 133 mg/dL — ABNORMAL HIGH (ref 70–99)
Glucose-Capillary: 105 mg/dL — ABNORMAL HIGH (ref 70–99)

## 2021-03-08 NOTE — Progress Notes (Signed)
Patient ID: Stacy Cox, female   DOB: 1956/01/01, 66 y.o.   MRN: [id number]     [facility] Hospital Day(s): 10.   Interval History: Patient seen and examined, no acute events or new complaints overnight. Patient reports feeling okay.  Denies significant abdominal pain.  Endorses tolerating diet.  She was complaining of palpable knot in the right flank.  Minimal tenderness.  Vital signs in last 24 hours: [min-max] current  Temp:  [97.9 F (36.6 C)-99 F (37.2 C)] 97.9 F (36.6 C) (02/05 0751) Pulse Rate:  [63-91] 63 (02/05 0751) Resp:  [16] 16 (02/05 0751) BP: (77-126)/(65-95) 126/72 (02/05 0751) SpO2:  [94 %-97 %] 95 % (02/05 0751)     Height: 5\' 8"  (172.7 cm) Weight: 107.5 kg BMI (Calculated): 36.04   Physical Exam:  Constitutional: alert, cooperative and no distress  Respiratory: breathing non-labored at rest  Cardiovascular: regular rate and sinus rhythm  Gastrointestinal: soft, non-tender, and non-distended.  There is a 2 cm not palpable on the right flank.  Not fluctuant.  No cellulitis or erythema.  Labs:  CBC Latest Ref Rng & Units 03/06/2021 03/05/2021 03/04/2021  WBC 4.0 - 10.5 K/uL 13.8(H) 10.0 12.1(H)  Hemoglobin 12.0 - 15.0 g/dL 10.8(L) 11.8(L) 11.2(L)  Hematocrit 36.0 - 46.0 % 31.2(L) 34.3(L) 31.4(L)  Platelets 150 - 400 K/uL 294 266 253   CMP Latest Ref Rng & Units 03/07/2021 03/06/2021 03/05/2021  Glucose 70 - 99 mg/dL 87 105(H) 104(H)  BUN 8 - 23 mg/dL <5(L) 5(L) 7(L)  Creatinine 0.44 - 1.00 mg/dL 0.91 0.82 0.87  Sodium 135 - 145 mmol/L 138 139 140  Potassium 3.5 - 5.1 mmol/L 3.9 3.6 3.2(L)  Chloride 98 - 111 mmol/L 108 108 112(H)  CO2 22 - 32 mmol/L 23 22 21(L)  Calcium 8.9 - 10.3 mg/dL 8.4(L) 8.2(L) 8.0(L)  Total Protein 6.5 - 8.1 g/dL 6.0(L) - -  Total Bilirubin 0.3 - 1.2 mg/dL 0.7 - -  Alkaline Phos 38 - 126 U/L 125 - -  AST 15 - 41 U/L 17 - -  ALT 0 - 44 U/L 14 - -    Imaging studies: No new pertinent imaging studies   Assessment/Plan:  66  y.o. female with history of complicated diverticulitis 10 Days Post-Op s/p robotic assisted laparoscopic sigmoid colectomy.   -Patient tolerating soft diet.  We will continue with same diet -Drains still without serous output.  The right side pain is purulent and IR drain is watery brown.  Drain needs to stay in place. -No fever or changes on physical exam -We will continue with IV antibiotic therapy -We will continue with pain management -I encouraged the patient to ambulate  Arnold Long, MD

## 2021-03-08 NOTE — Plan of Care (Signed)

## 2021-03-09 LAB — COMPREHENSIVE METABOLIC PANEL
ALT: 13 U/L (ref 0–44)
AST: 13 U/L — ABNORMAL LOW (ref 15–41)
Albumin: 2.7 g/dL — ABNORMAL LOW (ref 3.5–5.0)
Alkaline Phosphatase: 91 U/L (ref 38–126)
Anion gap: 7 (ref 5–15)
BUN: 7 mg/dL — ABNORMAL LOW (ref 8–23)
CO2: 26 mmol/L (ref 22–32)
Calcium: 8.9 mg/dL (ref 8.9–10.3)
Chloride: 104 mmol/L (ref 98–111)
Creatinine, Ser: 0.88 mg/dL (ref 0.44–1.00)
GFR, Estimated: >60 mL/min (ref >60)
Glucose, Bld: 116 mg/dL — ABNORMAL HIGH (ref 70–99)
Potassium: 4.2 mmol/L (ref 3.5–5.1)
Sodium: 137 mmol/L (ref 135–145)
Total Bilirubin: 0.4 mg/dL (ref 0.3–1.2)
Total Protein: 6.6 g/dL (ref 6.5–8.1)

## 2021-03-09 LAB — AEROBIC/ANAEROBIC CULTURE W GRAM STAIN (SURGICAL/DEEP WOUND)

## 2021-03-09 LAB — GLUCOSE, CAPILLARY
Glucose-Capillary: 140 mg/dL — ABNORMAL HIGH (ref 70–99)
Glucose-Capillary: 88 mg/dL (ref 70–99)

## 2021-03-09 MED ORDER — SODIUM CHLORIDE 0.9% FLUSH: 5.0000 mL | Freq: Every day | INTRAVENOUS | 0 refills | Status: DC

## 2021-03-09 MED ORDER — PREGABALIN 100 MG PO CAPS
100.0000 mg | ORAL_CAPSULE | Freq: Three times a day (TID) | ORAL | 0 refills | Status: DC
Start: 2021-03-09 — End: 2021-04-14

## 2021-03-09 MED ORDER — AMOXICILLIN-POT CLAVULANATE 875-125 MG PO TABS: 1.0000 | ORAL_TABLET | Freq: Two times a day (BID) | ORAL | 0 refills | Status: AC

## 2021-03-09 MED ORDER — LOPERAMIDE HCL 2 MG PO CAPS: 2.0000 mg | ORAL_CAPSULE | Freq: Two times a day (BID) | ORAL | 0 refills | Status: AC

## 2021-03-09 MED ORDER — OXYCODONE HCL 5 MG PO TABS: 5.0000 mg | ORAL_TABLET | ORAL | 0 refills | Status: DC | PRN

## 2021-03-09 MED ORDER — IBUPROFEN 600 MG PO TABS: 600.0000 mg | ORAL_TABLET | Freq: Four times a day (QID) | ORAL | 0 refills | Status: DC | PRN

## 2021-03-09 NOTE — Plan of Care (Signed)
°  Problem: Education: Goal: Knowledge of General Education information will improve Description: Including pain rating scale, medication(s)/side effects and non-pharmacologic comfort measures Outcome: Adequate for Discharge   Problem: Health Behavior/Discharge Planning: Goal: Ability to manage health-related needs will improve Outcome: Adequate for Discharge   Problem: Clinical Measurements: Goal: Ability to maintain clinical measurements within normal limits will improve Outcome: Adequate for Discharge Goal: Will remain free from infection Outcome: Adequate for Discharge Goal: Diagnostic test results will improve Outcome: Adequate for Discharge Goal: Respiratory complications will improve Outcome: Adequate for Discharge Goal: Cardiovascular complication will be avoided Outcome: Adequate for Discharge   Problem: Activity: Goal: Risk for activity intolerance will decrease Outcome: Adequate for Discharge   Problem: Coping: Goal: Level of anxiety will decrease Outcome: Adequate for Discharge   Problem: Elimination: Goal: Will not experience complications related to bowel motility Outcome: Adequate for Discharge Goal: Will not experience complications related to urinary retention Outcome: Adequate for Discharge   Problem: Pain Managment: Goal: General experience of comfort will improve Outcome: Adequate for Discharge   Problem: Safety: Goal: Ability to remain free from injury will improve Outcome: Adequate for Discharge   Problem: Skin Integrity: Goal: Risk for impaired skin integrity will decrease Outcome: Adequate for Discharge   

## 2021-03-09 NOTE — Discharge Summary (Signed)
[facility] SURGICAL DISCHARGE SUMMARY Patient ID: Stacy Cox MRN: [id number] DOB/AGE: [birth year] 66 y.o.  Admit date: 02/26/2021 Discharge date: 03/09/2021  Discharge Diagnoses Patient Active Problem List   Diagnosis Date Noted   S/P laparoscopic colectomy 02/26/2021   Liver abscess 11/02/2020   CHF (congestive heart failure) ([place])    Acute diverticulitis 05/02/2015   Sepsis secondary to acute diverticulitis 05/02/2015    Consultants None  Procedures 02/26/2021:  1. Robotic assisted Laparoscopic Low anterior resection 2. Robotic Laparoscopic takedown of splenic flexure 3. Perfusion check of Anastomotic pedicle using ICG  HPI: Stacy Cox is a 66 y.o. female with a history of complicated diverticulitis with liver abscesses who presents to [facility] on 01/26 for scheduled sigmoid colectomy with EEA with Dr Dahlia Byes.   Hospital Course: Informed consent was obtained and documented, and patient underwent uneventful robotic assisted laparoscopic sigmoid colectomy with EEA (Dr Dahlia Byes, 02/26/2021). Initially, patient did well for the first day or so post-operatively. On POD3, she had an abrupt change in the quality of her surgical drain. Repeat CT Abdomen/pelvis was concerning for anastomotic leak. She fortunately was not toxic from this. She would have repeat CT Abdomen/Pelvis on 02/01 which still showed contained leak. Images were reviewed with IR who was able to place left transgluteal drain. Advancement of patient's diet and ambulation were well-tolerated. The remainder of patient's hospital course was essentially unremarkable, and discharge planning was initiated accordingly with patient safely able to be discharged home with appropriate discharge instructions, antibiotics (Augmentin x7 days), pain control, and outpatient follow-up after all of her questions were answered to her expressed satisfaction.   Discharge Condition: Good   Physical Examination:  Constitutional:  alert, cooperative and no distress  Respiratory: breathing non-labored at rest  Cardiovascular: regular rate and sinus rhythm  Gastrointestinal: Soft, lower abdominal soreness, non-distended, no rebound/guarding. Surgical drain in RLQ; output is seropurulent. Left gluteal drain with feculent output Integumentary: Laparoscopic incisions are CDI with dermabond, no erythema or drainage      Allergies as of 03/09/2021   No Known Allergies      Medication List     STOP taking these medications    bisacodyl 5 MG EC tablet Commonly known as: DULCOLAX   metroNIDAZOLE 500 MG tablet Commonly known as: FLAGYL   neomycin 500 MG tablet Commonly known as: MYCIFRADIN   polyethylene glycol powder 17 GM/SCOOP powder Commonly known as: MiraLax       TAKE these medications    amiodarone 200 MG tablet Commonly known as: PACERONE Take 1 tablet (200 mg total) by mouth daily.   amoxicillin-clavulanate 875-125 MG tablet Commonly known as: Augmentin Take 1 tablet by mouth 2 (two) times daily for 7 days.   apixaban 5 MG Tabs tablet Commonly known as: ELIQUIS Take 1 tablet (5 mg total) by mouth 2 (two) times daily.   carvedilol 20 MG 24 hr capsule Commonly known as: COREG CR Take 20 mg by mouth daily.   diclofenac Sodium 1 % Gel Commonly known as: VOLTAREN Apply 2 g topically daily as needed (joint pain).   furosemide 40 MG tablet Commonly known as: LASIX Take 40 mg by mouth daily.   gabapentin 100 MG capsule Commonly known as: NEURONTIN Take 100-200 mg by mouth 3 (three) times daily as needed (pain).   ibuprofen 600 MG tablet Commonly known as: ADVIL Take 1 tablet (600 mg total) by mouth every 6 (six) hours as needed.   loperamide 2 MG capsule Commonly known as: IMODIUM Take 1 capsule (  2 mg total) by mouth 2 (two) times daily.   metFORMIN 500 MG tablet Commonly known as: GLUCOPHAGE Take 500 mg by mouth daily with breakfast.   MULTIVITAL PO Take 1 tablet by mouth  daily.   nicotine 21 mg/24hr patch Commonly known as: Nicoderm CQ Place 1 patch (21 mg total) onto the skin daily.   oxyCODONE 5 MG immediate release tablet Commonly known as: Oxy IR/ROXICODONE Take 1 tablet (5 mg total) by mouth every 4 (four) hours as needed for severe pain or breakthrough pain.   pantoprazole 40 MG tablet Commonly known as: PROTONIX Take 40 mg by mouth daily.   potassium chloride SA 20 MEQ tablet Commonly known as: KLOR-CON M Take 20 mEq by mouth daily.   pregabalin 100 MG capsule Commonly known as: LYRICA Take 1 capsule (100 mg total) by mouth 3 (three) times daily for 14 days.   sodium chloride flush 0.9 % Soln Commonly known as: NS Inject 5 mLs into the vein daily. Flush left gluteal drain daily with 5 ccs of NS   spironolactone 25 MG tablet Commonly known as: ALDACTONE Take 1 tablet by mouth daily.   vitamin B-12 1000 MCG tablet Commonly known as: CYANOCOBALAMIN Take 1,000 mcg by mouth daily.   vitamin C 1000 MG tablet Take 1,000 mg by mouth daily.   Vitamin D 50 MCG (2000 UT) Caps Take 2,000 Units by mouth daily.   zinc gluconate 50 MG tablet Take 50 mg by mouth daily.          Follow-up Information     [name], [place] F, MD. Schedule an appointment as soon as possible for a visit in 10 day(s).   Specialty: General Surgery Why: follow up in 10-14 days, s/p sigmoid colectomy complicated by anastomotic leak, has drains Contact information: [address] [place] [address] [phone]                  Time spent on discharge management including discussion of hospital course, clinical condition, outpatient instructions, prescriptions, and follow up with the patient and members of the medical team: >30 minutes  -- Edison Simon , [name] [facility]  03/09/2021, 9:02 AM [phone] M-F: 7am - 4pm

## 2021-03-09 NOTE — Discharge Instructions (Addendum)
In addition to included general post-operative instructions,  Diet: Resume home diet.   Activity: No heavy lifting >20 pounds (children, pets, laundry, garbage) or strenuous activity for 6 weeks from date of surgery (01/26), but light activity and walking are encouraged. Do not drive or drink alcohol if taking narcotic pain medications or having pain that might distract from driving.  Wound/Drain Care: Monitor and record drain output as least once daily. I provided you a handout to do so. Change outer dressing every 2-3 days or as needed if they are saturated.  Flush left gluteal drain 1-3 times daily with 5 ccs of NS; I did given written prescription for this.   Medications: Resume all home medications. For mild to moderate pain: acetaminophen (Tylenol) or ibuprofen/naproxen (if no kidney disease). Combining Tylenol with alcohol can substantially increase your risk of causing liver disease. Narcotic pain medications, if prescribed, can be used for severe pain, though may cause nausea, constipation, and drowsiness. Do not combine Tylenol and Percocet (or similar) within a 6 hour period as Percocet (and similar) contain(s) Tylenol. If you do not need the narcotic pain medication, you do not need to fill the prescription.  Call office [phone] / [phone]) at any time if any questions, worsening pain, fevers/chills, bleeding, drainage from incision site, or other concerns.

## 2021-03-18 ENCOUNTER — Encounter: Payer: Self-pay | Admitting: Gastroenterology

## 2021-03-18 ENCOUNTER — Other Ambulatory Visit: Payer: Self-pay

## 2021-03-18 ENCOUNTER — Ambulatory Visit (INDEPENDENT_AMBULATORY_CARE_PROVIDER_SITE_OTHER): Payer: Medicare HMO | Admitting: Gastroenterology

## 2021-03-18 VITALS — BP 95/64 | HR 92 | Temp 98.1°F | Ht 68.0 in | Wt 232.0 lb

## 2021-03-18 DIAGNOSIS — Z8719 Personal history of other diseases of the digestive system: Secondary | ICD-10-CM

## 2021-03-18 NOTE — Progress Notes (Signed)
Jonathon Bellows MD, MRCP(U.K) [address]  [place]  [place], [place] [address]  Main: [phone]  Fax: [phone]   Gastroenterology Consultation  Referring Provider:     Donnie Coffin, MD Primary Care Physician:  Donnie Coffin, MD Primary Gastroenterologist:  Dr. Jonathon Bellows  Reason for Consultation:     Diverticulitis        HPI:   Stacy Cox is a 66 y.o. y/o female referred for consultation & management  by Dr. Clide Deutscher, Edmonia Lynch, MD.    The patient has had a few episodes of diverticulitis in the past.  Had a colonoscopy by Dr. Bonna Gains in December 2022 and was found to have a few polyps that were resected in addition to diverticulosis of the colon that was noted.  The polyps resected were tubular adenomas.  Subsequently the patient underwent a robotic assisted sigmoid colectomy for complicated diverticulitis.  The group procedure was complicated with a postoperative leak from the anastomosis.  Since the surgery she is doing well.  She still has a drain in the right lower quadrant of abdomen.  Some soreness in the lower part of her abdomen.  Past Medical History:  Diagnosis Date   CHF (congestive heart failure) (HCC)    COPD (chronic obstructive pulmonary disease) (HCC)    Diabetes mellitus, type 2 (HCC)    GERD (gastroesophageal reflux disease)    Heart disease    Hypertension    Joint pain    Polycythemia vera ([place])    Sleep apnea    Tobacco abuse     Past Surgical History:  Procedure Laterality Date   COLONOSCOPY  2017   COLONOSCOPY WITH PROPOFOL N/A 01/07/2021   Procedure: COLONOSCOPY WITH PROPOFOL;  Surgeon: [name], MD;  Location: [facility] ENDOSCOPY;  Service: Endoscopy;  Laterality: N/A;   IR CATHETER TUBE CHANGE  11/25/2020   IR CATHETER TUBE CHANGE  11/25/2020   IR RADIOLOGIST EVAL & MGMT  11/19/2020   IR RADIOLOGIST EVAL & MGMT  12/02/2020   IR RADIOLOGIST EVAL & MGMT  12/09/2020    Prior to Admission medications   Medication Sig  Start Date End Date Taking? Authorizing Provider  amiodarone (PACERONE) 200 MG tablet Take 1 tablet (200 mg total) by mouth daily. 11/08/20   [name], MD  apixaban (ELIQUIS) 5 MG TABS tablet Take 1 tablet (5 mg total) by mouth 2 (two) times daily. 11/07/20   [name], MD  Ascorbic Acid (VITAMIN C) 1000 MG tablet Take 1,000 mg by mouth daily.    [provider]  carvedilol (COREG CR) 20 MG 24 hr capsule Take 20 mg by mouth daily.    [provider]  Cholecalciferol (VITAMIN D) 50 MCG (2000 UT) CAPS Take 2,000 Units by mouth daily.    [provider]  diclofenac Sodium (VOLTAREN) 1 % GEL Apply 2 g topically daily as needed (joint pain). 12/29/20   [provider]  furosemide (LASIX) 40 MG tablet Take 40 mg by mouth daily. 11/19/14   [provider]  gabapentin (NEURONTIN) 100 MG capsule Take 100-200 mg by mouth 3 (three) times daily as needed (pain). 10/20/20   [provider]  ibuprofen (ADVIL) 600 MG tablet Take 1 tablet (600 mg total) by mouth every 6 (six) hours as needed. 03/09/21   [name], [name]  loperamide (IMODIUM) 2 MG capsule Take 1 capsule (2 mg total) by mouth 2 (two) times daily. 03/09/21   [name], [name]  metFORMIN (GLUCOPHAGE) 500 MG tablet Take 500 mg by mouth daily with breakfast. 11/19/14   [provider]  Multiple Vitamins-Minerals (MULTIVITAL PO) Take 1 tablet by mouth daily.    [provider]  nicotine (NICODERM CQ) 21 mg/24hr patch Place 1 patch (21 mg total) onto the skin daily. 01/19/21   [name], [name], MD  oxyCODONE (OXY IR/ROXICODONE) 5 MG immediate release tablet Take 1 tablet (5 mg total) by mouth every 4 (four) hours as needed for severe pain or breakthrough pain. 03/09/21   [name], [name]  pantoprazole (PROTONIX) 40 MG tablet Take 40 mg by mouth daily. 11/19/14   [provider]  potassium chloride SA (K-DUR,KLOR-CON) 20 MEQ tablet Take 20 mEq by mouth daily.  11/20/14   [provider]  pregabalin (LYRICA) 100 MG capsule Take 1 capsule (100 mg total) by mouth 3 (three) times daily for 14 days. 03/09/21 03/23/21  [name], [name]  sodium chloride flush (NS) 0.9 % SOLN Inject 5 mLs into the vein daily. Flush left gluteal drain daily with 5 ccs of NS 03/09/21 05/08/21  [name], [name]  spironolactone (ALDACTONE) 25 MG tablet Take 1 tablet by mouth daily. 11/19/14   [provider]  vitamin B-12 (CYANOCOBALAMIN) 1000 MCG tablet Take 1,000 mcg by mouth daily.    [provider]  zinc gluconate 50 MG tablet Take 50 mg by mouth daily.    [provider]    Family History  Problem Relation Age of Onset   Breast cancer Sister        sister #1 age 55   Kidney cancer Sister        sister #1 age -1's   Breast cancer Sister        sister #2 age 108's   Kidney disease Sister        sister #2 age 14's   Diabetes Mother    Heart disease Mother        + BYPASS SURGERY   Deep vein thrombosis Mother    Arthritis Mother    Diabetes Sister    Heart disease Sister    Hypertension Brother    Heart attack Father      Social History   Tobacco Use   Smoking status: Every Day    Packs/day: 1.00    Years: 40.00    Pack years: 40.00    Types: Cigarettes   Smokeless tobacco: Former    Types: Snuff   Tobacco comments:    smoking since teens  Vaping Use   Vaping Use: Former  Substance Use Topics   Alcohol use: No    Alcohol/week: 0.0 standard drinks   Drug use: No    Allergies as of 03/18/2021   (No Known Allergies)    Review of Systems:    All systems reviewed and negative except where noted in HPI.   Physical Exam:  BP 95/64    Pulse 92    Temp 98.1 F (36.7 C) (Oral)    Ht 5\' 8"  (1.727 m)    Wt 232 lb (105.2 kg)    BMI 35.28 kg/m  No LMP recorded. Patient is postmenopausal. Psych:  Alert and cooperative. Normal mood and affect. General:   Alert,  Well-developed, well-nourished, pleasant and  cooperative in NAD Head:  Normocephalic and atraumatic. Eyes:  Sclera clear, no icterus.   Conjunctiva pink. Ears:  Normal auditory acuity.. Abdomen:  Normal bowel sounds.  No bruits.  Soft, non-tender and  non-distended without masses, hepatosplenomegaly or hernias noted.  No guarding or rebound tenderness.    Niel Hummer in the right lower quadrant mild lower abdominal tenderness no rebound or guarding Skin:  Intact without significant lesions or rashes. No jaundice. Lymph Nodes:  No significant cervical adenopathy. Psych:  Alert and cooperative. Normal mood and affect.  Imaging Studies: CT ABDOMEN PELVIS W CONTRAST  Result Date: 03/04/2021 CLINICAL DATA:  Postoperative abdominal pain, status post laparoscopic low anterior resection EXAM: CT ABDOMEN AND PELVIS WITH CONTRAST TECHNIQUE: Multidetector CT imaging of the abdomen and pelvis was performed using the standard protocol following bolus administration of intravenous contrast. RADIATION DOSE REDUCTION: This exam was performed according to the departmental dose-optimization program which includes automated exposure control, adjustment of the mA and/or kV according to patient size and/or use of iterative reconstruction technique. CONTRAST:  170mL OMNIPAQUE IOHEXOL 300 MG/ML SOLN, additional oral enteric contrast COMPARISON:  03/01/2021 FINDINGS: Lower chest: Small left pleural effusion, slightly increased compared to prior examination. Cardiomegaly. Three-vessel coronary artery calcifications Hepatobiliary: No solid liver abnormality is seen. No gallstones, gallbladder wall thickening, or biliary dilatation. Pancreas: Unremarkable. No pancreatic ductal dilatation or surrounding inflammatory changes. Spleen: Normal in size without significant abnormality. Adrenals/Urinary Tract: Adrenal glands are unremarkable. 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  These are presumably obstructed by abscess cavity in the right hemipelvis. Bladder is unremarkable. Stomach/Bowel: Stomach is within normal limits. Appendix appears normal. Status post low anterior resection and reanastomosis. There is an unchanged air and fluid collection about the anastomotic suture line in the low pelvis posterior to the uterus and anterior to the rectum with clear evidence of extraluminal contrast leak, this collection measuring approximately 10.3 x 4.8 cm (series 2, image 76). Vascular/Lymphatic: Aortic atherosclerosis. No enlarged abdominal or pelvic lymph nodes. Reproductive: No mass or other significant abnormality. Other: Anasarca. Unchanged subcutaneous emphysema about the right hemiabdomen (series 2, image 36). Small volume free fluid in the low abdomen and pelvis. Right ventral approach surgical drain is positioned about the low abdomen, tip in the left lower quadrant. Musculoskeletal: No acute or significant osseous findings. IMPRESSION: 1. Status post low anterior resection and reanastomosis. There is an unchanged air and fluid collection about the anastomotic suture line in the low pelvis posterior to the uterus and anterior to the rectum with clear evidence of extraluminal contrast leak, this collection measuring approximately 10.3 x 4.8 cm in axial extent. 2. Small volume free fluid in the low abdomen and pelvis. 3. 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 These are presumably obstructed by abscess cavity in the right hemipelvis. 4. Small left pleural effusion, slightly increased compared to prior examination. 5. Cardiomegaly and coronary artery disease. Aortic Atherosclerosis (ICD10-I70.0). Electronically Signed   By: Delanna Ahmadi M.D.   On: 03/04/2021 08:26   CT ABDOMEN PELVIS W CONTRAST  Result Date: 03/01/2021 CLINICAL DATA:  Abdominal pain, acute, nonlocalized. XI ROBOT  ASSISTED SIGMOID COLECTOMY 2 days ago, continued abdominal pain EXAM: CT ABDOMEN AND PELVIS WITH CONTRAST TECHNIQUE: Multidetector CT imaging of the abdomen and pelvis was performed using the standard protocol following bolus administration of intravenous contrast. RADIATION DOSE REDUCTION: This exam was performed according to the departmental dose-optimization program which includes automated exposure control, adjustment of the mA and/or kV according to patient size and/or use of iterative reconstruction technique. CONTRAST:  134mL OMNIPAQUE IOHEXOL 300 MG/ML  SOLN COMPARISON:  CT abdomen pelvis 11/19/2020, CT abdomen pelvis 12/09/2020 FINDINGS: Lower chest: Trace left pleural effusion. Lingular and left lower lobe atelectasis. Coronary artery calcifications. Hepatobiliary: No focal liver abnormality. No gallstones, gallbladder wall thickening, or pericholecystic fluid. No biliary dilatation. Pancreas: No focal lesion. Normal pancreatic contour. No surrounding inflammatory changes. No main pancreatic ductal dilatation. Spleen: Normal in size without focal abnormality. Adrenals/Urinary Tract: No adrenal nodule bilaterally. Bilateral kidneys enhance symmetrically. Duplicated right collecting system. Interval development of moderate right hydroureteronephrosis. The mid/distal right ureter is not visualized; however, there is likely a normal right ureterovesicular junction (2:86). No left hydroureteronephrosis. No nephroureterolithiasis bilaterally. The urinary bladder is unremarkable. Stomach/Bowel: PO contrast reaches the cecum. Status post sigmoid resection. Colonic anastomotic sutures are noted to be just adjacent to a gas and fluid collection with suggestion of a linear path of gas extending from the lumen of the rectum into the peritoneum (6:75, 2:72). Stomach is within normal limits. No evidence of bowel wall thickening or dilatation. Appendix appears normal. Vascular/Lymphatic: No abdominal aorta or iliac  aneurysm. Severe atherosclerotic plaque of the aorta and its branches. Prominent but nonenlarged retroperitoneal lymph nodes. No abdominal, pelvic, or inguinal lymphadenopathy. Reproductive: Uterus and bilateral adnexa are unremarkable. Other: There is a right inferior anterolateral surgical drain approach with tip terminating within the left anterior pelvis. Trace to small volume free fluid and gas some of which is likely postsurgical in etiology. Interval development of a irregular gas and fluid collection centered in the rectouterine pouch and extending superiorly the level just below the aortic bifurcation. Associated surrounding mild peripheral enhancement. Musculoskeletal: Subcutaneus soft tissue edema and emphysema along the right anterior abdominal wall likely postsurgical. No suspicious lytic or blastic osseous lesions. No acute displaced fracture. Multilevel degenerative changes of the spine. IMPRESSION: 1. Trace left pleural effusion. 2. Findings suggestive of a sigmoid resection anastomotic suture leak/dehiscence with interval development of an abscess centered in the rectouterine pouch and extending superiorly on the right to the level just below the aortic bifurcation. 3. Duplicated right collecting system with interval development of moderate right hydroureteronephrosis. The mid/distal right ureter is not visualized; however, there is likely a normal right ureterovesicular junction. Query obstruction from described gas and fluid collection noted above. 4.  Aortic Atherosclerosis (ICD10-I70.0). These results will be called to the ordering clinician or representative by the Radiologist Assistant, and communication documented in the PACS or [employer]. Electronically Signed   By: Iven Finn M.D.   On: 03/01/2021 23:56   CT IMAGE GUIDED DRAINAGE BY PERCUTANEOUS CATHETER  Result Date: 03/05/2021 INDICATION: Anastomotic leak, pelvic fluid collection EXAM: CT GUIDED DRAINAGE OF pelvic ABSCESS  MEDICATIONS: Per EMR. ANESTHESIA/SEDATION: Moderate (conscious) sedation was employed during this procedure. A total of Versed 1.5 mg and Fentanyl 75 mcg was administered intravenously by the radiology nurse. Total intra-service moderate Sedation Time: 31 minutes. The patient's level of consciousness and vital signs were monitored continuously by radiology nursing throughout the procedure under my direct supervision. COMPLICATIONS: None immediate. TECHNIQUE: Informed written consent was obtained from the patient after a thorough discussion of the procedural risks, benefits and alternatives. All questions were addressed. Maximal Sterile Barrier Technique was utilized including caps, mask, sterile gowns, sterile gloves, sterile drape, hand hygiene and skin antiseptic. A timeout was performed prior to the initiation of the procedure. PROCEDURE: The patient was placed prone on the exam table. Limited CT of the pelvis was performed, again demonstrating a pelvic fluid collection with extraluminal contrast material. A left transgluteal  access was planned. Skin entry site was marked, the overlying skin was prepped and draped in a standard sterile fashion. Local analgesia was obtained with 1% lidocaine. Using intermittent CT fluoroscopy, an 18 gauge trocar needle was advanced towards the pelvic fluid collection. Initial attempts to pass an 035 wire into the collection were unsuccessful, and the trocar was repositioned using CT fluoroscopy. After confirming appropriate location within the collection, an 035 wire was easily advanced. Access site was serially dilated to accommodate a 12 French locking drainage catheter was advanced into the fluid collection. Location was confirmed with return of thick stool-like material, and CT imaging. The drainage catheter was secured to the skin using silk suture and a dressing. It was attached to bulb suction. The patient tolerated the procedure well without immediate complication.  FINDINGS: See above. IMPRESSION: Successful CT-guided placement of a 12 French locking drainage catheter into the pelvic fluid collection via a left transgluteal route. A sample of thick stool-like material was sent to the lab for analysis. Electronically Signed   By: Albin Felling M.D.   On: 03/05/2021 08:15    Assessment and Plan:   Alayah Knouff is a 66 y.o. y/o female has been referred for diverticulitis history at least 3-4 episodes in the past.  She underwent a sigmoid colectomy in February 2023.  Colonoscopy was performed in December 2022.  The GI point of view would recommend a high-fiber diet.  There is no evidence presently to avoid seeds and nuts.  Patient information provided about high-fiber diet Follow up in follow-up as needed  Dr Jonathon Bellows MD,MRCP(U.K)

## 2021-03-18 NOTE — Patient Instructions (Signed)
High-Fiber Eating Plan °Fiber, also called dietary fiber, is a type of carbohydrate. It is found foods such as fruits, vegetables, whole grains, and beans. A high-fiber diet can have many health benefits. Your health care provider may recommend a high-fiber diet to help: °Prevent constipation. Fiber can make your bowel movements more regular. °Lower your cholesterol. °Relieve the following conditions: °Inflammation of veins in the anus (hemorrhoids). °Inflammation of specific areas of the digestive tract (uncomplicated diverticulosis). °A problem of the large intestine, also called the colon, that sometimes causes pain and diarrhea (irritable bowel syndrome, or IBS). °Prevent overeating as part of a weight-loss plan. °Prevent heart disease, type 2 diabetes, and certain cancers. °What are tips for following this plan? °Reading food labels ° °Check the nutrition facts label on food products for the amount of dietary fiber. Choose foods that have 5 grams of fiber or more per serving. °The goals for recommended daily fiber intake include: °Men (age 50 or younger): 34-38 g. °Men (over age 50): 28-34 g. °Women (age 50 or younger): 25-28 g. °Women (over age 50): 22-25 g. °Your daily fiber goal is _____________ g. °Shopping °Choose whole fruits and vegetables instead of processed forms, such as apple juice or applesauce. °Choose a wide variety of high-fiber foods such as avocados, lentils, oats, and kidney beans. °Read the nutrition facts label of the foods you choose. Be aware of foods with added fiber. These foods often have high sugar and sodium amounts per serving. °Cooking °Use whole-grain flour for baking and cooking. °Cook with brown rice instead of white rice. °Meal planning °Start the day with a breakfast that is high in fiber, such as a cereal that contains 5 g of fiber or more per serving. °Eat breads and cereals that are made with whole-grain flour instead of refined flour or white flour. °Eat brown rice, bulgur  wheat, or millet instead of white rice. °Use beans in place of meat in soups, salads, and pasta dishes. °Be sure that half of the grains you eat each day are whole grains. °General information °You can get the recommended daily intake of dietary fiber by: °Eating a variety of fruits, vegetables, grains, nuts, and beans. °Taking a fiber supplement if you are not able to take in enough fiber in your diet. It is better to get fiber through food than from a supplement. °Gradually increase how much fiber you consume. If you increase your intake of dietary fiber too quickly, you may have bloating, cramping, or gas. °Drink plenty of water to help you digest fiber. °Choose high-fiber snacks, such as berries, raw vegetables, nuts, and popcorn. °What foods should I eat? °Fruits °Berries. Pears. Apples. Oranges. Avocado. Prunes and raisins. Dried figs. °Vegetables °Sweet potatoes. Spinach. Kale. Artichokes. Cabbage. Broccoli. Cauliflower. Green peas. Carrots. Squash. °Grains °Whole-grain breads. Multigrain cereal. Oats and oatmeal. Brown rice. Barley. Bulgur wheat. Millet. Quinoa. Bran muffins. Popcorn. Rye wafer crackers. °Meats and other proteins °Navy beans, kidney beans, and pinto beans. Soybeans. Split peas. Lentils. Nuts and seeds. °Dairy °Fiber-fortified yogurt. °Beverages °Fiber-fortified soy milk. Fiber-fortified orange juice. °Other foods °Fiber bars. °The items listed above may not be a complete list of recommended foods and beverages. Contact a dietitian for more information. °What foods should I avoid? °Fruits °Fruit juice. Cooked, strained fruit. °Vegetables °Fried potatoes. Canned vegetables. Well-cooked vegetables. °Grains °White bread. Pasta made with refined flour. White rice. °Meats and other proteins °Fatty cuts of meat. Fried chicken or fried fish. °Dairy °Milk. Yogurt. Cream cheese. Sour cream. °Fats and   oils °Butters. °Beverages °Soft drinks. °Other foods °Cakes and pastries. °The items listed above may  not be a complete list of foods and beverages to avoid. Talk with your dietitian about what choices are best for you. °Summary °Fiber is a type of carbohydrate. It is found in foods such as fruits, vegetables, whole grains, and beans. °A high-fiber diet has many benefits. It can help to prevent constipation, lower blood cholesterol, aid weight loss, and reduce your risk of heart disease, diabetes, and certain cancers. °Increase your intake of fiber gradually. Increasing fiber too quickly may cause cramping, bloating, and gas. Drink plenty of water while you increase the amount of fiber you consume. °The best sources of fiber include whole fruits and vegetables, whole grains, nuts, seeds, and beans. °This information is not intended to replace advice given to you by your health care provider. Make sure you discuss any questions you have with your health care provider. °Document Revised: 05/24/2019 Document Reviewed: 05/24/2019 °Elsevier Patient Education © 2022 [employer]vier Inc. ° °

## 2021-03-23 ENCOUNTER — Other Ambulatory Visit: Payer: Self-pay

## 2021-03-23 ENCOUNTER — Ambulatory Visit (INDEPENDENT_AMBULATORY_CARE_PROVIDER_SITE_OTHER): Payer: Medicare HMO | Admitting: Surgery

## 2021-03-23 ENCOUNTER — Encounter: Payer: Self-pay | Admitting: Surgery

## 2021-03-23 ENCOUNTER — Telehealth: Payer: Self-pay

## 2021-03-23 VITALS — BP 108/75 | HR 77 | Temp 98.4°F | Resp 18 | Ht 68.0 in | Wt 232.0 lb

## 2021-03-23 DIAGNOSIS — Z9049 Acquired absence of other specified parts of digestive tract: Secondary | ICD-10-CM

## 2021-03-23 DIAGNOSIS — R1084 Generalized abdominal pain: Secondary | ICD-10-CM

## 2021-03-23 MED ORDER — IBUPROFEN 800 MG PO TABS: 800.0000 mg | ORAL_TABLET | Freq: Three times a day (TID) | ORAL | 0 refills | Status: DC | PRN

## 2021-03-23 NOTE — Progress Notes (Signed)
Stacy Cox is a 66 year old status post robotic low anterior resection on 02/26/2021. She developed an anastomotic leak was able to manage with percutaneous drain. Currently has 2 drains that are pouring some purulent output about 5 to 10 cc a day. He is tolerating diet.  No fevers no chills. Completed antibiotic therapy She is taking p.o. and having bowel movements.  PE NAD aBD soft some induration in the right lower quadrant were port incision was.  There is no evidence of infection.  There is a subcutaneous induration from the port site.  No obvious infection no abscess.  No peritonitis Drains were cleaned and flushed.  She did experience some pain on the transgluteal drain.  A/p CONTAINED anastomotic leak.  We will rescan her to assess for any other collections and drain placement.  I will see her back next week

## 2021-03-23 NOTE — Patient Instructions (Addendum)
CT Abdomen to be scheduled. We will call you with an appointment.   F/U in office after imaging next week.

## 2021-03-23 NOTE — Telephone Encounter (Signed)
Called and spoke with the patient about her CT scan. The patient is scheduled for a ct scan of the abdomen and pelvis at [facility] on 03/26/21 at 8:30 am. She needs to arrive there by 8:00 am and have nothing to eat for 4 hours prior. She will need to pick up a prep kit from them. The scan center is located at [address], [place], [place]. She is aware to call with any questions.

## 2021-03-26 ENCOUNTER — Telehealth: Payer: Self-pay | Admitting: Surgery

## 2021-03-26 ENCOUNTER — Ambulatory Visit
Admission: RE | Admit: 2021-03-26 | Discharge: 2021-03-26 | Disposition: A | Discharge status: Home / Self Care | Payer: Medicare HMO | Source: Ambulatory Visit | Attending: Surgery | Admitting: Surgery

## 2021-03-26 ENCOUNTER — Other Ambulatory Visit: Payer: Self-pay | Admitting: Surgery

## 2021-03-26 ENCOUNTER — Other Ambulatory Visit: Payer: Self-pay

## 2021-03-26 ENCOUNTER — Telehealth: Payer: Self-pay

## 2021-03-26 DIAGNOSIS — T8143XA Infection following a procedure, organ and space surgical site, initial encounter: Secondary | ICD-10-CM

## 2021-03-26 DIAGNOSIS — R1084 Generalized abdominal pain: Secondary | ICD-10-CM

## 2021-03-26 MED ORDER — AMOXICILLIN-POT CLAVULANATE 875-125 MG PO TABS: 1.0000 | ORAL_TABLET | Freq: Two times a day (BID) | ORAL | 0 refills | Status: DC

## 2021-03-26 MED ORDER — CIPROFLOXACIN HCL 500 MG PO TABS: 500.0000 mg | ORAL_TABLET | Freq: Two times a day (BID) | ORAL | 0 refills | Status: DC

## 2021-03-26 MED ORDER — METRONIDAZOLE 500 MG PO TABS: 500.0000 mg | ORAL_TABLET | Freq: Three times a day (TID) | ORAL | 0 refills | Status: DC

## 2021-03-26 MED ORDER — IOPAMIDOL (ISOVUE-300) INJECTION 61%
100.0000 mL | Freq: Once | INTRAVENOUS | Status: AC | PRN
  Administered 2021-03-26: 100 mL via INTRAVENOUS

## 2021-03-26 NOTE — Telephone Encounter (Signed)
CT reviewed and d/w Dr. Laurence Ferrari. Overall things improving. New collection Right flank need to be aspirated and may need drain. We will arrange for U/S aspiration /drain placement. We will call her additional 10 days of A/Bs cipro and flagyl. She is otherwise doing well. Prior pelvic drain functioning and no need to reposition

## 2021-03-26 NOTE — Telephone Encounter (Signed)
Spoke with patient-Augmentin has been sent to pharmacy and can be picked up.

## 2021-03-26 NOTE — Progress Notes (Signed)
Patient placed today on schedule for abscess drain placement 03/30/2021, called and spoke with patient on phone with pre procedure instructions given. Made aware to be here @ 0900, NPO after MN prior to procedure, and driver post procedure/recovery/discharge. Stated understanding. Will hold am dose of metformin.

## 2021-03-26 NOTE — Telephone Encounter (Signed)
Received call from [facility]-see CT scan results. Message routed to Dr.[name] also called and spoke with Dr.[name]-medication sent to [facility] with [place] to call me back with appointment for drain placement.

## 2021-03-26 NOTE — Telephone Encounter (Signed)
Left message for patient to call office regarding CT results and medication sent to pharmacy-also I am waiting to set with appointment for US Guided drain placement with IR.

## 2021-03-26 NOTE — Telephone Encounter (Signed)
Mark with the [facility] has a question regarding the Cipro that was prescribed, concerned that it may interact with one of her heart medications.  Wants to know if there might be an alternative drug instead.  Please call him at [phone]

## 2021-03-26 NOTE — Telephone Encounter (Signed)
Spoke with patient-D[facility] placement scheduled 03/30/21 @ 9:00 am [facility].  Also patient was instructed to pick up medication at the pharmacy later today and to keep scheduled follow up appointment with Dr.[name] next week.

## 2021-03-27 ENCOUNTER — Other Ambulatory Visit: Payer: Self-pay | Admitting: Student

## 2021-03-30 ENCOUNTER — Other Ambulatory Visit: Payer: Self-pay | Admitting: Diagnostic Radiology

## 2021-03-30 ENCOUNTER — Other Ambulatory Visit: Payer: Self-pay

## 2021-03-30 ENCOUNTER — Other Ambulatory Visit: Payer: Self-pay | Admitting: Surgery

## 2021-03-30 ENCOUNTER — Encounter: Payer: Self-pay | Admitting: Radiology

## 2021-03-30 ENCOUNTER — Ambulatory Visit
Admission: RE | Admit: 2021-03-30 | Discharge: 2021-03-30 | Disposition: A | Discharge status: Home / Self Care | Payer: Medicare HMO | Source: Ambulatory Visit | Attending: Surgery | Admitting: Surgery

## 2021-03-30 ENCOUNTER — Ambulatory Visit
Admission: RE | Admit: 2021-03-30 | Discharge: 2021-03-30 | Disposition: A | Discharge status: Home / Self Care | Payer: Medicare HMO | Source: Ambulatory Visit | Attending: Diagnostic Radiology | Admitting: Diagnostic Radiology

## 2021-03-30 DIAGNOSIS — T8143XA Infection following a procedure, organ and space surgical site, initial encounter: Secondary | ICD-10-CM

## 2021-03-30 DIAGNOSIS — J449 Chronic obstructive pulmonary disease, unspecified: Secondary | ICD-10-CM | POA: Diagnosis not present

## 2021-03-30 DIAGNOSIS — I509 Heart failure, unspecified: Secondary | ICD-10-CM | POA: Diagnosis not present

## 2021-03-30 DIAGNOSIS — K651 Peritoneal abscess: Secondary | ICD-10-CM

## 2021-03-30 DIAGNOSIS — Z7984 Long term (current) use of oral hypoglycemic drugs: Secondary | ICD-10-CM | POA: Insufficient documentation

## 2021-03-30 DIAGNOSIS — F17210 Nicotine dependence, cigarettes, uncomplicated: Secondary | ICD-10-CM | POA: Insufficient documentation

## 2021-03-30 DIAGNOSIS — E119 Type 2 diabetes mellitus without complications: Secondary | ICD-10-CM | POA: Insufficient documentation

## 2021-03-30 DIAGNOSIS — I110 Hypertensive heart disease with heart failure: Secondary | ICD-10-CM | POA: Diagnosis not present

## 2021-03-30 HISTORY — PX: IR IMAGE GUIDED FLUID DRAIN BY CATHETER: IMG5463

## 2021-03-30 HISTORY — PX: IR CATHETER TUBE CHANGE: IMG717

## 2021-03-30 LAB — GLUCOSE, CAPILLARY: Glucose-Capillary: 93 mg/dL (ref 70–99)

## 2021-03-30 MED ORDER — LIDOCAINE HCL 1 % IJ SOLN
INTRAMUSCULAR | Status: AC
  Administered 2021-03-30: 7 mL
  Filled 2021-03-30: qty 20

## 2021-03-30 MED ORDER — IOHEXOL 350 MG/ML SOLN
10.0000 mL | Freq: Once | INTRAVENOUS | Status: AC | PRN
  Administered 2021-03-30: 10 mL
  Filled 2021-03-30: qty 10

## 2021-03-30 MED ORDER — MIDAZOLAM HCL 5 MG/5ML IJ SOLN
INTRAMUSCULAR | Status: AC | PRN
  Administered 2021-03-30: 1 mg via INTRAVENOUS

## 2021-03-30 MED ORDER — FENTANYL CITRATE (PF) 100 MCG/2ML IJ SOLN
INTRAMUSCULAR | Status: AC | PRN
  Administered 2021-03-30: 50 ug via INTRAVENOUS
  Administered 2021-03-30: 25 ug via INTRAVENOUS

## 2021-03-30 MED ORDER — MIDAZOLAM HCL 2 MG/2ML IJ SOLN
INTRAMUSCULAR | Status: AC | PRN
  Administered 2021-03-30: 1 mg via INTRAVENOUS

## 2021-03-30 MED ORDER — FENTANYL CITRATE PF 50 MCG/ML IJ SOSY
25.0000 ug | PREFILLED_SYRINGE | Freq: Once | INTRAMUSCULAR | Status: AC
  Administered 2021-03-30: 25 ug via INTRAVENOUS

## 2021-03-30 MED ORDER — FENTANYL CITRATE (PF) 100 MCG/2ML IJ SOLN
INTRAMUSCULAR | Status: AC
  Filled 2021-03-30: qty 2

## 2021-03-30 MED ORDER — MIDAZOLAM HCL 2 MG/2ML IJ SOLN
INTRAMUSCULAR | Status: AC
  Filled 2021-03-30: qty 2

## 2021-03-30 MED ORDER — LIDOCAINE HCL 1 % IJ SOLN
INTRAMUSCULAR | Status: AC
  Administered 2021-03-30: 5 mL
  Filled 2021-03-30: qty 20

## 2021-03-30 MED ORDER — SODIUM CHLORIDE 0.9 % IV SOLN: INTRAVENOUS | Status: DC

## 2021-03-30 NOTE — H&P (Signed)
Chief Complaint: Patient was seen in consultation today for intra-abdominal abscess at the request of Stacy Cox  Referring Physician(s): Stacy Cox Cox  Supervising Physician: [name]  Patient Status: [facility] - Out-pt  History of Present Illness: Stacy Cox is a 66 y.o. female with PMHx of complicated diverticulitis s/p robotic assisted Lap sigmoid colectomy 1/26 with post operative anastomotic leak and abscess s/p successful IR 42F left sided transgluteal drain placed 03/05/21. The patient has had surgical follow up with a CT done 2/23 revealed worsening right sided fluid collection and questionable retraction of left sided drain, request received for new drain placement. The patient denies any current chest pain or shortness of breath. She does admit to right sided abdominal pain rated 7/10. She states she has been flushing her drain as instructed and minimal 5cc output daily. She has no known complications to sedation or iodinated contrast.    Past Medical History:  Diagnosis Date   CHF (congestive heart failure) (HCC)    COPD (chronic obstructive pulmonary disease) (HCC)    Diabetes mellitus, type 2 (HCC)    GERD (gastroesophageal reflux disease)    Heart disease    Hypertension    Joint pain    Polycythemia vera ([place])    Patient Denies   Sleep apnea    Tobacco abuse     Past Surgical History:  Procedure Laterality Date   COLONOSCOPY  2017   COLONOSCOPY WITH PROPOFOL N/A 01/07/2021   Procedure: COLONOSCOPY WITH PROPOFOL;  Surgeon: Stacy Manifold, MD;  Location: [facility] ENDOSCOPY;  Service: Endoscopy;  Laterality: N/A;   IR CATHETER TUBE CHANGE  11/25/2020   IR CATHETER TUBE CHANGE  11/25/2020   IR RADIOLOGIST EVAL & MGMT  11/19/2020   IR RADIOLOGIST EVAL & MGMT  12/02/2020   IR RADIOLOGIST EVAL & MGMT  12/09/2020    Allergies: Patient has no known allergies.  Medications: Prior to Admission medications   Medication Sig Start Date End Date Taking?  Authorizing Provider  amiodarone (PACERONE) 200 MG tablet Take 1 tablet (200 mg total) by mouth daily. 11/08/20  Yes [name], MD  amoxicillin-clavulanate (AUGMENTIN) 875-125 MG tablet Take 1 tablet by mouth 2 (two) times daily for 14 days. 03/26/21 04/09/21 Yes [name], [name], MD  apixaban (ELIQUIS) 5 MG TABS tablet Take 1 tablet (5 mg total) by mouth 2 (two) times daily. 11/07/20  Yes [name], MD  Ascorbic Acid (VITAMIN C) 1000 MG tablet Take 1,000 mg by mouth daily.   Yes [provider]  carvedilol (COREG CR) 20 MG 24 hr capsule Take 20 mg by mouth daily.   Yes [provider]  Cholecalciferol (VITAMIN D) 50 MCG (2000 UT) CAPS Take 2,000 Units by mouth daily.   Yes [provider]  diclofenac Sodium (VOLTAREN) 1 % GEL Apply 2 g topically daily as needed (joint pain). 12/29/20  Yes [provider]  furosemide (LASIX) 40 MG tablet Take 40 mg by mouth daily. 11/19/14  Yes [provider]  gabapentin (NEURONTIN) 100 MG capsule Take 100-200 mg by mouth 3 (three) times daily as needed (pain). 10/20/20  Yes [provider]  ibuprofen (ADVIL) 800 MG tablet Take 1 tablet (800 mg total) by mouth every 8 (eight) hours as needed for moderate pain. 03/23/21  Yes [name], [name], MD  oxyCODONE (OXY IR/ROXICODONE) 5 MG immediate release tablet Take 1 tablet (5 mg total) by mouth every 4 (four) hours as needed for severe pain or breakthrough pain. 03/09/21  Yes  Stacy Fantasia, [name]  pantoprazole (PROTONIX) 40 MG tablet Take 40 mg by mouth daily. 11/19/14  Yes [provider]  potassium chloride SA (K-DUR,KLOR-CON) 20 MEQ tablet Take 20 mEq by mouth daily. 11/20/14  Yes [provider]  pregabalin (LYRICA) 100 MG capsule Take 1 capsule (100 mg total) by mouth 3 (three) times daily for 14 days. 03/09/21 03/30/21 Yes Stacy Fantasia, [name]  spironolactone (ALDACTONE) 25 MG tablet Take 1 tablet by mouth daily. 11/19/14  Yes [provider]  vitamin B-12 (CYANOCOBALAMIN) 1000 MCG tablet Take 1,000 mcg by mouth daily.   Yes [provider]  zinc gluconate 50 MG tablet Take 50 mg by mouth daily.   Yes [provider]  loperamide (IMODIUM) 2 MG capsule Take 1 capsule (2 mg total) by mouth 2 (two) times daily. Patient not taking: Reported on 03/23/2021 03/09/21   Stacy Fantasia, [name]  metFORMIN (GLUCOPHAGE) 500 MG tablet Take 500 mg by mouth daily with breakfast. 11/19/14   [provider]  Multiple Vitamins-Minerals (MULTIVITAL PO) Take 1 tablet by mouth daily.    [provider]  nicotine (NICODERM CQ) 21 mg/24hr patch Place 1 patch (21 mg total) onto the skin daily. Patient not taking: Reported on 03/23/2021 01/19/21   Stacy Cox F, MD  sodium chloride flush (NS) 0.9 % SOLN Inject 5 mLs into the vein daily. Flush left gluteal drain daily with 5 ccs of NS 03/09/21 05/08/21  Stacy Fantasia, [name]     Family History  Problem Relation Age of Onset   Breast cancer Sister        sister #1 age 74   Kidney cancer Sister        sister #1 age -25's   Breast cancer Sister        sister #2 age 93's   Kidney disease Sister        sister #2 age 26's   Diabetes Mother    Heart disease Mother        + BYPASS SURGERY   Deep vein thrombosis Mother    Arthritis Mother    Diabetes Sister    Heart disease Sister    Hypertension Brother    Heart attack Father     Social History   Socioeconomic History   Marital status: Divorced    Spouse name: Not on file   Number of children: 4   Years of education: Not on file   Highest education level: Not on file  Occupational History   Not on file  Tobacco Use   Smoking status: Every Day    Packs/day: 1.00    Years: 40.00    Pack years: 40.00    Types: Cigarettes   Smokeless tobacco: Former    Types: Snuff   Tobacco comments:    smoking since teens  Vaping Use   Vaping Use: Former  Substance and Sexual Activity   Alcohol use: No     Alcohol/week: 0.0 standard drinks   Drug use: No   Sexual activity: Not Currently  Other Topics Concern   Not on file  Social History Narrative   Lives with daughter   Social Determinants of Health   Financial Resource Strain: Not on file  Food Insecurity: Not on file  Transportation Needs: Not on file  Physical Activity: Not on file  Stress: Not on file  Social Connections: Not on file    Review of Systems: A 12 point ROS discussed and pertinent positives  are indicated in the HPI above.  All other systems are negative.  Review of Systems  Vital Signs: BP 127/86    Pulse 80    Temp 97.8 Cox (36.6 C) (Oral)    Resp (!) 22    Ht 5\' 8"  (1.727 m)    Wt 231 lb 7.7 oz (105 kg)    SpO2 95%    BMI 35.20 kg/m   Physical Exam Constitutional:      Appearance: Normal appearance.  HENT:     Head: Normocephalic and atraumatic.  Cardiovascular:     Rate and Rhythm: Normal rate and regular rhythm.  Pulmonary:     Effort: Pulmonary effort is normal.     Breath sounds: Normal breath sounds.  Abdominal:     Palpations: Abdomen is soft.     Tenderness: There is abdominal tenderness.     Comments: Left sided drain intact small amount of output   Neurological:     Mental Status: She is alert and oriented to person, place, and time.    Imaging: CT Abdomen Pelvis W Contrast  Result Date: 03/26/2021 CLINICAL DATA:  A 66 year old female presents with postoperative abdominal pain with pain at drain site. EXAM: CT ABDOMEN AND PELVIS WITH CONTRAST TECHNIQUE: Multidetector CT imaging of the abdomen and pelvis was performed using the standard protocol following bolus administration of intravenous contrast. RADIATION DOSE REDUCTION: This exam was performed according to the departmental dose-optimization program which includes automated exposure control, adjustment of the mA and/or kV according to patient size and/or use of iterative reconstruction technique. CONTRAST:  182mL ISOVUE-300 IOPAMIDOL  (ISOVUE-300) INJECTION 61% COMPARISON:  March 04, 2021. FINDINGS: Lower chest: Mild basilar atelectasis. Resolution of LEFT-sided pleural fluid. Mild pleural thickening along the RIGHT lateral chest likely related to scarring not changed. Hepatobiliary: Liver with mildly nodular contours. No biliary duct distension. No pericholecystic stranding. Portal vein is patent. Hepatic steatosis. Vague area of hypodensity persists at the site of previous hepatic abscess. No focal abscess. Pancreas: Normal, without mass, inflammation or ductal dilatation. Spleen: Normal. 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 RIGHT adrenal is normal. 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 No frank hydronephrosis on the RIGHT and symmetric enhancement of the bilateral kidneys is noted. Urinary bladder is collapsed. Stomach/Bowel: Stomach without signs of acute process. Small bowel without dilation or signs of obstruction. Small bowel loops are in close proximity to a collapsed cavity within the pelvis at the site of previous anastomotic leak and fluid collection. See below. Appendix is normal. Stool in the colon mixed with contrast material. Pigtail drainage catheter remains in place at the site of the anastomotic leak passing through LEFT gluteal musculature into the pelvis. Side-hole marker in the perirectal fat, catheter still looped within the inferior aspect of the collection. Gas still tracks along soft tissue planes but is reduced. This extends to the RIGHT upper pelvic sidewall adjacent to iliac vessels and to a lesser extent along the LEFT pelvis. Greatest AP dimension 2.1 cm, when measured in a similar area this measured 4 cm. The distribution of gas is  similar but the "collection" is now largely collapsed. There is a large amount of formed stool below the anastomotic level in the rectum. Vascular/Lymphatic: Aortic atherosclerosis. No sign of aneurysm. Smooth contour  of the IVC. There is no gastrohepatic or hepatoduodenal ligament lymphadenopathy. No retroperitoneal or mesenteric lymphadenopathy. No pelvic sidewall lymphadenopathy. Atherosclerotic changes are moderate with calcified and noncalcified plaque in the abdominal aorta. Asymmetry in terms of density of the femoral vein on the RIGHT as compared to the LEFT without surrounding stranding. Focal mild caliber change just peripheral to deep femoral venous take-off (image 69/5) Reproductive: Unremarkable by CT aside from local fluid in gas which tracks behind the uterus in adjacent to the adnexa. Other: Enlarging fluid collection in the RIGHT lower quadrant in the body wall measuring 10.1 x 5.3 cm as compared to 7.3 x 1.8 cm, appearing more organized and seen just inferior to the drain insertion site along oblique musculature and rectus tracking into the pannus. LEFT flank collection 4.7 x 1.6 cm better organized than on previous imaging as well but with overall reduction collections along the LEFT flank, largest collection in this general area on the prior study measured up to 6.6 cm but this was slightly superior to the current collection and posterior to the current collection. Interloop abscess on image 46/2) 3.5 x 2.2 cm previously approximately 5.6 x 2.8 cm appearing better organized as well on the current exam when compared to prior studies. Adjacent approximately 1-1.2 cm interloop abscess best seen on coronal image 47/5) in the RIGHT hemiabdomen Third small interloop abscess (image 72/5) 17 mm in greatest dimension when measured in the coronal plane. Musculoskeletal: No acute or significant osseous findings. IMPRESSION: 1. Decompression of the cavity associated with the fluid and gas collection still  with a small amount of fluid and gas adjacent to the anastomotic site following placement of a pigtail drain. 2. Marker for sideholes of the pigtail drainage catheter is outside of the collection. Correlate with catheter function and consider repositioning as warranted. 3. Large volume of stool below the anastomosis. Correlate with signs of constipation or fecal impaction. 4. Enlarging collection along the RIGHT body wall with surrounding stranding appearing more organized may represent abscess in tracks into the patient's pannus along body wall musculature in the subcutaneous fat. 5. Increasingly organized appearance of scattered interloop and LEFT flank abscesses as described. Overall amount of fluid in the abdomen has diminished considerably however since previous imaging. 6. Asymmetry of RIGHT femoral vein with caliber change in slightly irregular appearance near the takeoff of the deep femoral vein more likely reflects mixing artifact. However, would consider sonographic assessment to exclude developing DVT in this location given asymmetric appearance and differential density with focal venous caliber change. Aortic Atherosclerosis (ICD10-I70.0). These results will be called to the ordering clinician or representative by the Radiologist Assistant, and communication documented in the PACS or [employer]. Electronically Signed   By: Zetta Bills M.D.   On: 03/26/2021 09:29   CT ABDOMEN PELVIS W CONTRAST  Result Date: 03/04/2021 CLINICAL DATA:  Postoperative abdominal pain, status post laparoscopic low anterior resection EXAM: CT ABDOMEN AND PELVIS WITH CONTRAST TECHNIQUE: Multidetector CT imaging of the abdomen and pelvis was performed using the standard protocol following bolus administration of intravenous contrast. RADIATION DOSE REDUCTION: This exam was performed according to the departmental dose-optimization program which includes automated exposure control, adjustment of the mA and/or kV  according to patient size and/or use of iterative reconstruction technique. CONTRAST:  160mL OMNIPAQUE IOHEXOL 300 MG/ML SOLN, additional oral enteric contrast COMPARISON:  03/01/2021 FINDINGS: Lower chest: Small left pleural effusion, slightly increased compared to prior examination. Cardiomegaly. Three-vessel coronary artery calcifications Hepatobiliary: No solid liver abnormality  is seen. No gallstones, gallbladder wall thickening, or biliary dilatation. Pancreas: Unremarkable. No pancreatic ductal dilatation or surrounding inflammatory changes. Spleen: Normal in size without significant abnormality. Adrenals/Urinary Tract: Adrenal glands are unremarkable. 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 These are presumably obstructed by abscess cavity in the right hemipelvis. Bladder is unremarkable. Stomach/Bowel: Stomach is within normal limits. Appendix appears normal. Status post low anterior resection and reanastomosis. There is an unchanged air and fluid collection about the anastomotic suture line in the low pelvis posterior to the uterus and anterior to the rectum with clear evidence of extraluminal contrast leak, this collection measuring approximately 10.3 x 4.8 cm (series 2, image 76). Vascular/Lymphatic: Aortic atherosclerosis. No enlarged abdominal or pelvic lymph nodes. Reproductive: No mass or other significant abnormality. Other: Anasarca. Unchanged subcutaneous emphysema about the right hemiabdomen (series 2, image 36). Small volume free fluid in the low abdomen and pelvis. Right ventral approach surgical drain is positioned about the low abdomen, tip in the left lower quadrant. Musculoskeletal: No acute or significant osseous findings. IMPRESSION: 1. Status post low anterior resection and reanastomosis. There is an unchanged air and fluid collection about the anastomotic suture line in the low  pelvis posterior to the uterus and anterior to the rectum with clear evidence of extraluminal contrast leak, this collection measuring approximately 10.3 x 4.8 cm in axial extent. 2. Small volume free fluid in the low abdomen and pelvis. 3. 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 These are presumably obstructed by abscess cavity in the right hemipelvis. 4. Small left pleural effusion, slightly increased compared to prior examination. 5. Cardiomegaly and coronary artery disease. Aortic Atherosclerosis (ICD10-I70.0). Electronically Signed   By: Delanna Ahmadi M.D.   On: 03/04/2021 08:26   CT ABDOMEN PELVIS W CONTRAST  Result Date: 03/01/2021 CLINICAL DATA:  Abdominal pain, acute, nonlocalized. XI ROBOT ASSISTED SIGMOID COLECTOMY 2 days ago, continued abdominal pain EXAM: CT ABDOMEN AND PELVIS WITH CONTRAST TECHNIQUE: Multidetector CT imaging of the abdomen and pelvis was performed using the standard protocol following bolus administration of intravenous contrast. RADIATION DOSE REDUCTION: This exam was performed according to the departmental dose-optimization program which includes automated exposure control, adjustment of the mA and/or kV according to patient size and/or use of iterative reconstruction technique. CONTRAST:  141mL OMNIPAQUE IOHEXOL 300 MG/ML  SOLN COMPARISON:  CT abdomen pelvis 11/19/2020, CT abdomen pelvis 12/09/2020 FINDINGS: Lower chest: Trace left pleural effusion. Lingular and left lower lobe atelectasis. Coronary artery calcifications. Hepatobiliary: No focal liver abnormality. No gallstones, gallbladder wall thickening, or pericholecystic fluid. No biliary dilatation. Pancreas: No focal lesion. Normal pancreatic contour. No surrounding inflammatory changes. No main pancreatic ductal dilatation. Spleen: Normal in size without focal abnormality. Adrenals/Urinary Tract: No adrenal nodule  bilaterally. Bilateral kidneys enhance symmetrically. Duplicated right collecting system. Interval development of moderate right hydroureteronephrosis. The mid/distal right ureter is not visualized; however, there is likely a normal right ureterovesicular junction (2:86). No left hydroureteronephrosis. No nephroureterolithiasis bilaterally. The urinary bladder is unremarkable. Stomach/Bowel: PO contrast reaches the cecum. Status post sigmoid resection. Colonic anastomotic sutures are noted to be just adjacent to a gas and fluid collection with suggestion of a linear path of gas extending from the lumen of the rectum into the peritoneum (6:75, 2:72). Stomach is within normal limits. No evidence of bowel wall thickening or dilatation. Appendix appears normal. Vascular/Lymphatic: No abdominal aorta or iliac  aneurysm. Severe atherosclerotic plaque of the aorta and its branches. Prominent but nonenlarged retroperitoneal lymph nodes. No abdominal, pelvic, or inguinal lymphadenopathy. Reproductive: Uterus and bilateral adnexa are unremarkable. Other: There is a right inferior anterolateral surgical drain approach with tip terminating within the left anterior pelvis. Trace to small volume free fluid and gas some of which is likely postsurgical in etiology. Interval development of a irregular gas and fluid collection centered in the rectouterine pouch and extending superiorly the level just below the aortic bifurcation. Associated surrounding mild peripheral enhancement. Musculoskeletal: Subcutaneus soft tissue edema and emphysema along the right anterior abdominal wall likely postsurgical. No suspicious lytic or blastic osseous lesions. No acute displaced fracture. Multilevel degenerative changes of the spine. IMPRESSION: 1. Trace left pleural effusion. 2. Findings suggestive of a sigmoid resection anastomotic suture leak/dehiscence with interval development of an abscess centered in the rectouterine pouch and extending  superiorly on the right to the level just below the aortic bifurcation. 3. Duplicated right collecting system with interval development of moderate right hydroureteronephrosis. The mid/distal right ureter is not visualized; however, there is likely a normal right ureterovesicular junction. Query obstruction from described gas and fluid collection noted above. 4.  Aortic Atherosclerosis (ICD10-I70.0). These results will be called to the ordering clinician or representative by the Radiologist Assistant, and communication documented in the PACS or [employer]. Electronically Signed   By: Iven Finn M.D.   On: 03/01/2021 23:56   CT IMAGE GUIDED DRAINAGE BY PERCUTANEOUS CATHETER  Result Date: 03/05/2021 INDICATION: Anastomotic leak, pelvic fluid collection EXAM: CT GUIDED DRAINAGE OF pelvic ABSCESS MEDICATIONS: Per EMR. ANESTHESIA/SEDATION: Moderate (conscious) sedation was employed during this procedure. A total of Versed 1.5 mg and Fentanyl 75 mcg was administered intravenously by the radiology nurse. Total intra-service moderate Sedation Time: 31 minutes. The patient's level of consciousness and vital signs were monitored continuously by radiology nursing throughout the procedure under my direct supervision. COMPLICATIONS: None immediate. TECHNIQUE: Informed written consent was obtained from the patient after a thorough discussion of the procedural risks, benefits and alternatives. All questions were addressed. Maximal Sterile Barrier Technique was utilized including caps, mask, sterile gowns, sterile gloves, sterile drape, hand hygiene and skin antiseptic. A timeout was performed prior to the initiation of the procedure. PROCEDURE: The patient was placed prone on the exam table. Limited CT of the pelvis was performed, again demonstrating a pelvic fluid collection with extraluminal contrast material. A left transgluteal access was planned. Skin entry site was marked, the overlying skin was prepped and  draped in a standard sterile fashion. Local analgesia was obtained with 1% lidocaine. Using intermittent CT fluoroscopy, an 18 gauge trocar needle was advanced towards the pelvic fluid collection. Initial attempts to pass an 035 wire into the collection were unsuccessful, and the trocar was repositioned using CT fluoroscopy. After confirming appropriate location within the collection, an 035 wire was easily advanced. Access site was serially dilated to accommodate a 12 French locking drainage catheter was advanced into the fluid collection. Location was confirmed with return of thick stool-like material, and CT imaging. The drainage catheter was secured to the skin using silk suture and a dressing. It was attached to bulb suction. The patient tolerated the procedure well without immediate complication. FINDINGS: See above. IMPRESSION: Successful CT-guided placement of a 12 French locking drainage catheter into the pelvic fluid collection via a left transgluteal route. A sample of thick stool-like material was sent to the lab for analysis. Electronically Signed   By: Albin Felling  M.D.   On: 03/05/2021 08:15    Labs:  CBC: Recent Labs    03/03/21 0533 03/04/21 0455 03/05/21 0448 03/06/21 0939  WBC 13.2* 12.1* 10.0 13.8*  HGB 10.9* 11.2* 11.8* 10.8*  HCT 31.3* 31.4* 34.3* 31.2*  PLT 230 253 266 294    COAGS: Recent Labs    11/03/20 0619 11/25/20 0940  INR 1.3* 1.1    BMP: Recent Labs    03/05/21 0448 03/06/21 0939 03/07/21 0721 03/09/21 0601  NA 140 139 138 137  K 3.2* 3.6 3.9 4.2  CL 112* 108 108 104  CO2 21* 22 23 26   GLUCOSE 104* 105* 87 116*  BUN 7* 5* <5* 7*  CALCIUM 8.0* 8.2* 8.4* 8.9  CREATININE 0.87 0.82 0.91 0.88  GFRNONAA >60 >60 >60 >60    LIVER FUNCTION TESTS: Recent Labs    11/03/20 0619 11/04/20 0614 03/07/21 0721 03/09/21 0601  BILITOT 3.2* 3.3* 0.7 0.4  AST 59* 66* 17 13*  ALT 65* 61* 14 13  ALKPHOS 131* 106 125 91  PROT 7.1 5.3* 6.0* 6.6  ALBUMIN  2.6* 2.1* 2.4* 2.7*    Assessment and Plan: 66 year old female with PMHx of complicated diverticulitis s/p robotic assisted Lap sigmoid colectomy 1/26 with post operative anastomotic leak and abscess s/p successful IR 89F drain placed 03/05/21. The patient has had surgical follow up with a CT done 2/23 revealed worsening right sided fluid collection and questionable retraction of left sided drain, request received for new drain placement.   The patient has been NPO, Eliquis was taken and Dr. Anselm Pancoast is aware, patient is on Cipro and Flagy at home, imaging, labs and vitals have been reviewed. Will proceed today with existing drain contrast injection to evaluate for fistula and residual collection with possible drain exchange and new placement of right sided abdominal drain.   Risks and benefits discussed with the patient including bleeding, infection, damage to adjacent structures, bowel perforation/fistula connection, and sepsis.  All of the patient's questions were answered, patient is agreeable to proceed. Consent signed and in chart.   Thank you for this interesting consult.  I greatly enjoyed meeting Raymond Azure and look forward to participating in their care.  A copy of this report was sent to the requesting provider on this date.  Electronically Signed: Hedy Jacob, [name] 03/30/2021, 10:19 AM   I spent a total of 5 Minutes in face to face in clinical consultation, greater than 50% of which was counseling/coordinating care for intra-abdominal abscess.

## 2021-03-30 NOTE — Procedures (Signed)
Interventional Radiology Procedure:   Indications: Right abdominal wall abscess.  Evaluate pelvic abscess drain   Procedure: 1)  US guided placement of drain in right abdominal wall abscess / fluid collection 2) Injection and exchange of pelvic drain  Findings: 1) Placed 12 Fr drain in right abdominal fluid collection.  Removed 80 ml of opaque amber colored fluid.   2)  Pelvic drain injection fills an irregular cavity around rectum with contrast filling the rectum.   Tried to reposition pelvic drain in better position for drainage but unable to significantly change position.   Complications: No immediate complications noted.     EBL: Minimal  Plan: 1) Flush new right abdominal drain daily with 5-10 ml of sterile saline 2) Pelvic drain with gravity bag, stop flushing.   Will need f/u CT AP in 1-2 weeks 3) Send new fluid collection fluid for culture.    Makenah Karas R. Anselm Pancoast, MD  Pager: [phone]

## 2021-03-30 NOTE — Progress Notes (Signed)
Patient here today for IR abscess drain placement , stating hadn't taken am pain med prior to coming for procedure/stating 7/10 abdominal pain. Vitals stable. Given Fentanyl 25 mcg IV for pain per orders prior to procedure.

## 2021-03-30 NOTE — Progress Notes (Signed)
Patient clinically stable post Right abdominal abscess drain placement along with left abscess drain exchange. Tolerated well. Vitals stable pre and post procedures. Received Versed 2 mg along with Fentanyl 100 mcg IV for both procedures. Awake/'alert and oriented post procedures.

## 2021-04-01 ENCOUNTER — Encounter: Payer: Self-pay | Admitting: Surgery

## 2021-04-01 ENCOUNTER — Ambulatory Visit (INDEPENDENT_AMBULATORY_CARE_PROVIDER_SITE_OTHER): Payer: Medicare HMO | Admitting: Surgery

## 2021-04-01 ENCOUNTER — Other Ambulatory Visit: Payer: Self-pay

## 2021-04-01 VITALS — BP 111/72 | HR 76 | Temp 97.9°F | Ht 68.0 in | Wt 227.0 lb

## 2021-04-01 DIAGNOSIS — Z09 Encounter for follow-up examination after completed treatment for conditions other than malignant neoplasm: Secondary | ICD-10-CM

## 2021-04-01 DIAGNOSIS — K5732 Diverticulitis of large intestine without perforation or abscess without bleeding: Secondary | ICD-10-CM

## 2021-04-01 MED ORDER — OXYCODONE-ACETAMINOPHEN 5-325 MG PO TABS: 1.0000 | ORAL_TABLET | ORAL | 0 refills | Status: DC | PRN

## 2021-04-01 NOTE — Patient Instructions (Signed)
Please see your follow up appointment listed below.  °

## 2021-04-03 NOTE — Progress Notes (Signed)
Stacy Cox is a 66 year old status post robotic low anterior resection on 02/26/2021. ?She developed an anastomotic leak was able to manage with percutaneous drain. ?Recently new CT shows evidence of a new collection within the right abdominal wall.  She underwent placement of percutaneous drain and this is draining serous fluid with a scant amount. ? ?She has an additional 2 drains w output about 20c c a day. ?SHe is tolerating diet.  No fevers no chills. ? ?She is taking p.o. and having bowel movements. ?  ?PE NAD ?aBD:  soft induration in the right lower quadrant incision has significantly improved. ?Drain with scant amount of serous fluid.  Given the quality and the benign findings on exam I discussed this with patient and we will remove that.  We remove the drain at bedside without any issues.  ?She is not peritonitic. No obvious infection no abscess.  No peritonitis ?Drains were cleaned and flushed.  Her transgluteal drain has feculent output, and the other right abdominal drain has some milky output. ? ? ?A/P contained anastomotic leak being able to be managed medically with drain placement antibiotics.  She now it is turning into a fistula.  She is however very low output with only 20 cc a day.  We will try to manage this conservatively.  If she does not improve we may need to ask advanced endoscopist to potentially perform stent.  She is not quite there yet.  We will see her in a couple weeks.  She is not toxic nor peritonitic and does not need hospitalization or surgical intervention at this time ?

## 2021-04-05 LAB — AEROBIC/ANAEROBIC CULTURE W GRAM STAIN (SURGICAL/DEEP WOUND): Gram Stain: NONE SEEN

## 2021-04-07 ENCOUNTER — Telehealth: Payer: Self-pay

## 2021-04-07 NOTE — Telephone Encounter (Signed)
Patient called and said she is having a lot of pain with her drains. She reports that the pain is not at the suture line but a little to the side. Spoke with Dr Dahlia Byes and he would like her to be seen tomorrow.  ?

## 2021-04-08 ENCOUNTER — Ambulatory Visit (INDEPENDENT_AMBULATORY_CARE_PROVIDER_SITE_OTHER): Payer: Medicare HMO | Admitting: Surgery

## 2021-04-08 ENCOUNTER — Inpatient Hospital Stay: Payer: Medicare HMO

## 2021-04-08 ENCOUNTER — Other Ambulatory Visit: Payer: Self-pay

## 2021-04-08 ENCOUNTER — Encounter: Payer: Self-pay | Admitting: Surgery

## 2021-04-08 ENCOUNTER — Inpatient Hospital Stay
Admission: AD | Admit: 2021-04-08 | Discharge: 2021-04-14 | DRG: 862 | Disposition: A | Discharge status: Home / Self Care | Payer: Medicare HMO | Source: Ambulatory Visit | Attending: Surgery | Admitting: Surgery

## 2021-04-08 VITALS — BP 102/70 | HR 84 | Temp 98.1°F | Ht 68.0 in | Wt 225.2 lb

## 2021-04-08 DIAGNOSIS — F17210 Nicotine dependence, cigarettes, uncomplicated: Secondary | ICD-10-CM | POA: Diagnosis present

## 2021-04-08 DIAGNOSIS — Y838 Other surgical procedures as the cause of abnormal reaction of the patient, or of later complication, without mention of misadventure at the time of the procedure: Secondary | ICD-10-CM | POA: Diagnosis present

## 2021-04-08 DIAGNOSIS — T8143XA Infection following a procedure, organ and space surgical site, initial encounter: Secondary | ICD-10-CM | POA: Diagnosis present

## 2021-04-08 DIAGNOSIS — I10 Essential (primary) hypertension: Secondary | ICD-10-CM | POA: Diagnosis present

## 2021-04-08 DIAGNOSIS — Z9049 Acquired absence of other specified parts of digestive tract: Secondary | ICD-10-CM | POA: Diagnosis not present

## 2021-04-08 DIAGNOSIS — G4730 Sleep apnea, unspecified: Secondary | ICD-10-CM | POA: Diagnosis present

## 2021-04-08 DIAGNOSIS — K651 Peritoneal abscess: Secondary | ICD-10-CM

## 2021-04-08 DIAGNOSIS — E119 Type 2 diabetes mellitus without complications: Secondary | ICD-10-CM | POA: Diagnosis present

## 2021-04-08 DIAGNOSIS — Z8249 Family history of ischemic heart disease and other diseases of the circulatory system: Secondary | ICD-10-CM

## 2021-04-08 DIAGNOSIS — J449 Chronic obstructive pulmonary disease, unspecified: Secondary | ICD-10-CM | POA: Diagnosis present

## 2021-04-08 DIAGNOSIS — R109 Unspecified abdominal pain: Secondary | ICD-10-CM | POA: Diagnosis not present

## 2021-04-08 DIAGNOSIS — K219 Gastro-esophageal reflux disease without esophagitis: Secondary | ICD-10-CM | POA: Diagnosis present

## 2021-04-08 LAB — COMPREHENSIVE METABOLIC PANEL
ALT: 12 U/L (ref 0–44)
AST: 18 U/L (ref 15–41)
Albumin: 3.6 g/dL (ref 3.5–5.0)
Alkaline Phosphatase: 78 U/L (ref 38–126)
Anion gap: 11 (ref 5–15)
BUN: 18 mg/dL (ref 8–23)
CO2: 26 mmol/L (ref 22–32)
Calcium: 9.3 mg/dL (ref 8.9–10.3)
Chloride: 100 mmol/L (ref 98–111)
Creatinine, Ser: 0.89 mg/dL (ref 0.44–1.00)
GFR, Estimated: >60 mL/min (ref >60)
Glucose, Bld: 108 mg/dL — ABNORMAL HIGH (ref 70–99)
Potassium: 3.5 mmol/L (ref 3.5–5.1)
Sodium: 137 mmol/L (ref 135–145)
Total Bilirubin: 0.5 mg/dL (ref 0.3–1.2)
Total Protein: 8.1 g/dL (ref 6.5–8.1)

## 2021-04-08 LAB — RESPIRATORY PANEL BY PCR

## 2021-04-08 LAB — CBC
HCT: 41.5 % (ref 36.0–46.0)
Hemoglobin: 14.1 g/dL (ref 12.0–15.0)
MCH: 30.7 pg (ref 26.0–34.0)
MCHC: 34 g/dL (ref 30.0–36.0)
MCV: 90.2 fL (ref 80.0–100.0)
Platelets: 384 10*3/uL (ref 150–400)
RBC: 4.6 MIL/uL (ref 3.87–5.11)
RDW: 14.7 % (ref 11.5–15.5)
WBC: 14.1 10*3/uL — ABNORMAL HIGH (ref 4.0–10.5)
nRBC: 0 % (ref 0.0–0.2)

## 2021-04-08 MED ORDER — HEPARIN SODIUM (PORCINE) 5000 UNIT/ML IJ SOLN
5000.0000 [IU] | Freq: Three times a day (TID) | INTRAMUSCULAR | Status: DC
  Administered 2021-04-08 – 2021-04-09 (×2): 5000 [IU] via SUBCUTANEOUS
  Filled 2021-04-08 (×2): qty 1

## 2021-04-08 MED ORDER — KETOROLAC TROMETHAMINE 15 MG/ML IJ SOLN: 15.0000 mg | Freq: Four times a day (QID) | INTRAMUSCULAR | Status: DC

## 2021-04-08 MED ORDER — ONDANSETRON HCL 4 MG/2ML IJ SOLN: 4.0000 mg | Freq: Four times a day (QID) | INTRAMUSCULAR | Status: DC | PRN

## 2021-04-08 MED ORDER — DIPHENHYDRAMINE HCL 50 MG/ML IJ SOLN: 12.5000 mg | Freq: Four times a day (QID) | INTRAMUSCULAR | Status: DC | PRN

## 2021-04-08 MED ORDER — ONDANSETRON 4 MG PO TBDP: 4.0000 mg | ORAL_TABLET | Freq: Four times a day (QID) | ORAL | Status: DC | PRN

## 2021-04-08 MED ORDER — ACETAMINOPHEN 500 MG PO TABS
1000.0000 mg | ORAL_TABLET | Freq: Four times a day (QID) | ORAL | Status: DC
  Administered 2021-04-09 – 2021-04-14 (×17): 1000 mg via ORAL
  Filled 2021-04-08 (×21): qty 2

## 2021-04-08 MED ORDER — MORPHINE SULFATE (PF) 4 MG/ML IV SOLN
4.0000 mg | INTRAVENOUS | Status: DC | PRN
  Administered 2021-04-08 – 2021-04-10 (×2): 4 mg via INTRAVENOUS
  Filled 2021-04-08 (×2): qty 1

## 2021-04-08 MED ORDER — PANTOPRAZOLE SODIUM 40 MG IV SOLR
40.0000 mg | Freq: Two times a day (BID) | INTRAVENOUS | Status: DC
  Administered 2021-04-08 – 2021-04-11 (×6): 40 mg via INTRAVENOUS
  Filled 2021-04-08 (×6): qty 10

## 2021-04-08 MED ORDER — METHOCARBAMOL 500 MG PO TABS
500.0000 mg | ORAL_TABLET | Freq: Four times a day (QID) | ORAL | Status: DC | PRN
  Administered 2021-04-12: 500 mg via ORAL
  Filled 2021-04-08: qty 1

## 2021-04-08 MED ORDER — IOHEXOL 300 MG/ML  SOLN
100.0000 mL | Freq: Once | INTRAMUSCULAR | Status: AC | PRN
  Administered 2021-04-08: 100 mL via INTRAVENOUS

## 2021-04-08 MED ORDER — PIPERACILLIN-TAZOBACTAM 3.375 G IVPB
3.3750 g | Freq: Three times a day (TID) | INTRAVENOUS | Status: DC
  Administered 2021-04-09: 05:00:00 3.375 g via INTRAVENOUS
  Filled 2021-04-08 (×2): qty 50

## 2021-04-08 MED ORDER — SODIUM CHLORIDE 0.9 % IV BOLUS
1000.0000 mL | Freq: Once | INTRAVENOUS | Status: AC
  Administered 2021-04-08: 1000 mL via INTRAVENOUS

## 2021-04-08 MED ORDER — KETOROLAC TROMETHAMINE 30 MG/ML IJ SOLN
30.0000 mg | Freq: Four times a day (QID) | INTRAMUSCULAR | Status: DC
  Administered 2021-04-09 – 2021-04-13 (×18): 30 mg via INTRAVENOUS
  Filled 2021-04-08 (×18): qty 1

## 2021-04-08 MED ORDER — PIPERACILLIN-TAZOBACTAM 3.375 G IVPB 30 MIN
3.3750 g | Freq: Once | INTRAVENOUS | Status: AC
  Administered 2021-04-08: 3.375 g via INTRAVENOUS
  Filled 2021-04-08: qty 50

## 2021-04-08 MED ORDER — DIPHENHYDRAMINE HCL 12.5 MG/5ML PO ELIX
12.5000 mg | ORAL_SOLUTION | Freq: Four times a day (QID) | ORAL | Status: DC | PRN
  Filled 2021-04-08: qty 5

## 2021-04-08 MED ORDER — METOPROLOL TARTRATE 5 MG/5ML IV SOLN: 5.0000 mg | Freq: Four times a day (QID) | INTRAVENOUS | Status: DC | PRN

## 2021-04-08 MED ORDER — PREGABALIN 50 MG PO CAPS
50.0000 mg | ORAL_CAPSULE | Freq: Three times a day (TID) | ORAL | Status: DC
  Administered 2021-04-08 – 2021-04-14 (×17): 50 mg via ORAL
  Filled 2021-04-08 (×17): qty 1

## 2021-04-08 MED ORDER — OXYCODONE HCL 5 MG PO TABS
5.0000 mg | ORAL_TABLET | ORAL | Status: DC | PRN
  Administered 2021-04-08 – 2021-04-09 (×5): 10 mg via ORAL
  Administered 2021-04-09: 05:00:00 5 mg via ORAL
  Administered 2021-04-10: 10 mg via ORAL
  Administered 2021-04-10: 5 mg via ORAL
  Administered 2021-04-11: 10 mg via ORAL
  Administered 2021-04-11 – 2021-04-12 (×2): 5 mg via ORAL
  Administered 2021-04-12: 10 mg via ORAL
  Filled 2021-04-08: qty 2
  Filled 2021-04-08 (×2): qty 1
  Filled 2021-04-08 (×6): qty 2
  Filled 2021-04-08 (×2): qty 1
  Filled 2021-04-08: qty 2

## 2021-04-08 MED ORDER — IOHEXOL 9 MG/ML PO SOLN
500.0000 mL | ORAL | Status: AC
  Administered 2021-04-08 (×2): 500 mL via ORAL

## 2021-04-08 MED ORDER — SODIUM CHLORIDE 0.9 % IV SOLN: INTRAVENOUS | Status: DC

## 2021-04-08 NOTE — Patient Instructions (Signed)
Go to [facility], [facility] entrance. Report to the first desk on the right and give them your name.  ? ?

## 2021-04-08 NOTE — Consult Note (Signed)
Pharmacy Antibiotic Note ? ?Stacy Cox is a 66 y.o. female with medical history including complicated diverticulitis s/p sigmoid colectomy on 08/02/75 complicated by anastomotic leak and abscess which was managed with a transgluteal drain placed 03/05/21. New CT on 2/23 with evidence of new collection within right abdominal wall. IR placed percutaneous drain into new fluid collection and exchanged pelvic drain on 2/27. Appears patient was receiving therapy with ciprofloxacin and then Augmentin as outpatient. Patient now admitted on 04/08/2021 with  severe pain on the buttocks .  Pharmacy has been consulted for Zosyn dosing. ? ?Of note: patient has cultured pan-sensitive Pseudomonas aeruginosa on 2/27 from abscess and VRE, Stenotrophomonas maltophilia, and candida tropicalis from pelvis culture on 03/04/21. She also has a history of Streptococcus infection. ? ?Plan: ? ?Zosyn 3.375 g IV x 1 over 30 minutes followed by Zosyn 3.375 g IV q8h (4-hr infusion) ? ? ?Temp (24hrs), Avg:98.1 ?F (36.7 ?C), Min:98.1 ?F (36.7 ?C), Max:98.1 ?F (36.7 ?C) ? ?No results for input(s): WBC, CREATININE, LATICACIDVEN, VANCOTROUGH, VANCOPEAK, VANCORANDOM, GENTTROUGH, GENTPEAK, GENTRANDOM, TOBRATROUGH, TOBRAPEAK, TOBRARND, AMIKACINPEAK, AMIKACINTROU, AMIKACIN in the last 168 hours.  ?CrCl cannot be calculated (Patient's most recent lab result is older than the maximum 21 days allowed.).   ? ?No Known Allergies ? ?Antimicrobials this admission: ?Zosyn 3/8 >>  ? ?Dose adjustments this admission: ?N/A ? ?Microbiology results: ?2/27 Abscess culture: Pan-sensitive PSA ?2/1 Pelvis culture: VRE, Stenotrophomonas maltophilia, Candida tropicalis ? ?Thank you for allowing pharmacy to be a part of this patient?s care. ? ?Benita Gutter ?04/08/2021 3:44 PM ? ?

## 2021-04-08 NOTE — H&P (Signed)
Patient ID: Stacy Cox, female   DOB: [birth year], 66 y.o.   MRN: [id number] ? ?HPI ?Stacy Cox is a 66 y.o. female   6 weeks out from long-term resection from chronic diverticulitis and abscess.  She developed an anastomotic leak that I was able to be managed conservatively with a drain and antibiotic therapy.  She comes in today with worsening left buttock pain close to the drain area.  No fevers no chills.  Decreased energy.  Pain is severe cannot keep up with it.  She has tried oxycodone and ibuprofen without success.  She is having bowel movements. ?Last CT was 12 days ago that I personally review there is evidence of the contained leak and the drain was appropriately positioned.  There was no new areas to indicate that the leak was non contain. ?  ? ?HPI ? ?Past Medical History:  ?Diagnosis Date  ? CHF (congestive heart failure) ([place])   ? COPD (chronic obstructive pulmonary disease) ([place])   ? Diabetes mellitus, type 2 ([place])   ? GERD (gastroesophageal reflux disease)   ? Heart disease   ? Hypertension   ? Joint pain   ? Sleep apnea   ? Tobacco abuse   ? ? ?Past Surgical History:  ?Procedure Laterality Date  ? COLONOSCOPY  2017  ? COLONOSCOPY WITH PROPOFOL N/A 01/07/2021  ? Procedure: COLONOSCOPY WITH PROPOFOL;  Surgeon: [name], MD;  Location: [facility] ENDOSCOPY;  Service: Endoscopy;  Laterality: N/A;  ? IR CATHETER TUBE CHANGE  11/25/2020  ? IR CATHETER TUBE CHANGE  11/25/2020  ? IR CATHETER TUBE CHANGE  03/30/2021  ? IR IMAGE GUIDED FLUID DRAIN BY CATHETER  03/30/2021  ? IR RADIOLOGIST EVAL & MGMT  11/19/2020  ? IR RADIOLOGIST EVAL & MGMT  12/02/2020  ? IR RADIOLOGIST EVAL & MGMT  12/09/2020  ? ? ?Family History  ?Problem Relation Age of Onset  ? Breast cancer Sister   ?     sister #1 age 96  ? Kidney cancer Sister   ?     sister #1 age -41's  ? Breast cancer Sister   ?     sister #2 age 50's  ? Kidney disease Sister   ?     sister #2 age 27's  ? Diabetes Mother   ? Heart disease Mother   ?     +  BYPASS SURGERY  ? Deep vein thrombosis Mother   ? Arthritis Mother   ? Diabetes Sister   ? Heart disease Sister   ? Hypertension Brother   ? Heart attack Father   ? ? ?Social History ?Social History  ? ?Tobacco Use  ? Smoking status: Every Day  ?  Packs/day: 1.00  ?  Years: 40.00  ?  Pack years: 40.00  ?  Types: Cigarettes  ? Smokeless tobacco: Former  ?  Types: Snuff  ? Tobacco comments:  ?  smoking since teens  ?Vaping Use  ? Vaping Use: Former  ?Substance Use Topics  ? Alcohol use: No  ?  Alcohol/week: 0.0 standard drinks  ? Drug use: No  ? ? ?No Known Allergies ? ?Current Facility-Administered Medications  ?Medication Dose Route Frequency Provider Last Rate Last Admin  ? 0.9 %  sodium chloride infusion   Intravenous Continuous Laresha Bacorn F, MD      ? acetaminophen (TYLENOL) tablet 1,000 mg  1,000 mg Oral Q6H Dallon Dacosta F, MD      ? diphenhydrAMINE (BENADRYL) 12.5 MG/5ML elixir 12.5 mg  12.5 mg Oral Q6H PRN Yuto Cajuste F, MD      ? Or  ? diphenhydrAMINE (BENADRYL) injection 12.5 mg  12.5 mg Intravenous Q6H PRN Haniah Penny F, MD      ? heparin injection 5,000 Units  5,000 Units Subcutaneous Q8H Ayssa Bentivegna F, MD      ? [COMPLETED] iohexol (OMNIPAQUE) 9 MG/ML oral solution 500 mL  500 mL Oral Q1H Jalissa Heinzelman F, MD   500 mL at 04/08/21 1715  ? ketorolac (TORADOL) 15 MG/ML injection 15 mg  15 mg Intravenous Q6H Leanord Thibeau F, MD      ? methocarbamol (ROBAXIN) tablet 500 mg  500 mg Oral Q6H PRN Ariel Dimitri F, MD      ? metoprolol tartrate (LOPRESSOR) injection 5 mg  5 mg Intravenous Q6H PRN Hondo Nanda F, MD      ? morphine (PF) 4 MG/ML injection 4 mg  4 mg Intravenous Q3H PRN Rydan Gulyas F, MD      ? ondansetron (ZOFRAN-ODT) disintegrating tablet 4 mg  4 mg Oral Q6H PRN Zariya Minner F, MD      ? Or  ? ondansetron (ZOFRAN) injection 4 mg  4 mg Intravenous Q6H PRN Iyauna Sing F, MD      ? oxyCODONE (Oxy IR/ROXICODONE) immediate release tablet 5-10 mg  5-10 mg Oral Q4H PRN [name], MD   10 mg at  04/08/21 1657  ? pantoprazole (PROTONIX) injection 40 mg  40 mg Intravenous Q12H Danee Soller F, MD      ? piperacillin-tazobactam (ZOSYN) IVPB 3.375 g  3.375 g Intravenous Q8H Benita Gutter, RPH      ? piperacillin-tazobactam (ZOSYN) IVPB 3.375 g  3.375 g Intravenous Once Benita Gutter, RPH      ? sodium chloride 0.9 % bolus 1,000 mL  1,000 mL Intravenous Once Bettie Capistran, Marjory Lies, MD      ? ? ? ?Review of Systems ?Full ROS  was asked and was negative except for the information on the HPI ? ?Physical Exam ?Blood pressure 100/71, pulse 72, temperature 98.1 ?F (36.7 ?C), temperature source Oral, resp. rate 18, height '5\' 8"'$  (1.727 m), weight 102.2 kg, SpO2 99 %. ?CONSTITUTIONAL: non toxic but clearly in pain. ?EYES: Pupils are equal, round, and reactive to light, Sclera are non-icteric. ?EARS, NOSE, MOUTH AND THROAT: The oropharynx is clear. The oral mucosa is pink and moist. Hearing is intact to voice. ?LYMPH NODES:  Lymph nodes in the neck are normal. ?RESPIRATORY:  Lungs are clear. There is normal respiratory effort, with equal breath sounds bilaterally, and without pathologic use of accessory muscles. ?CARDIOVASCULAR: Heart is regular without murmurs, gallops, or rubs. ?GI: The abdomen is  soft, nontender, and nondistended. There are no palpable masses. There is no hepatosplenomegaly. There are normal bowel Sounds. ?Robotic incisions c/d/I. ?Drain Right abd wall purulent fluid ?Drain Left Buttocks: fecal fluid, Left buttock is tender at drain site. No evidence of gluteal or perirectal abscess ?GU: Rectal deferred.   ?MUSCULOSKELETAL: Normal muscle strength and tone. No cyanosis or edema.   ?SKIN: Turgor is good and there are no pathologic skin lesions or ulcers. ?NEUROLOGIC: Motor and sensation is grossly normal. Cranial nerves are grossly intact. ?PSYCH:  Oriented to person, place and time. Affect is normal. ? ?Data Reviewed ? ?I have personally reviewed the patient's imaging, laboratory findings and medical  records.   ? ?Assessment/Plan ?66 yo with contained anastomotic leak being able to be managed medically with drain placement +  antibiotics.  this is now turning into a fistula.   ?She now Comes in with severe excruciating pain on the buttocks unable to be controlled we will in the outpatient setting.  She will need to be admitted to the hospital we will start IV fluids, appropriate parenteral NSAID and narcotic and we will perform a CT scan of the abdomen and pelvis with p.o. and IV contrast.  She understands that she may need a diverting loop ileostomy if her leak fails any other forms.  Alternatively we may entertain endoscopic stenting but at this time I am not sure if we have the capability within the hospital to perform this.  First order business is to get her situated, adequate pain control and source control as well.  This time she does not need emergent surgical intervention she is not peritonitic. ? ? ? ?Caroleen Hamman, MD FACS ?General Surgeon ?04/08/2021, 5:00 PM ? ?  ?

## 2021-04-08 NOTE — Progress Notes (Signed)
Outpatient Surgical Follow Up ? ?04/08/2021 ? ?Stacy Cox is an 66 y.o. female.  ? ?Chief Complaint  ?Patient presents with  ? Routine Post Op  ?  Sigmoid colectomy 02/26/2021  ? ? ?HPI: Stacy Cox is 6 weeks out from long-term resection from chronic diverticulitis and abscess.  Stacy Cox developed an anastomotic leak that I was able to be managed conservatively with a drain and antibiotic therapy.  Stacy Cox comes in today with worsening left buttock pain close to the drain area.  No fevers no chills.  Decreased energy.  Pain is severe cannot keep up with it.  Stacy Cox has tried oxycodone and ibuprofen without success.  Stacy Cox is having bowel movements. ?Last CT was 12 days ago that I personally review there is evidence of the contained leak and the drain was appropriately positioned.  There was no new areas to indicate that the leak was non contain. ? ?Past Medical History:  ?Diagnosis Date  ? CHF (congestive heart failure) ([place])   ? COPD (chronic obstructive pulmonary disease) ([place])   ? Diabetes mellitus, type 2 ([place])   ? GERD (gastroesophageal reflux disease)   ? Heart disease   ? Hypertension   ? Joint pain   ? Sleep apnea   ? Tobacco abuse   ? ? ?Past Surgical History:  ?Procedure Laterality Date  ? COLONOSCOPY  2017  ? COLONOSCOPY WITH PROPOFOL N/A 01/07/2021  ? Procedure: COLONOSCOPY WITH PROPOFOL;  Surgeon: [name], MD;  Location: [facility] ENDOSCOPY;  Service: Endoscopy;  Laterality: N/A;  ? IR CATHETER TUBE CHANGE  11/25/2020  ? IR CATHETER TUBE CHANGE  11/25/2020  ? IR CATHETER TUBE CHANGE  03/30/2021  ? IR IMAGE GUIDED FLUID DRAIN BY CATHETER  03/30/2021  ? IR RADIOLOGIST EVAL & MGMT  11/19/2020  ? IR RADIOLOGIST EVAL & MGMT  12/02/2020  ? IR RADIOLOGIST EVAL & MGMT  12/09/2020  ? ? ?Family History  ?Problem Relation Age of Onset  ? Breast cancer Sister   ?     sister #1 age 24  ? Kidney cancer Sister   ?     sister #1 age -81's  ? Breast cancer Sister   ?     sister #2 age 56's  ? Kidney disease Sister   ?     sister  #2 age 52's  ? Diabetes Mother   ? Heart disease Mother   ?     + BYPASS SURGERY  ? Deep vein thrombosis Mother   ? Arthritis Mother   ? Diabetes Sister   ? Heart disease Sister   ? Hypertension Brother   ? Heart attack Father   ? ? ?Social History:  reports that Stacy Cox has been smoking cigarettes. Stacy Cox has a 40.00 pack-year smoking history. Stacy Cox has quit using smokeless tobacco.  Her smokeless tobacco use included snuff. Stacy Cox reports that Stacy Cox does not drink alcohol and does not use drugs. ? ?Allergies: No Known Allergies ? ?Medications reviewed. ? ? ? ?ROS ?Full ROS performed and is otherwise negative other than what is stated in HPI ? ? ?BP 102/70   Pulse 84   Temp 98.1 ?F (36.7 ?C) (Oral)   Ht '5\' 8"'$  (1.727 m)   Wt 225 lb 3.2 oz (102.2 kg)   BMI 34.24 kg/m?  ? ?Physical Exam ?Vitals and nursing note reviewed. Exam conducted with a chaperone present.  ?Constitutional:   ?   General: Stacy Cox is not in acute distress. ?   Appearance: Normal appearance.  ?Cardiovascular:  ?  Heart sounds: No murmur heard. ?Pulmonary:  ?   Effort: Pulmonary effort is normal. No respiratory distress.  ?   Breath sounds: Normal breath sounds. No stridor.  ?Abdominal:  ?   General: Abdomen is flat. There is no distension.  ?   Palpations: Abdomen is soft.  ?   Tenderness: There is no abdominal tenderness. There is no guarding or rebound.  ?   Hernia: No hernia is present.  ?Musculoskeletal:     ?   General: No swelling or tenderness. Normal range of motion.  ?   Cervical back: Normal range of motion and neck supple. No rigidity.  ?Skin: ?   General: Skin is warm and dry.  ?   Capillary Refill: Capillary refill takes less than 2 seconds.  ?Neurological:  ?   Mental Status: Stacy Cox is alert.  ?Psychiatric:     ?   Mood and Affect: Mood normal.     ?   Behavior: Behavior normal.     ?   Thought Content: Thought content normal.     ?   Judgment: Judgment normal.  ? ? ? ? ?Assessment/Plan: ?66 year old female with anastomotic leak following low  anterior resection.  Comes in with severe excruciating pain on the buttocks unable to be controlled we will in the outpatient setting.  Stacy Cox will need to be admitted to the hospital we will start IV fluids, appropriate parenteral NSAID and narcotic and we will perform a CT scan of the abdomen and pelvis with p.o. and IV contrast.  Stacy Cox understands that Stacy Cox may need a diverting loop ileostomy if her leak fails any other forms.  Alternatively we may entertain endoscopic stenting but at this time I am not sure if we have the capability within the hospital to perform this.  First order business is to get her situated, adequate pain control and source control as well.  This time Stacy Cox does not need emergent surgical intervention Stacy Cox is not peritonitic. ? ? ?Caroleen Hamman, MD FACS ?General Surgeon  ?

## 2021-04-09 ENCOUNTER — Inpatient Hospital Stay: Payer: Medicare HMO | Admitting: Radiology

## 2021-04-09 HISTORY — PX: IR CATHETER TUBE CHANGE: IMG717

## 2021-04-09 LAB — CBC
HCT: 35.6 % — ABNORMAL LOW (ref 36.0–46.0)
Hemoglobin: 11.7 g/dL — ABNORMAL LOW (ref 12.0–15.0)
MCH: 30 pg (ref 26.0–34.0)
MCHC: 32.9 g/dL (ref 30.0–36.0)
MCV: 91.3 fL (ref 80.0–100.0)
Platelets: 320 10*3/uL (ref 150–400)
RBC: 3.9 MIL/uL (ref 3.87–5.11)
RDW: 14.7 % (ref 11.5–15.5)
WBC: 11.2 10*3/uL — ABNORMAL HIGH (ref 4.0–10.5)
nRBC: 0 % (ref 0.0–0.2)

## 2021-04-09 LAB — BASIC METABOLIC PANEL
Anion gap: 9 (ref 5–15)
BUN: 14 mg/dL (ref 8–23)
CO2: 26 mmol/L (ref 22–32)
Calcium: 8.9 mg/dL (ref 8.9–10.3)
Chloride: 103 mmol/L (ref 98–111)
Creatinine, Ser: 0.86 mg/dL (ref 0.44–1.00)
GFR, Estimated: >60 mL/min (ref >60)
Glucose, Bld: 105 mg/dL — ABNORMAL HIGH (ref 70–99)
Potassium: 3.2 mmol/L — ABNORMAL LOW (ref 3.5–5.1)
Sodium: 138 mmol/L (ref 135–145)

## 2021-04-09 LAB — GLUCOSE, CAPILLARY
Glucose-Capillary: 111 mg/dL — ABNORMAL HIGH (ref 70–99)
Glucose-Capillary: 105 mg/dL — ABNORMAL HIGH (ref 70–99)
Glucose-Capillary: 118 mg/dL — ABNORMAL HIGH (ref 70–99)

## 2021-04-09 MED ORDER — IOHEXOL 350 MG/ML SOLN
15.0000 mL | Freq: Once | INTRAVENOUS | Status: AC | PRN
  Administered 2021-04-09: 15:00:00 15 mL

## 2021-04-09 MED ORDER — INSULIN ASPART 100 UNIT/ML IJ SOLN
0.0000 [IU] | Freq: Three times a day (TID) | INTRAMUSCULAR | Status: DC
  Administered 2021-04-10: 2 [IU] via SUBCUTANEOUS
  Administered 2021-04-14: 1 [IU] via SUBCUTANEOUS
  Filled 2021-04-09 (×2): qty 1

## 2021-04-09 MED ORDER — INSULIN ASPART 100 UNIT/ML IJ SOLN: 0.0000 [IU] | Freq: Every day | INTRAMUSCULAR | Status: DC

## 2021-04-09 MED ORDER — SODIUM CHLORIDE 0.9% FLUSH
10.0000 mL | Freq: Three times a day (TID) | INTRAVENOUS | Status: DC
  Administered 2021-04-09 – 2021-04-14 (×15): 10 mL

## 2021-04-09 MED ORDER — INSULIN ASPART 100 UNIT/ML IJ SOLN
4.0000 [IU] | Freq: Three times a day (TID) | INTRAMUSCULAR | Status: DC
  Administered 2021-04-10 – 2021-04-14 (×8): 4 [IU] via SUBCUTANEOUS
  Filled 2021-04-09 (×8): qty 1

## 2021-04-09 MED ORDER — PIPERACILLIN-TAZOBACTAM 3.375 G IVPB
3.3750 g | Freq: Three times a day (TID) | INTRAVENOUS | Status: DC
  Administered 2021-04-09 – 2021-04-14 (×15): 3.375 g via INTRAVENOUS
  Filled 2021-04-09 (×15): qty 50

## 2021-04-09 MED ORDER — LIDOCAINE HCL 1 % IJ SOLN
INTRAMUSCULAR | Status: AC
  Administered 2021-04-09: 15:00:00 20 mL
  Filled 2021-04-09: qty 20

## 2021-04-09 NOTE — TOC Initial Note (Signed)
Transition of Care (TOC) - Initial/Assessment Note  ? ? ?Patient Details  ?Name: Stacy Cox ?MRN: [id number] ?Date of Birth: 18-Apr-1955 ? ?Transition of Care (TOC) CM/SW Contact:    ?Beverly Sessions, RN ?Phone Number: ?04/09/2021, 8:45 AM ? ?Clinical Narrative:                 ? ? ?  ?Transition of Care (TOC) Screening Note ? ? ?Patient Details  ?Name: Stacy Cox ?Date of Birth: 1955-05-17 ? ? ?Transition of Care (TOC) CM/SW Contact:    ?Beverly Sessions, RN ?Phone Number: ?04/09/2021, 8:45 AM ? ? ? ?Transition of Care Department [facility]) has reviewed patient and no TOC needs have been identified at this time. We will continue to monitor patient advancement through interdisciplinary progression rounds. If new patient transition needs arise, please place a TOC consult. ? ? ?  ? ? ?Patient Goals and CMS Choice ?  ?  ?  ? ?Expected Discharge Plan and Services ?  ?  ?  ?  ?  ?                ?  ?  ?  ?  ?  ?  ?  ?  ?  ?  ? ?Prior Living Arrangements/Services ?  ?  ?  ?       ?  ?  ?  ?  ? ?Activities of Daily Living ?Home Assistive Devices/Equipment: Eyeglasses ?ADL Screening (condition at time of admission) ?Patient's cognitive ability adequate to safely complete daily activities?: Yes ?Is the patient deaf or have difficulty hearing?: No ?Does the patient have difficulty seeing, even when wearing glasses/contacts?: No ?Does the patient have difficulty concentrating, remembering, or making decisions?: No ?Patient able to express need for assistance with ADLs?: Yes ?Does the patient have difficulty dressing or bathing?: No ?Independently performs ADLs?: Yes (appropriate for developmental age) ?Does the patient have difficulty walking or climbing stairs?: No ?Weakness of Legs: None ?Weakness of Arms/Hands: None ? ?Permission Sought/Granted ?  ?  ?   ?   ?   ?   ? ?Emotional Assessment ?  ?  ?  ?  ?  ?  ? ?Admission diagnosis:  Abdominal pain [R10.9] ?Patient Active Problem List  ? Diagnosis Date Noted  ? Postoperative  intra-abdominal abscess 04/08/2021  ? Abdominal pain 04/08/2021  ? S/P laparoscopic colectomy 02/26/2021  ? Rectal polyp   ? Erythema of colon   ? Polyp of ascending colon   ? Chronic systolic CHF (congestive heart failure) ([place]) 11/06/2020  ? Obesity, Class III, BMI 40-49.9 (morbid obesity) ([place]) 11/06/2020  ? Drop in hemoglobin   ? AF (paroxysmal atrial fibrillation) ([place])   ? Essential hypertension   ? Chronic diastolic CHF (congestive heart failure) ([place])   ? Lactic acidosis   ? Hypokalemia   ? Acute respiratory failure ([place])   ? Liver abscess 11/02/2020  ? COPD (chronic obstructive pulmonary disease) ([place])   ? CHF (congestive heart failure) ([place])   ? Acute diverticulitis 05/02/2015  ? Sepsis secondary to acute diverticulitis 05/02/2015  ? Tobacco abuse 05/02/2015  ? Type 2 diabetes mellitus  05/02/2015  ? Polycythemia vera ([place]) 12/13/2014  ? ?PCP:  Donnie Coffin, MD ?Pharmacy:   ?[place], [address] ?[place] ?[place] [address] ?Phone: [phone] Fax: [phone] ? ?[facility] ([facility]) - [place], [place] [facility]  [address] ?[address] [place] ?[place] [address] ?Phone: [phone] Fax: [phone] ? ? ? ? ?Social Determinants of Health (SDOH) Interventions ?  ? ?Readmission Risk Interventions ?Readmission Risk Prevention Plan 03/04/2021  ?Transportation Screening Complete  ?PCP or Specialist Appt within 5-7 Days Complete  ?Medication Review (RN CM) Complete  ?Some recent data might be hidden  ? ? ? ?

## 2021-04-09 NOTE — Progress Notes (Signed)
Closed system drain in left buttocks repositioned in IR today.   ?

## 2021-04-10 DIAGNOSIS — R109 Unspecified abdominal pain: Secondary | ICD-10-CM

## 2021-04-10 LAB — CBC WITH DIFFERENTIAL/PLATELET
Abs Immature Granulocytes: 0.03 10*3/uL (ref 0.00–0.07)
Basophils Absolute: 0 10*3/uL (ref 0.0–0.1)
Basophils Relative: 0 %
Eosinophils Absolute: 0.2 10*3/uL (ref 0.0–0.5)
Eosinophils Relative: 2 %
HCT: 34.1 % — ABNORMAL LOW (ref 36.0–46.0)
Hemoglobin: 11.5 g/dL — ABNORMAL LOW (ref 12.0–15.0)
Immature Granulocytes: 0 %
Lymphocytes Relative: 27 %
Lymphs Abs: 2.7 10*3/uL (ref 0.7–4.0)
MCH: 30.9 pg (ref 26.0–34.0)
MCHC: 33.7 g/dL (ref 30.0–36.0)
MCV: 91.7 fL (ref 80.0–100.0)
Monocytes Absolute: 0.8 10*3/uL (ref 0.1–1.0)
Monocytes Relative: 9 %
Neutro Abs: 6 10*3/uL (ref 1.7–7.7)
Neutrophils Relative %: 62 %
Platelets: 314 10*3/uL (ref 150–400)
RBC: 3.72 MIL/uL — ABNORMAL LOW (ref 3.87–5.11)
RDW: 14.8 % (ref 11.5–15.5)
WBC: 9.8 10*3/uL (ref 4.0–10.5)
nRBC: 0 % (ref 0.0–0.2)

## 2021-04-10 LAB — BASIC METABOLIC PANEL
Anion gap: 6 (ref 5–15)
BUN: 11 mg/dL (ref 8–23)
CO2: 26 mmol/L (ref 22–32)
Calcium: 8.5 mg/dL — ABNORMAL LOW (ref 8.9–10.3)
Chloride: 107 mmol/L (ref 98–111)
Creatinine, Ser: 0.82 mg/dL (ref 0.44–1.00)
GFR, Estimated: >60 mL/min (ref >60)
Glucose, Bld: 97 mg/dL (ref 70–99)
Potassium: 3.1 mmol/L — ABNORMAL LOW (ref 3.5–5.1)
Sodium: 139 mmol/L (ref 135–145)

## 2021-04-10 LAB — GLUCOSE, CAPILLARY
Glucose-Capillary: 102 mg/dL — ABNORMAL HIGH (ref 70–99)
Glucose-Capillary: 148 mg/dL — ABNORMAL HIGH (ref 70–99)
Glucose-Capillary: 91 mg/dL (ref 70–99)

## 2021-04-10 MED ORDER — POTASSIUM CHLORIDE 20 MEQ PO PACK
40.0000 meq | PACK | Freq: Two times a day (BID) | ORAL | Status: DC
  Administered 2021-04-10: 40 meq via ORAL
  Filled 2021-04-10 (×2): qty 2

## 2021-04-10 MED ORDER — SODIUM CHLORIDE 0.9 % IV SOLN: INTRAVENOUS | Status: DC | PRN

## 2021-04-10 NOTE — Progress Notes (Addendum)
CC: anastomotic leak ?Subjective: ?Feels better, CT scan pers. Reviewed and d/w personally w Dr Denna Haggard. Pelvic drain not draianing collection. She will have catheter reposition by IR under fluoro. ?She feels better today, no fevers ?AVSS ?WBC coming down ? ?Objective: ? ?Physical exam: ? ?NAd alert ?Abd: soft nt, no peritonitis ?Right blake drain w some purulent drainage. ?Left buttocks tender where drain site is, purulent drainage within drain ? ?Lab Results: ?CBC PT/INR ?No results for input(s): LABPROT, INR in the last 72 hours. ?ABG ?No results for input(s): PHART, HCO3 in the last 72 hours. ? ?Invalid input(s): PCO2, PO2 ? ?Studies/Results: ?CT ABDOMEN PELVIS W CONTRAST ? ?Result Date: 04/08/2021 ?CLINICAL DATA:  Worsening left buttock pain today. History of chronic diverticulitis with anastomotic leak treated conservatively with a pelvic drain and antibiotic therapy. EXAM: CT ABDOMEN AND PELVIS WITH CONTRAST TECHNIQUE: Multidetector CT imaging of the abdomen and pelvis was performed using the standard protocol following bolus administration of intravenous contrast. RADIATION DOSE REDUCTION: This exam was performed according to the departmental dose-optimization program which includes automated exposure control, adjustment of the mA and/or kV according to patient size and/or use of iterative reconstruction technique. CONTRAST:  146m OMNIPAQUE IOHEXOL 300 MG/ML  SOLN COMPARISON:  Abdominopelvic CT 03/26/2021 and 03/04/2021. FINDINGS: Lower chest: Stable mild pleural thickening and subpleural scarring anteriorly at the right lung base. No significant pleural or pericardial effusion. Atherosclerosis of coronary arteries noted. Hepatobiliary: The liver is normal in density without suspicious focal abnormality. No evidence of gallstones, gallbladder wall thickening or biliary dilatation. Pancreas: Unremarkable. No pancreatic ductal dilatation or surrounding inflammatory changes. Spleen: Normal in size without focal  abnormality. Adrenals/Urinary Tract: Stable 2.7 x 1.4 cm left adrenal nodule, previously characterized as an adenoma. The right adrenal gland appears normal. 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 No evidence of ureteral calculus or high-grade ureteral obstruction. There are stable small left renal cysts. The bladder appears unremarkable for its degree of distention. Stomach/Bowel: Enteric contrast was administered and has passed into the mid colon. The stomach appears unremarkable for its degree of distension. No evidence of bowel wall thickening, distention or surrounding inflammatory change. Distal colonic surgical anastomosis noted. No extravasated enteric contrast administered. Vascular/Lymphatic: There are no enlarged abdominal or pelvic lymph nodes. Aortic and branch vessel atherosclerosis without aneurysm or large vessel occlusion. The portal, superior mesenteric and splenic veins are patent. Reproductive: The uterus and ovaries appear unchanged, without adnexal mass. Previously demonstrated pelvic air-fluid collection extends along the posterior right aspect of the uterus as before. Other: Subcutaneous fluid collection in the right lower quadrant anterior wall has significantly decreased in size in the interval, now measuring 7.5 x 1.9 cm on image 59/2 (previously 10.1 x 5.3 cm). Adjacent right lower quadrant surgical drain is unchanged in position, without focal fluid collection along the course of the strain. The left transgluteal drain extends into the left perirectal region, slightly more peripherally located than previously. This is not definitely communicate with a small residual collection of fluid and gas measuring approximately 5.1 x 1.5 cm on image 74/2. This collection has slightly decreased in overall size compared with the most recent CT. The interloop collection in the right lower quadrant has decreased in size, measuring 3.0 x 2.3 cm on image 52/2 (previously  3.6 x 2.3 cm). The small collection in the left pericolic gutter has nearly completely resolved. No new or enlarging collections are identified. Musculoskeletal: No acute or significant osseous findings. Mild spondylosis. IMPRESSION: 1. The recently  repositioned left transgluteal drain is positioned posterior to the residual small fluid and gas collection adjacent to the distal colonic anastomosis. This collection has decreased in size from the most recent prior CT. No new collections are seen along the course of the drain. 2. Additional intra-abdominal and right anterior abdominal wall fluid collections have also improved. No evidence of enteric contrast leak, new or enlarging fluid collections. 3. No acute findings demonstrated. 4.  Aortic Atherosclerosis (ICD10-I70.0). Electronically Signed   By: Richardean Sale M.D.   On: 04/08/2021 20:51  ? ?IR Catheter Tube Change ? ?Result Date: 04/09/2021 ?INDICATION: Fistula EXAM: Intra-abdominal exchange and reposition using fluoroscopic guidance MEDICATIONS: The patient is currently admitted to the hospital and receiving intravenous antibiotics. The antibiotics were administered within an appropriate time frame prior to the initiation of the procedure. ANESTHESIA/SEDATION: Local analgesia FLUOROSCOPY TIME:  8.7 minutes (782 mGy) COMPLICATIONS: None immediate. PROCEDURE: Informed written consent was obtained from the patient after a thorough discussion of the procedural risks, benefits and alternatives. All questions were addressed. Maximal Sterile Barrier Technique was utilized including caps, mask, sterile gowns, sterile gloves, sterile drape, hand hygiene and skin antiseptic. A timeout was performed prior to the initiation of the procedure. The patient was placed in the lateral decubitus position on the exam table. The lower back was prepped and draped in the standard sterile fashion with inclusion of the existing drainage catheter within the sterile field. Initial  evaluation of the drainage catheter was performed with injection of contrast material. This demonstrated a decompressed cavity. The drainage catheter appears retracted. There is a channel leading to a larger cavity identified. The locking loop of the catheter was released. Initial attempts to remove the catheter over an Amplatz wire were unsuccessful. Using a stiff Glidewire, the existing drainage catheter was removed. A angled catheter was advanced over the Glidewire, and using this catheter and wire combination, access was obtained into the larger cavity. Wire was exchanged for an Amplatz wire, and over this wire a new 12 [place] multipurpose locking drainage catheter was advanced into the abscess cavity. Location was confirmed with injection of contrast material. It was secured to skin using silk suture. A clean dressing was placed. It was attached to bag drainage. No immediate complication. IMPRESSION: Successful exchange and reposition of the trans gluteal drainage catheter for a new 12 French locking drainage catheter which was advanced into the more anterior portion of the pelvic collection. Electronically Signed   By: Albin Felling M.D.   On: 04/09/2021 15:52   ? ?Anti-infectives: ?Anti-infectives (From admission, onward)  ? ? Start     Dose/Rate Route Frequency Ordered Stop  ? 04/09/21 1400  piperacillin-tazobactam (ZOSYN) IVPB 3.375 g       ? 3.375 g ?12.5 mL/hr over 240 Minutes Intravenous Every 8 hours 04/09/21 1356 04/18/21 2159  ? 04/08/21 2200  piperacillin-tazobactam (ZOSYN) IVPB 3.375 g  Status:  Discontinued       ? 3.375 g ?12.5 mL/hr over 240 Minutes Intravenous Every 8 hours 04/08/21 1544 04/09/21 1356  ? 04/08/21 1630  piperacillin-tazobactam (ZOSYN) IVPB 3.375 g       ? 3.375 g ?100 mL/hr over 30 Minutes Intravenous  Once 04/08/21 1544 04/08/21 2213  ? ?  ? ? ?Assessment/Plan: ? 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 IR to reposition drain ?We will continue pain rx and  antibiotics for now ?If drain fails to contained fistula may need further procedure such as diverting ileostomy, no there yet ?No need  for emergent surgical intervention. ? ? ?Caroleen Hamman, MD, FACS ? ?04/10/2021 ?

## 2021-04-10 NOTE — Consult Note (Signed)
Pharmacy Antibiotic Note ? ?Stacy Cox is a 66 y.o. female with medical history including complicated diverticulitis s/p sigmoid colectomy on 8/45/36 complicated by anastomotic leak and abscess which was managed with a transgluteal drain placed 03/05/21. New CT on 2/23 with evidence of new collection within right abdominal wall. IR placed percutaneous drain into new fluid collection and exchanged pelvic drain on 2/27. Appears patient was receiving therapy with ciprofloxacin and then Augmentin as outpatient. Patient now admitted on 04/08/2021 with  severe pain on the buttocks .  Pharmacy has been consulted for Zosyn dosing. ? ?Of note: patient has cultured pan-sensitive Pseudomonas aeruginosa on 2/27 from abscess and VRE, Stenotrophomonas maltophilia, and candida tropicalis from pelvis culture on 03/04/21. She also has a history of Streptococcus infection. ? ?Plan: ?Zosyn 3.375 g IV q8h (4-hr infusion) through 04/18/21 ? ? ?Temp (24hrs), Avg:98.3 ?F (36.8 ?C), Min:97.8 ?F (36.6 ?C), Max:98.6 ?F (37 ?C) ? ?Recent Labs  ?Lab 04/08/21 ?1533 04/09/21 ?4680 04/10/21 ?3212  ?WBC 14.1* 11.2* 9.8  ?CREATININE 0.89 0.86 0.82  ? ?  ?Estimated Creatinine Clearance: 87.8 mL/min (by C-G formula based on SCr of 0.82 mg/dL).   ? ?No Known Allergies ? ?Antimicrobials this admission: ?Zosyn 3/8 >>  ? ?Dose adjustments this admission: ?N/A ? ?Microbiology results: ?2/27 Abscess culture: Pan-sensitive PSA ?2/1 Pelvis culture: VRE, Stenotrophomonas maltophilia, Candida tropicalis ? ?Thank you for allowing pharmacy to be a part of this patient?s care. ? ?Oluwaseun Cremer A ?04/10/2021 10:47 AM ? ?

## 2021-04-10 NOTE — Progress Notes (Signed)
CC: Anastomotic leak s/p LAR Subjective:  Feeling better after drain is repositioned. She is hungry Images personally reviewed from manipulation.  The catheter is in a better position. Now draining some brown scant stool. AVss Nml WBC Low potassium   Objective: Vital signs in last 24 hours: Temp:  [97.8 F (36.6 C)-98.6 F (37 C)] 98.6 F (37 C) (03/10 0805) Pulse Rate:  [45-68] 52 (03/10 0805) Resp:  [16-18] 16 (03/10 0805) BP: (87-119)/(50-71) 119/68 (03/10 0805) SpO2:  [97 %-100 %] 97 % (03/10 0805) Last BM Date : 04/09/21  Intake/Output from previous day: 03/09 0701 - 03/10 0700 In: 2148.3 [I.V.:2048.3; IV Piggyback:100] Out: 5 [Drains:5] Intake/Output this shift: No intake/output data recorded.  Physical exam: NAd alert Abd: soft nt, no peritonitis Right blake drain w some purulent drainage. Left buttocks tender where drain site is, feculent output with thin transgluteal drain  Lab Results: CBC  Recent Labs    04/09/21 0536 04/10/21 0829  WBC 11.2* 9.8  HGB 11.7* 11.5*  HCT 35.6* 34.1*  PLT 320 314   BMET Recent Labs    04/09/21 0536 04/10/21 0829  NA 138 139  K 3.2* 3.1*  CL 103 107  CO2 26 26  GLUCOSE 105* 97  BUN 14 11  CREATININE 0.86 0.82  CALCIUM 8.9 8.5*   PT/INR No results for input(s): LABPROT, INR in the last 72 hours. ABG No results for input(s): PHART, HCO3 in the last 72 hours.  Invalid input(s): PCO2, PO2  Studies/Results: CT ABDOMEN PELVIS W CONTRAST  Result Date: 04/08/2021 CLINICAL DATA:  Worsening left buttock pain today. History of chronic diverticulitis with anastomotic leak treated conservatively with a pelvic drain and antibiotic therapy. EXAM: CT ABDOMEN AND PELVIS WITH CONTRAST TECHNIQUE: Multidetector CT imaging of the abdomen and pelvis was performed using the standard protocol following bolus administration of intravenous contrast. RADIATION DOSE REDUCTION: This exam was performed according to the departmental  dose-optimization program which includes automated exposure control, adjustment of the mA and/or kV according to patient size and/or use of iterative reconstruction technique. CONTRAST:  14m OMNIPAQUE IOHEXOL 300 MG/ML  SOLN COMPARISON:  Abdominopelvic CT 03/26/2021 and 03/04/2021. FINDINGS: Lower chest: Stable mild pleural thickening and subpleural scarring anteriorly at the right lung base. No significant pleural or pericardial effusion. Atherosclerosis of coronary arteries noted. Hepatobiliary: The liver is normal in density without suspicious focal abnormality. No evidence of gallstones, gallbladder wall thickening or biliary dilatation. Pancreas: Unremarkable. No pancreatic ductal dilatation or surrounding inflammatory changes. Spleen: Normal in size without focal abnormality. Adrenals/Urinary Tract: Stable 2.7 x 1.4 cm left adrenal nodule, previously characterized as an adenoma. The right adrenal gland appears normal. 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 No evidence of ureteral calculus or high-grade ureteral obstruction. There are stable small left renal cysts. The bladder appears unremarkable for its degree of distention. Stomach/Bowel: Enteric contrast was administered and has passed into the mid colon. The stomach appears unremarkable for its degree of distension. No evidence of bowel wall thickening, distention or surrounding inflammatory change. Distal colonic surgical anastomosis noted. No extravasated enteric contrast administered. Vascular/Lymphatic: There are no enlarged abdominal or pelvic lymph nodes. Aortic and branch vessel atherosclerosis without aneurysm or large vessel occlusion. The portal, superior mesenteric and splenic veins are patent. Reproductive: The uterus and ovaries appear unchanged, without adnexal mass. Previously demonstrated pelvic air-fluid collection extends along the posterior right aspect of the uterus as before. Other: Subcutaneous fluid  collection in the right lower quadrant anterior wall  has significantly decreased in size in the interval, now measuring 7.5 x 1.9 cm on image 59/2 (previously 10.1 x 5.3 cm). Adjacent right lower quadrant surgical drain is unchanged in position, without focal fluid collection along the course of the strain. The left transgluteal drain extends into the left perirectal region, slightly more peripherally located than previously. This is not definitely communicate with a small residual collection of fluid and gas measuring approximately 5.1 x 1.5 cm on image 74/2. This collection has slightly decreased in overall size compared with the most recent CT. The interloop collection in the right lower quadrant has decreased in size, measuring 3.0 x 2.3 cm on image 52/2 (previously 3.6 x 2.3 cm). The small collection in the left pericolic gutter has nearly completely resolved. No new or enlarging collections are identified. Musculoskeletal: No acute or significant osseous findings. Mild spondylosis. IMPRESSION: 1. The recently repositioned left transgluteal drain is positioned posterior to the residual small fluid and gas collection adjacent to the distal colonic anastomosis. This collection has decreased in size from the most recent prior CT. No new collections are seen along the course of the drain. 2. Additional intra-abdominal and right anterior abdominal wall fluid collections have also improved. No evidence of enteric contrast leak, new or enlarging fluid collections. 3. No acute findings demonstrated. 4.  Aortic Atherosclerosis (ICD10-I70.0). Electronically Signed   By: Richardean Sale M.D.   On: 04/08/2021 20:51   IR Catheter Tube Change  Result Date: 04/09/2021 INDICATION: Fistula EXAM: Intra-abdominal exchange and reposition using fluoroscopic guidance MEDICATIONS: The patient is currently admitted to the hospital and receiving intravenous antibiotics. The antibiotics were administered within an appropriate time  frame prior to the initiation of the procedure. ANESTHESIA/SEDATION: Local analgesia FLUOROSCOPY TIME:  8.7 minutes (017 mGy) COMPLICATIONS: None immediate. PROCEDURE: Informed written consent was obtained from the patient after a thorough discussion of the procedural risks, benefits and alternatives. All questions were addressed. Maximal Sterile Barrier Technique was utilized including caps, mask, sterile gowns, sterile gloves, sterile drape, hand hygiene and skin antiseptic. A timeout was performed prior to the initiation of the procedure. The patient was placed in the lateral decubitus position on the exam table. The lower back was prepped and draped in the standard sterile fashion with inclusion of the existing drainage catheter within the sterile field. Initial evaluation of the drainage catheter was performed with injection of contrast material. This demonstrated a decompressed cavity. The drainage catheter appears retracted. There is a channel leading to a larger cavity identified. The locking loop of the catheter was released. Initial attempts to remove the catheter over an Amplatz wire were unsuccessful. Using a stiff Glidewire, the existing drainage catheter was removed. A angled catheter was advanced over the Glidewire, and using this catheter and wire combination, access was obtained into the larger cavity. Wire was exchanged for an Amplatz wire, and over this wire a new 12 [place] multipurpose locking drainage catheter was advanced into the abscess cavity. Location was confirmed with injection of contrast material. It was secured to skin using silk suture. A clean dressing was placed. It was attached to bag drainage. No immediate complication. IMPRESSION: Successful exchange and reposition of the trans gluteal drainage catheter for a new 12 French locking drainage catheter which was advanced into the more anterior portion of the pelvic collection. Electronically Signed   By: Albin Felling M.D.   On:  04/09/2021 15:52    Anti-infectives: Anti-infectives (From admission, onward)    Start  Dose/Rate Route Frequency Ordered Stop   04/09/21 1400  piperacillin-tazobactam (ZOSYN) IVPB 3.375 g        3.375 g 12.5 mL/hr over 240 Minutes Intravenous Every 8 hours 04/09/21 1356 04/18/21 2159   04/08/21 2200  piperacillin-tazobactam (ZOSYN) IVPB 3.375 g  Status:  Discontinued        3.375 g 12.5 mL/hr over 240 Minutes Intravenous Every 8 hours 04/08/21 1544 04/09/21 1356   04/08/21 1630  piperacillin-tazobactam (ZOSYN) IVPB 3.375 g        3.375 g 100 mL/hr over 30 Minutes Intravenous  Once 04/08/21 1544 04/08/21 2213       Assessment/Plan: 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 I had an extensive discussion with the patient regarding her situations.  She was frustrated because of the pain.  I did explain to her that trans gluteal pelvic drain that is causing pain will have to stain there regardless whether or not we do a diverting loop ileostomy. She wishes to continue current care with parenteral antibiotics and appropriate replacement of electrolytes as well as pain care. We will advance her diet slowly. She continues to require hospitalization due to the need for IV antibiotics, IV pain medicines. We will anticipate that she will stay over the weekend and we will continue IV antibiotics at least through the weekend. No need for emergent surgical intervention Please note that I spent greater than 50 minutes in this encounter including coordination of her care, personally reviewing imaging studies, placing orders and performing appropriate documentation.   Caroleen Hamman, MD, [facility]  04/10/2021

## 2021-04-10 NOTE — Care Management Important Message (Signed)
Important Message ? ?Patient Details  ?Name: Stacy Cox ?MRN: [id number] ?Date of Birth: [birth year] ? ? ?Medicare Important Message Given:  N/A - LOS <3 / Initial given by admissions ? ? ? ? ?Dannette Barbara ?04/10/2021, 8:20 AM ?

## 2021-04-11 LAB — BASIC METABOLIC PANEL
Anion gap: 6 (ref 5–15)
BUN: 17 mg/dL (ref 8–23)
CO2: 24 mmol/L (ref 22–32)
Calcium: 8.6 mg/dL — ABNORMAL LOW (ref 8.9–10.3)
Chloride: 110 mmol/L (ref 98–111)
Creatinine, Ser: 0.84 mg/dL (ref 0.44–1.00)
GFR, Estimated: >60 mL/min (ref >60)
Glucose, Bld: 100 mg/dL — ABNORMAL HIGH (ref 70–99)
Potassium: 3.3 mmol/L — ABNORMAL LOW (ref 3.5–5.1)
Sodium: 140 mmol/L (ref 135–145)

## 2021-04-11 LAB — GLUCOSE, CAPILLARY
Glucose-Capillary: 99 mg/dL (ref 70–99)
Glucose-Capillary: 84 mg/dL (ref 70–99)
Glucose-Capillary: 113 mg/dL — ABNORMAL HIGH (ref 70–99)
Glucose-Capillary: 123 mg/dL — ABNORMAL HIGH (ref 70–99)

## 2021-04-11 MED ORDER — ENOXAPARIN SODIUM 40 MG/0.4ML IJ SOSY
40.0000 mg | PREFILLED_SYRINGE | INTRAMUSCULAR | Status: DC
  Administered 2021-04-11 – 2021-04-13 (×2): 40 mg via SUBCUTANEOUS
  Filled 2021-04-11 (×2): qty 0.4

## 2021-04-11 MED ORDER — POTASSIUM CHLORIDE CRYS ER 20 MEQ PO TBCR
40.0000 meq | EXTENDED_RELEASE_TABLET | Freq: Two times a day (BID) | ORAL | Status: DC
Start: 2021-04-11 — End: 2021-04-14
  Administered 2021-04-11 – 2021-04-14 (×7): 40 meq via ORAL
  Filled 2021-04-11 (×7): qty 2

## 2021-04-11 MED ORDER — PANTOPRAZOLE SODIUM 40 MG PO TBEC
40.0000 mg | DELAYED_RELEASE_TABLET | Freq: Two times a day (BID) | ORAL | Status: DC
  Administered 2021-04-11 – 2021-04-14 (×6): 40 mg via ORAL
  Filled 2021-04-11 (×6): qty 1

## 2021-04-11 NOTE — Progress Notes (Signed)
PHARMACIST - PHYSICIAN COMMUNICATION ? ?CONCERNING: IV to Oral Route Change Policy ? ?RECOMMENDATION: ?This patient is receiving pantoprazole by the intravenous route.  Based on criteria approved by the Pharmacy and Therapeutics Committee, the intravenous medication(s) is/are being converted to the equivalent oral dose form(s). ? ? ?DESCRIPTION: ?These criteria include: ?The patient is eating (either orally or via tube) and/or has been taking other orally administered medications for a least 24 hours ?The patient has no evidence of active gastrointestinal bleeding or impaired GI absorption (gastrectomy, short bowel, patient on TNA or NPO). ? ?If you have questions about this conversion, please contact the Pharmacy Department  ? ?Benita Gutter, RPH ?04/11/2021 10:11 AM  ?

## 2021-04-11 NOTE — Progress Notes (Signed)
Dressing changed to gluteal drain and jp drain. Clean, dry and intact.  ?

## 2021-04-11 NOTE — Progress Notes (Signed)
Subjective:  ?CC: ?Stacy Cox is a 66 y.o. 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 ?HPI: ?No acute issues overnight.  Tolerating soft diet, no worsening pain. ? ?ROS:  ?General: Denies weight loss, weight gain, fatigue, fevers, chills, and night sweats. ?Heart: Denies chest pain, palpitations, racing heart, irregular heartbeat, leg pain or swelling, and decreased activity tolerance. ?Respiratory: Denies breathing difficulty, shortness of breath, wheezing, cough, and sputum. ?GI: Denies change in appetite, heartburn, nausea, vomiting, constipation, diarrhea, and blood in stool. ?GU: Denies difficulty urinating, pain with urinating, urgency, frequency, blood in urine. ? ? ?Objective:  ? ?Temp:  [98 ?F (36.7 ?C)-99.1 ?F (37.3 ?C)] 98 ?F (36.7 ?C) (03/11 6754) ?Pulse Rate:  [51-75] 55 (03/11 0743) ?Resp:  [18-19] 18 (03/11 0743) ?BP: (126-133)/(42-69) 128/69 (03/11 0743) ?SpO2:  [95 %-100 %] 99 % (03/11 0743)     Height: '5\' 8"'$  (172.7 cm) Weight: 102.2 kg BMI (Calculated): 34.25  ? ?Intake/Output this shift:  ? ?Intake/Output Summary (Last 24 hours) at 04/11/2021 1056 ?Last data filed at 04/11/2021 4920 ?Gross per 24 hour  ?Intake 177.02 ml  ?Output 12.5 ml  ?Net 164.52 ml  ? ? ?Constitutional :  alert, cooperative, appears stated age, and no distress  ?Respiratory:  clear to auscultation bilaterally  ?Cardiovascular:  regular rate and rhythm  ?Gastrointestinal: soft, non-tender; bowel sounds normal; no masses,  no organomegaly. Abdominal drain with scant purulent drainage.  Gluteal drainage with feculant output  ?Skin: Cool and moist. Drain site TTP in gluteal region.  ?Psychiatric: Normal affect, non-agitated, not confused  ?   ?  ?LABS:  ?CMP Latest Ref Rng & Units 04/11/2021 04/10/2021 04/09/2021  ?Glucose 70 - 99 mg/dL 100(H) 97 105(H)  ?BUN 8 - 23 mg/dL '17 11 14  '$ ?Creatinine 0.44 - 1.00 mg/dL 0.84 0.82 0.86  ?Sodium 135 - 145 mmol/L 140 139 138  ?Potassium 3.5 - 5.1  mmol/L 3.3(L) 3.1(L) 3.2(L)  ?Chloride 98 - 111 mmol/L 110 107 103  ?CO2 22 - 32 mmol/L '24 26 26  '$ ?Calcium 8.9 - 10.3 mg/dL 8.6(L) 8.5(L) 8.9  ?Total Protein 6.5 - 8.1 g/dL - - -  ?Total Bilirubin 0.3 - 1.2 mg/dL - - -  ?Alkaline Phos 38 - 126 U/L - - -  ?AST 15 - 41 U/L - - -  ?ALT 0 - 44 U/L - - -  ? ?CBC Latest Ref Rng & Units 04/10/2021 04/09/2021 04/08/2021  ?WBC 4.0 - 10.5 K/uL 9.8 11.2(H) 14.1(H)  ?Hemoglobin 12.0 - 15.0 g/dL 11.5(L) 11.7(L) 14.1  ?Hematocrit 36.0 - 46.0 % 34.1(L) 35.6(L) 41.5  ?Platelets 150 - 400 K/uL 314 320 384  ? ? ?RADS: ?N/a ?Assessment:  ? 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  Stable.  Will continue IV abx and pain control for now. ? ?labs/images/medications/previous chart entries reviewed personally and relevant changes/updates noted above. ? ? ?

## 2021-04-12 LAB — GLUCOSE, CAPILLARY
Glucose-Capillary: 95 mg/dL (ref 70–99)
Glucose-Capillary: 94 mg/dL (ref 70–99)
Glucose-Capillary: 85 mg/dL (ref 70–99)
Glucose-Capillary: 93 mg/dL (ref 70–99)

## 2021-04-13 LAB — GLUCOSE, CAPILLARY
Glucose-Capillary: 118 mg/dL — ABNORMAL HIGH (ref 70–99)
Glucose-Capillary: 104 mg/dL — ABNORMAL HIGH (ref 70–99)
Glucose-Capillary: 98 mg/dL (ref 70–99)
Glucose-Capillary: 94 mg/dL (ref 70–99)
Glucose-Capillary: 96 mg/dL (ref 70–99)

## 2021-04-13 MED ORDER — OXYCODONE HCL 5 MG PO TABS
5.0000 mg | ORAL_TABLET | ORAL | Status: DC | PRN
  Administered 2021-04-13: 5 mg via ORAL
  Filled 2021-04-13: qty 1

## 2021-04-13 MED ORDER — DIPHENHYDRAMINE HCL 12.5 MG/5ML PO ELIX
12.5000 mg | ORAL_SOLUTION | Freq: Every evening | ORAL | Status: DC | PRN
  Filled 2021-04-13: qty 5

## 2021-04-13 MED ORDER — IBUPROFEN 400 MG PO TABS
600.0000 mg | ORAL_TABLET | Freq: Three times a day (TID) | ORAL | Status: DC | PRN
  Administered 2021-04-14: 600 mg via ORAL
  Filled 2021-04-13: qty 2

## 2021-04-13 NOTE — Progress Notes (Signed)
04/13/2021 ? ?Subjective: ?No acute events.  Patient denies any abdominal pain.  Pain at the drain site is controlled.  Has had low output from the drains.  Tolerating diet, having bowel function. ? ?Vital signs: ?Temp:  [97.9 ?F (36.6 ?C)-99 ?F (37.2 ?C)] 97.9 ?F (36.6 ?C) (03/13 0850) ?Pulse Rate:  [51-70] 53 (03/13 0850) ?Resp:  [18-19] 19 (03/13 0850) ?BP: (135-156)/(54-74) 135/67 (03/13 0850) ?SpO2:  [97 %-100 %] 100 % (03/13 0850)  ? ?Intake/Output: ?03/12 0701 - 03/13 0700 ?In: 205.2 [I.V.:24.2; IV Piggyback:171] ?Out: 5 [Drains:5] ?Last BM Date : 04/12/21 ? ?Physical Exam: ?Constitutional: No acute distress ?Abdomen:  soft, non-distended, non-tender.  Drains with purulent/feculent low volume  ? ?Labs:  ?No results for input(s): WBC, HGB, HCT, PLT in the last 72 hours. ?Recent Labs  ?  04/11/21 ?0408  ?NA 140  ?K 3.3*  ?CL 110  ?CO2 24  ?GLUCOSE 100*  ?BUN 17  ?CREATININE 0.84  ?CALCIUM 8.6*  ? ?No results for input(s): LABPROT, INR in the last 72 hours. ? ?Imaging: ?No results found. ? ?Assessment/Plan: ?This is a 66 y.o. female with anastomotic leak and percutaneous drainage. ? ?--Patient's pain has improved over the weekend with the drain being repositioned.  She's tolerating a diet and is having bowel function.  Her WBC also normalized.   ?--Discussed with her that today would transition to oral medications with plans for d/c to home tomorrow.  Patient is in agreement with this plan and all of her questions have been answered. ? ? ?I spent 25 minutes dedicated to the care of this patient on the date of this encounter to include pre-visit review of records, face-to-face time with the patient discussing diagnosis and management, and any post-visit coordination of care. ? ?Melvyn Neth, MD ?[facility]  ?

## 2021-04-13 NOTE — Consult Note (Signed)
Pharmacy Antibiotic Note [name] is a 66 y.o. female with medical history including complicated diverticulitis s/p sigmoid colectomy on 07/26/61 complicated by anastomotic leak and abscess which was managed with a transgluteal drain placed 03/05/21. New CT on 2/23 with evidence of new collection within right abdominal wall. IR placed percutaneous drain into new fluid collection and exchanged pelvic drain on 2/27. Appears patient was receiving therapy with ciprofloxacin and then Augmentin as outpatient. Patient now admitted on 04/08/2021 with  severe pain on the buttocks .  Pharmacy has been consulted for Zosyn dosing. ? ?Of note: patient has cultured pan-sensitive Pseudomonas aeruginosa on 2/27 from abscess and VRE, Stenotrophomonas maltophilia, and candida tropicalis from pelvis culture on 03/04/21. She also has a history of Streptococcus infection. ? ?Plan: ?Zosyn 3.375 g IV q8h (4-hr infusion) through 04/18/21 ? ? ? ?Temp (24hrs), Avg:98.4 ?F (36.9 ?C), Min:97.9 ?F (36.6 ?C), Max:99 ?F (37.2 ?C) ? ?Recent Labs  ?Lab 04/08/21 ?1533 04/09/21 ?8937 04/10/21 ?3428 04/11/21 ?0408  ?WBC 14.1* 11.2* 9.8  --   ?CREATININE 0.89 0.86 0.82 0.84  ? ?  ?Estimated Creatinine Clearance: 85.7 mL/min (by C-G formula based on SCr of 0.84 mg/dL).   ? ?No Known Allergies ? ?Antimicrobials this admission: ?Zosyn 3/8 >>  ? ?Dose adjustments this admission: ?N/A ? ?Microbiology results: ?2/27 Abscess culture: Pan-sensitive PSA ?2/1 Pelvis culture: VRE, Stenotrophomonas maltophilia, Candida tropicalis ? ?Thank you for allowing pharmacy to be a part of this patientStacys care. ? ?Leul Narramore A ?04/13/2021 12:52 PM ? ?

## 2021-04-14 LAB — GLUCOSE, CAPILLARY
Glucose-Capillary: 84 mg/dL (ref 70–99)
Glucose-Capillary: 126 mg/dL — ABNORMAL HIGH (ref 70–99)

## 2021-04-14 MED ORDER — IBUPROFEN 800 MG PO TABS: 800.0000 mg | ORAL_TABLET | Freq: Three times a day (TID) | ORAL | 1 refills | Status: DC | PRN

## 2021-04-14 MED ORDER — METHOCARBAMOL 500 MG PO TABS: 500.0000 mg | ORAL_TABLET | Freq: Four times a day (QID) | ORAL | 1 refills | Status: DC | PRN

## 2021-04-14 MED ORDER — OXYCODONE HCL 5 MG PO TABS: 5.0000 mg | ORAL_TABLET | ORAL | 0 refills | Status: DC | PRN

## 2021-04-14 MED ORDER — AMOXICILLIN-POT CLAVULANATE 875-125 MG PO TABS: 1.0000 | ORAL_TABLET | Freq: Two times a day (BID) | ORAL | 0 refills | Status: AC

## 2021-04-14 MED ORDER — SODIUM CHLORIDE 0.9% FLUSH: 5.0000 mL | Freq: Every day | INTRAVENOUS | 0 refills | Status: DC

## 2021-04-14 MED ORDER — PREGABALIN 50 MG PO CAPS: 50.0000 mg | ORAL_CAPSULE | Freq: Three times a day (TID) | ORAL | 1 refills | Status: DC

## 2021-04-14 NOTE — Care Management Important Message (Signed)
Important Message ? ?Patient Details  ?Name: Stacy Cox ?MRN: [id number] ?Date of Birth: 1955/06/15 ? ? ?Medicare Important Message Given:  Yes ? ?Reviewed Medicare IM with patient via room phone, [phone], due to isolation status.  Copy of Medicare IM to be delivered to patient via nursing staff.  ? ? ?Stacy Cox ?04/14/2021, 10:45 AM ?

## 2021-04-14 NOTE — Discharge Summary (Signed)
Patient ID: ?Stacy Cox ?MRN: [id number] ?DOB/AGE: May 23, 1955 66 y.o. ? ?Admit date: 04/08/2021 ?Discharge date: 04/14/2021 ? ? ?Discharge Diagnoses:  ?Principal Problem: ?  Abdominal pain ?Active Problems: ?  Postoperative intra-abdominal abscess ? ? ?Procedures:  Repositioning of left gluteal drain ? ?Hospital Course: Patient was admitted on 04/08/2021 directly from the office by Dr. Dahlia Byes due to worsening pain in the lower abdomen.  She had a CT scan on admission which showed that the pelvic drain going through the left gluteal area was not well positioned and there was an area of abscess related to the anastomotic leak the patient has.  Interventional radiology was consulted and they were able to reposition the drain.  The patient reported remarkable improvement right after repositioning of the drain.  After procedure, the patient's diet was advanced slowly and she was tolerating well.  Her medication regimen was converted to po and she was having good pain control.  Her drain remains with purulent/feculent material, but very low output.  She will be discharged home today with po pain medications, po antibiotic, and NS flushes for her drain.  Follow up with Dr. Dahlia Byes in a week. ? ?Consults: Interventional radiology ? ?Disposition: Discharge disposition: 01-Home or Self Care ? ? ? ? ? ? ?Discharge Instructions   ? ? Call MD for:  difficulty breathing, headache or visual disturbances   Complete by: As directed ?  ? Call MD for:  persistant nausea and vomiting   Complete by: As directed ?  ? Call MD for:  redness, tenderness, or signs of infection (pain, swelling, redness, odor or green/yellow discharge around incision site)   Complete by: As directed ?  ? Call MD for:  severe uncontrolled pain   Complete by: As directed ?  ? Call MD for:  temperature >100.4   Complete by: As directed ?  ? Diet - low sodium heart healthy   Complete by: As directed ?  ? Discharge instructions   Complete by: As directed ?  ? 1.  Patient  may shower, but do not scrub wounds heavily and dab dry only. ?2.  Do not submerge wounds or drains in pool/tub until fully healed/removed. ?3.  Dressing:  Apply dry gauze dressing over/around the drains once daily. ?4.  Please flush the left buttocks drain with 5 ml of NS once daily. ?5.  Please record the drain output from each drain daily and bring record with you to office appointment.  ? Driving Restrictions   Complete by: As directed ?  ? Do not drive while taking narcotics for pain control.  Prior to driving, make sure you are able to rotate right and left to look at blindspots without significant pain or discomfort.  ? Increase activity slowly   Complete by: As directed ?  ? Lifting restrictions   Complete by: As directed ?  ? No heavy lifting or pushing of more than 10-15 lbs for 4 weeks.  ? ?  ? ?Allergies as of 04/14/2021   ?No Known Allergies ?  ? ?  ?Medication List  ?  ? ?STOP taking these medications   ? ?ciprofloxacin 500 MG tablet ?Commonly known as: CIPRO ?  ?gabapentin 100 MG capsule ?Commonly known as: NEURONTIN ?  ?oxyCODONE-acetaminophen 5-325 MG tablet ?Commonly known as: Percocet ?  ? ?  ? ?TAKE these medications   ? ?amiodarone 200 MG tablet ?Commonly known as: PACERONE ?Take 1 tablet (200 mg total) by mouth daily. ?  ?amoxicillin-clavulanate 875-125 MG tablet ?Commonly  known as: Augmentin ?Take 1 tablet by mouth 2 (two) times daily for 10 days. ?  ?apixaban 5 MG Tabs tablet ?Commonly known as: ELIQUIS ?Take 1 tablet (5 mg total) by mouth 2 (two) times daily. ?  ?carvedilol 20 MG 24 hr capsule ?Commonly known as: COREG CR ?Take 20 mg by mouth daily. ?  ?diclofenac Sodium 1 % Gel ?Commonly known as: VOLTAREN ?Apply 2 g topically daily as needed (joint pain). ?  ?furosemide 40 MG tablet ?Commonly known as: LASIX ?Take 40 mg by mouth daily. ?  ?ibuprofen 800 MG tablet ?Commonly known as: ADVIL ?Take 1 tablet (800 mg total) by mouth every 8 (eight) hours as needed for moderate pain. ?   ?loperamide 2 MG capsule ?Commonly known as: IMODIUM ?Take 1 capsule (2 mg total) by mouth 2 (two) times daily. ?  ?metFORMIN 500 MG tablet ?Commonly known as: GLUCOPHAGE ?Take 500 mg by mouth daily with breakfast. ?  ?methocarbamol 500 MG tablet ?Commonly known as: ROBAXIN ?Take 1 tablet (500 mg total) by mouth every 6 (six) hours as needed for muscle spasms. ?  ?MULTIVITAL PO ?Take 1 tablet by mouth daily. ?  ?nicotine 21 mg/24hr patch ?Commonly known as: Nicoderm CQ ?Place 1 patch (21 mg total) onto the skin daily. ?  ?oxyCODONE 5 MG immediate release tablet ?Commonly known as: Oxy IR/ROXICODONE ?Take 1 tablet (5 mg total) by mouth every 4 (four) hours as needed for severe pain. ?What changed: reasons to take this ?  ?pregabalin 50 MG capsule ?Commonly known as: LYRICA ?Take 1 capsule (50 mg total) by mouth 3 (three) times daily. ?What changed:  ?medication strength ?how much to take ?  ?sodium chloride flush 0.9 % Soln ?Commonly known as: NS ?5 mLs by Intracatheter route daily. Flush left gluteal drain daily with 5 ccs of NS ?What changed: how to take this ?  ?vitamin B-12 1000 MCG tablet ?Commonly known as: CYANOCOBALAMIN ?Take 1,000 mcg by mouth daily. ?  ?vitamin C 1000 MG tablet ?Take 1,000 mg by mouth daily. ?  ?Vitamin D 50 MCG (2000 UT) Caps ?Take 2,000 Units by mouth daily. ?  ? ?  ? ? Follow-up Information   ? ? [name], [name], MD Follow up in 1 week(s).   ?Specialty: General Surgery ?Contact information: ?[address] ?[address] ?[place] [address] ?[phone] ? ? ?  ?  ? ?  ?  ? ?  ? ? ? ?

## 2021-04-20 ENCOUNTER — Other Ambulatory Visit: Payer: Self-pay

## 2021-04-20 ENCOUNTER — Ambulatory Visit (INDEPENDENT_AMBULATORY_CARE_PROVIDER_SITE_OTHER): Payer: Medicare HMO | Admitting: Surgery

## 2021-04-20 ENCOUNTER — Encounter: Payer: Self-pay | Admitting: Surgery

## 2021-04-20 VITALS — BP 102/69 | HR 80 | Temp 97.9°F | Ht 68.0 in | Wt 227.0 lb

## 2021-04-20 DIAGNOSIS — Z09 Encounter for follow-up examination after completed treatment for conditions other than malignant neoplasm: Secondary | ICD-10-CM

## 2021-04-20 DIAGNOSIS — T8143XA Infection following a procedure, organ and space surgical site, initial encounter: Secondary | ICD-10-CM

## 2021-04-20 DIAGNOSIS — K5732 Diverticulitis of large intestine without perforation or abscess without bleeding: Secondary | ICD-10-CM

## 2021-04-20 NOTE — Patient Instructions (Addendum)
CT scheduled @ 9:30 04/27/21 @ [facility]. Nothing to eat/drink 4 hours prior. Please go today and pick up the prep kit and instructions for the contrast.  ?  ? ? ?Please see your follow up appointment listed below. ?

## 2021-04-21 NOTE — Progress Notes (Signed)
Mrs. Fedder is 7 weeks out from [facility]  for diverticulitis and abscess.  She developed an anastomotic leak that I was able to be managed conservatively with a drain and antibiotic therapy.   ?Recent admission to reposition to drain uncontrolled pain. ?She is much better today.  She has less than 1 ounce of feculent drainage from the transgluteal drain.  She also has less than 10 cc a day from abdominal JP.  No fevers no chills eating diet and having bowel movements.  ? ? ?PE NAD, alert, non toxic ?Abd: soft, nt, JP drain removed. ( I placed 8 cc lidocaine 1% w epi for pt comfort)  No peritonitis ?Transgluteal drain in place w some feculent material ? 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 ? ?

## 2021-04-27 ENCOUNTER — Ambulatory Visit: Payer: Medicare HMO

## 2021-04-28 ENCOUNTER — Telehealth: Payer: Self-pay

## 2021-04-28 ENCOUNTER — Other Ambulatory Visit: Payer: Self-pay

## 2021-04-28 ENCOUNTER — Ambulatory Visit
Admission: RE | Admit: 2021-04-28 | Discharge: 2021-04-28 | Disposition: A | Discharge status: Home / Self Care | Payer: Medicare HMO | Source: Ambulatory Visit | Attending: Surgery | Admitting: Surgery

## 2021-04-28 DIAGNOSIS — R1084 Generalized abdominal pain: Secondary | ICD-10-CM | POA: Insufficient documentation

## 2021-04-28 DIAGNOSIS — T8143XA Infection following a procedure, organ and space surgical site, initial encounter: Secondary | ICD-10-CM | POA: Diagnosis not present

## 2021-04-28 MED ORDER — IOHEXOL 300 MG/ML  SOLN
100.0000 mL | Freq: Once | INTRAMUSCULAR | Status: AC | PRN
  Administered 2021-04-28: 100 mL via INTRAVENOUS

## 2021-04-28 NOTE — Telephone Encounter (Signed)
Spoke with the patient about rescheduling her CT scan. The hospital can do her scan today. She will arrive at 10:15 am and have nothing to eat or drink before she leaves to go there. She will follow up with Dr Dahlia Byes on Monday. The scan has been authorized.  ?

## 2021-04-29 ENCOUNTER — Ambulatory Visit: Payer: Medicare HMO

## 2021-04-29 ENCOUNTER — Encounter: Payer: Medicare HMO | Admitting: Surgery

## 2021-05-04 ENCOUNTER — Ambulatory Visit (INDEPENDENT_AMBULATORY_CARE_PROVIDER_SITE_OTHER): Payer: Medicare HMO | Admitting: Surgery

## 2021-05-04 ENCOUNTER — Encounter: Payer: Self-pay | Admitting: Surgery

## 2021-05-04 VITALS — BP 113/78 | HR 94 | Temp 98.3°F | Ht 68.0 in | Wt 224.0 lb

## 2021-05-04 DIAGNOSIS — Z09 Encounter for follow-up examination after completed treatment for conditions other than malignant neoplasm: Secondary | ICD-10-CM

## 2021-05-04 DIAGNOSIS — T8143XA Infection following a procedure, organ and space surgical site, initial encounter: Secondary | ICD-10-CM

## 2021-05-04 DIAGNOSIS — K5732 Diverticulitis of large intestine without perforation or abscess without bleeding: Secondary | ICD-10-CM

## 2021-05-04 MED ORDER — PREGABALIN 50 MG PO CAPS: 50.0000 mg | ORAL_CAPSULE | Freq: Three times a day (TID) | ORAL | 1 refills | Status: DC

## 2021-05-04 MED ORDER — OXYCODONE HCL 5 MG PO TABS
5.0000 mg | ORAL_TABLET | Freq: Four times a day (QID) | ORAL | 0 refills | Status: DC | PRN
Start: 2021-05-04 — End: 2021-05-20

## 2021-05-04 NOTE — Progress Notes (Signed)
Valetta is 9 weeks out after robotic assisted low anterior resection with a contained leak managed with drain.  She did have catheter exchange 4 weeks ago more recent we repeated a CT scan showing evidence of the catheter now intraluminally. ?There was no evidence of any further collections or contrast leak ?He is clinically doing very well.  No fevers no chills ?She has some pain ?There is now 0 output from the drain.  She is tolerating p.o. and she is having bowel movements ? ?PE NAD ?Abdomen: Soft no peritonitis.  Incisions healed.  There is very minimal tenderness to palpation where prior drain site was on the right lower quadrant.  No collections. ?There is a left transgluteal drain in place no evidence of abscess ? ?A/P anastomotic leak managed by drain out the drain is intraluminally.  She was very frustrated because she thought that the drain was going to come out today.  I wanted to wait at least 2 more weeks before pulling it out. ?I will like to do a fistulogram to make sure there is no contrast leak before pulling the drain out.  I would like to make sure that there drain forms I could track before removing the drain. ?Currently no need for hospitalization or surgical intervention at this time.  I have referred her Percocet and Lyrica only for a few days ( #15). ?I will see her back next week ?

## 2021-05-04 NOTE — Patient Instructions (Addendum)
Please see your follow up appointment listed below. I will call you later today to let you know when the drain study will be.  ?

## 2021-05-11 ENCOUNTER — Encounter: Payer: Medicare HMO | Admitting: Surgery

## 2021-05-14 ENCOUNTER — Other Ambulatory Visit: Payer: Self-pay | Admitting: Internal Medicine

## 2021-05-15 ENCOUNTER — Other Ambulatory Visit: Payer: Self-pay | Admitting: Interventional Radiology

## 2021-05-15 ENCOUNTER — Ambulatory Visit
Admission: RE | Admit: 2021-05-15 | Discharge: 2021-05-15 | Disposition: A | Discharge status: Home / Self Care | Payer: Medicare HMO | Source: Ambulatory Visit | Attending: Surgery | Admitting: Surgery

## 2021-05-15 ENCOUNTER — Other Ambulatory Visit: Payer: Self-pay | Admitting: Surgery

## 2021-05-15 DIAGNOSIS — T8143XA Infection following a procedure, organ and space surgical site, initial encounter: Secondary | ICD-10-CM

## 2021-05-15 DIAGNOSIS — Y828 Other medical devices associated with adverse incidents: Secondary | ICD-10-CM | POA: Insufficient documentation

## 2021-05-15 DIAGNOSIS — Z4803 Encounter for change or removal of drains: Secondary | ICD-10-CM | POA: Insufficient documentation

## 2021-05-15 HISTORY — PX: IR CATHETER TUBE CHANGE: IMG717

## 2021-05-15 MED ORDER — LIDOCAINE HCL 1 % IJ SOLN
INTRAMUSCULAR | Status: AC
  Administered 2021-05-15: 1 mL
  Filled 2021-05-15: qty 20

## 2021-05-15 MED ORDER — IOHEXOL 350 MG/ML SOLN
15.0000 mL | Freq: Once | INTRAVENOUS | Status: AC | PRN
  Administered 2021-05-15: 15 mL
  Filled 2021-05-15: qty 15

## 2021-05-20 ENCOUNTER — Encounter: Payer: Self-pay | Admitting: Surgery

## 2021-05-20 ENCOUNTER — Ambulatory Visit (INDEPENDENT_AMBULATORY_CARE_PROVIDER_SITE_OTHER): Payer: Medicare HMO | Admitting: Surgery

## 2021-05-20 DIAGNOSIS — T8143XA Infection following a procedure, organ and space surgical site, initial encounter: Secondary | ICD-10-CM

## 2021-05-20 DIAGNOSIS — Z09 Encounter for follow-up examination after completed treatment for conditions other than malignant neoplasm: Secondary | ICD-10-CM

## 2021-05-20 DIAGNOSIS — K5732 Diverticulitis of large intestine without perforation or abscess without bleeding: Secondary | ICD-10-CM

## 2021-05-20 DIAGNOSIS — T8143XD Infection following a procedure, organ and space surgical site, subsequent encounter: Secondary | ICD-10-CM

## 2021-05-20 MED ORDER — OXYCODONE HCL 5 MG PO TABS: 5.0000 mg | ORAL_TABLET | Freq: Four times a day (QID) | ORAL | 0 refills | Status: DC | PRN

## 2021-05-20 MED ORDER — PREGABALIN 50 MG PO CAPS: 50.0000 mg | ORAL_CAPSULE | Freq: Three times a day (TID) | ORAL | 1 refills | Status: DC

## 2021-05-20 MED ORDER — METHOCARBAMOL 500 MG PO TABS: 500.0000 mg | ORAL_TABLET | Freq: Four times a day (QID) | ORAL | 1 refills | Status: DC | PRN

## 2021-05-20 MED ORDER — IBUPROFEN 800 MG PO TABS: 800.0000 mg | ORAL_TABLET | Freq: Three times a day (TID) | ORAL | 1 refills | Status: DC | PRN

## 2021-05-20 NOTE — Patient Instructions (Addendum)
We have left a message for IR to call back about your suture for your drain. They may need to check your drain early.  ? ?We have refilled your muscle relaxer and your Ibuprofen.  ? ?We have also refilled your pain medication and Lyrica.  ? ? ?

## 2021-05-21 NOTE — Progress Notes (Signed)
Mrs. Strieter is 12 weeks out from [place]  for diverticulitis and abscess.  She developed an anastomotic leak that I was able to be managed conservatively with a drain and antibiotic therapy.   ? She has less than 1 ounce of serous drainage from the transgluteal drain.  ?The stitch has dislodged from the skin  ?  ?PE NAD, alert, non toxic afebrile HR 80 ?Abd: soft, nt, No peritonitis, no infection ?Transgluteal drain in place w some serous output, the prolene stitch is not attach to the skin. I did a "mesentery" tape to secure the drain and prevent further migration. ? ?  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  ?I think we have a chance of avoiding diversion as she is not toxic ?Still having pain and needing narcotics due to fistula and drain ?NO need for surgical intervention or hospitalization at this time ?  ?

## 2021-05-22 ENCOUNTER — Ambulatory Visit
Admission: RE | Admit: 2021-05-22 | Discharge: 2021-05-22 | Disposition: A | Discharge status: Home / Self Care | Payer: Medicare HMO | Source: Ambulatory Visit | Attending: Interventional Radiology | Admitting: Interventional Radiology

## 2021-05-22 ENCOUNTER — Other Ambulatory Visit: Payer: Self-pay | Admitting: Interventional Radiology

## 2021-05-22 DIAGNOSIS — Z9049 Acquired absence of other specified parts of digestive tract: Secondary | ICD-10-CM | POA: Diagnosis present

## 2021-05-22 DIAGNOSIS — Z4803 Encounter for change or removal of drains: Secondary | ICD-10-CM | POA: Diagnosis not present

## 2021-05-22 DIAGNOSIS — T8143XA Infection following a procedure, organ and space surgical site, initial encounter: Secondary | ICD-10-CM

## 2021-05-22 HISTORY — PX: IR CATHETER TUBE CHANGE: IMG717

## 2021-05-22 MED ORDER — LIDOCAINE HCL 1 % IJ SOLN
INTRAMUSCULAR | Status: AC
  Administered 2021-05-22: 1 mL
  Filled 2021-05-22: qty 20

## 2021-05-22 MED ORDER — IOHEXOL 350 MG/ML SOLN
10.0000 mL | Freq: Once | INTRAVENOUS | Status: AC | PRN
  Administered 2021-05-22: 10 mL
  Filled 2021-05-22: qty 10

## 2021-05-25 ENCOUNTER — Encounter: Payer: Self-pay | Admitting: Surgery

## 2021-05-25 ENCOUNTER — Ambulatory Visit (INDEPENDENT_AMBULATORY_CARE_PROVIDER_SITE_OTHER): Payer: Medicare HMO | Admitting: Surgery

## 2021-05-25 VITALS — BP 113/74 | HR 99 | Temp 97.9°F | Ht 68.0 in | Wt 229.6 lb

## 2021-05-25 DIAGNOSIS — Z09 Encounter for follow-up examination after completed treatment for conditions other than malignant neoplasm: Secondary | ICD-10-CM

## 2021-05-25 DIAGNOSIS — K5732 Diverticulitis of large intestine without perforation or abscess without bleeding: Secondary | ICD-10-CM

## 2021-05-25 DIAGNOSIS — Z9049 Acquired absence of other specified parts of digestive tract: Secondary | ICD-10-CM

## 2021-05-25 NOTE — Patient Instructions (Addendum)
A referral has been placed to [facility] (Dr. Jackquline Denmark). They will call you with an appointment.  ? ? ?If you have any concerns or questions, please feel free to call our office.  ?

## 2021-05-25 NOTE — Progress Notes (Signed)
Mrs. Pick is 3 months out from [place] ( 02/27/2020)  for diverticulitis and abscess.  She developed an anastomotic leak that I was able to managed conservatively with a drain and antibiotic therapy.   ?She had the drain repositioned last week by IR. Initially intraluminal, drain was repositioned again last week as stitch fell off. Drain is exiting from the transgluteal left area.  ?After the latest repositioning the patient feels better.  There is a scant amount of drainage that is somewhat feculent.  She reports no fevers no chills she is eating regular diet.  She is having bowel movements. ?  ?PE NAD, alert, non toxic afebrile HR 80 ?Abd: soft, nt, No peritonitis, no infection ?Transgluteal drain in place w some serous output, the prolene stitch is not attach to the skin. I did a "mesentery" tape to secure the drain and prevent further migration. ?  ?  ?A/p Drain now on the appropriate area draining small cavity. ?IR to interrogate drain again may 5th, I will see her back on May 8th. ?I have discussed the case in detail with Dr. Rush Landmark . HE reviewed the images . There was a bout a 1 cm defect within the colon. This might be amenable to endoscopic stitching. We will refr to advance endoscopy group at [facility] ?If attempts at containing fistula failed we may need to do loop ileostomy.  ?I think we have a chance of avoiding diversion as she is not toxic ?NO need for surgical intervention or hospitalization at this time ?  ?

## 2021-05-28 ENCOUNTER — Other Ambulatory Visit: Payer: Self-pay | Admitting: Interventional Radiology

## 2021-05-28 DIAGNOSIS — K651 Peritoneal abscess: Secondary | ICD-10-CM

## 2021-05-29 ENCOUNTER — Ambulatory Visit: Payer: Medicare HMO | Admitting: Radiology

## 2021-06-05 ENCOUNTER — Ambulatory Visit
Admission: RE | Admit: 2021-06-05 | Discharge: 2021-06-05 | Disposition: A | Discharge status: Home / Self Care | Payer: Medicare HMO | Source: Ambulatory Visit | Attending: Interventional Radiology | Admitting: Interventional Radiology

## 2021-06-05 ENCOUNTER — Other Ambulatory Visit: Payer: Self-pay | Admitting: Interventional Radiology

## 2021-06-05 ENCOUNTER — Ambulatory Visit
Admission: RE | Admit: 2021-06-05 | Discharge: 2021-06-05 | Disposition: A | Discharge status: Home / Self Care | Payer: Medicare HMO | Source: Ambulatory Visit | Attending: Diagnostic Radiology | Admitting: Diagnostic Radiology

## 2021-06-05 DIAGNOSIS — Z9049 Acquired absence of other specified parts of digestive tract: Secondary | ICD-10-CM | POA: Insufficient documentation

## 2021-06-05 DIAGNOSIS — K651 Peritoneal abscess: Secondary | ICD-10-CM

## 2021-06-05 DIAGNOSIS — T8143XA Infection following a procedure, organ and space surgical site, initial encounter: Secondary | ICD-10-CM

## 2021-06-05 DIAGNOSIS — Y838 Other surgical procedures as the cause of abnormal reaction of the patient, or of later complication, without mention of misadventure at the time of the procedure: Secondary | ICD-10-CM | POA: Insufficient documentation

## 2021-06-05 DIAGNOSIS — Z4803 Encounter for change or removal of drains: Secondary | ICD-10-CM | POA: Diagnosis not present

## 2021-06-05 HISTORY — PX: IR SINUS/FIST TUBE CHK-NON GI: IMG673

## 2021-06-05 MED ORDER — IOHEXOL 350 MG/ML SOLN
5.0000 mL | Freq: Once | INTRAVENOUS | Status: AC | PRN
  Administered 2021-06-05: 5 mL
  Filled 2021-06-05: qty 5

## 2021-06-05 MED ORDER — LIDOCAINE HCL 1 % IJ SOLN
INTRAMUSCULAR | Status: AC
  Filled 2021-06-05: qty 20

## 2021-06-08 ENCOUNTER — Ambulatory Visit (INDEPENDENT_AMBULATORY_CARE_PROVIDER_SITE_OTHER): Payer: Medicare HMO | Admitting: Surgery

## 2021-06-08 ENCOUNTER — Encounter: Payer: Self-pay | Admitting: Surgery

## 2021-06-08 VITALS — BP 106/69 | HR 78 | Temp 97.8°F | Wt 229.4 lb

## 2021-06-08 DIAGNOSIS — K9189 Other postprocedural complications and disorders of digestive system: Secondary | ICD-10-CM

## 2021-06-08 DIAGNOSIS — K5732 Diverticulitis of large intestine without perforation or abscess without bleeding: Secondary | ICD-10-CM | POA: Diagnosis not present

## 2021-06-08 NOTE — Patient Instructions (Signed)
Follow up here in 5 weeks.  ?

## 2021-06-10 NOTE — Progress Notes (Signed)
Stacy Cox is 3 1/2 months out from [facility] ( 02/27/2020)  for diverticulitis and abscess.  She developed an anastomotic leak that I was able to managed conservatively with a drain and antibiotic therapy.   ?She had the drain interrogated last week by IR. Drain still intraluminal,. I have pers. Reviewed the images.  ? Drain is exiting from the transgluteal left area.  ? There is a scant amount of drainage that is somewhat feculent.  She reports no fevers no chills she is eating regular diet.  She is having bowel movements. ?  ?PE NAD, alert, non toxic afebrile ?Abd: soft, nt, No peritonitis, no infection ?Transgluteal drain in place w some scant feculent output,   ?  ?A/p  ?Small leak and drain intraluminal. Awaiting advance endoscopy evaluation by [facility] ?If attempts at containing fistula failed we may need to do loop ileostomy.  ?I think we have a chance of avoiding diversion as she is not toxic ?NO need for surgical intervention or hospitalization at this time ?  ?

## 2021-06-11 ENCOUNTER — Other Ambulatory Visit: Payer: Self-pay | Admitting: Interventional Radiology

## 2021-06-11 DIAGNOSIS — T8143XA Infection following a procedure, organ and space surgical site, initial encounter: Secondary | ICD-10-CM

## 2021-06-19 ENCOUNTER — Other Ambulatory Visit: Payer: Self-pay | Admitting: Family Medicine

## 2021-06-19 DIAGNOSIS — Z1231 Encounter for screening mammogram for malignant neoplasm of breast: Secondary | ICD-10-CM

## 2021-06-19 DIAGNOSIS — N63 Unspecified lump in unspecified breast: Secondary | ICD-10-CM

## 2021-06-22 ENCOUNTER — Telehealth: Payer: Self-pay

## 2021-06-22 ENCOUNTER — Encounter: Payer: Self-pay | Admitting: Surgery

## 2021-06-22 ENCOUNTER — Ambulatory Visit: Payer: Medicare HMO | Admitting: Surgery

## 2021-06-22 VITALS — BP 111/74 | HR 98 | Temp 98.7°F | Wt 230.4 lb

## 2021-06-22 DIAGNOSIS — F17210 Nicotine dependence, cigarettes, uncomplicated: Secondary | ICD-10-CM

## 2021-06-22 DIAGNOSIS — T8143XA Infection following a procedure, organ and space surgical site, initial encounter: Secondary | ICD-10-CM | POA: Diagnosis not present

## 2021-06-22 DIAGNOSIS — Z9049 Acquired absence of other specified parts of digestive tract: Secondary | ICD-10-CM | POA: Diagnosis not present

## 2021-06-22 MED ORDER — PREGABALIN 50 MG PO CAPS: 50.0000 mg | ORAL_CAPSULE | Freq: Three times a day (TID) | ORAL | 1 refills | Status: DC

## 2021-06-22 MED ORDER — IBUPROFEN 800 MG PO TABS: 800.0000 mg | ORAL_TABLET | Freq: Three times a day (TID) | ORAL | 1 refills | Status: DC | PRN

## 2021-06-22 MED ORDER — OXYCODONE HCL 5 MG PO TABS: 5.0000 mg | ORAL_TABLET | Freq: Four times a day (QID) | ORAL | 0 refills | Status: DC | PRN

## 2021-06-22 MED ORDER — METHOCARBAMOL 500 MG PO TABS: 500.0000 mg | ORAL_TABLET | Freq: Four times a day (QID) | ORAL | 1 refills | Status: AC | PRN

## 2021-06-22 NOTE — Patient Instructions (Addendum)
I will call you with an appointment with radiology. If you have not heard anything by Wednesday, please call our office.    If you have any concerns or questions, please feel free to call our office.   Minimally Invasive Total Colectomy, Care After This sheet gives you information about how to care for yourself after your procedure. Your health care provider may also give you more specific instructions. If you have problems or questions, contact your health care provider. What can I expect after the procedure? After your procedure, it is common to have the following: Pain in your abdomen, especially in the incision areas. You will be given medicine to control the pain. Tiredness. This is a normal part of the recovery process. Your energy level will return to normal over the next several weeks. Changes in your bowel movements, especially having bowel movements more often. Talk with your health care provider about how to manage this. Follow these instructions at home: Medicines Take over-the-counter and prescription medicines only as told by your health care provider. If you were prescribed an antibiotic medicine, take it as told by your health care provider. Do not stop using the antibiotic even if you start to feel better. Ask your health care provider if the medicine prescribed to you requires you to avoid driving or using machinery. Eating and drinking Follow instructions from your health care provider about what you can eat after surgery. You may be asked to begin with a diet that is low in fiber. Do not drink alcohol if: Your health care provider tells you not to drink. You are pregnant, may be pregnant, or are planning to become pregnant. If you drink alcohol: Limit how much you use to: 0-1 drink a day for women. 0-2 drinks a day for men. Be aware of how much alcohol is in your drink. In the [place] one drink equals one 12 oz bottle of beer (355 mL), one 5 oz glass of wine (148 mL), or  one 1 oz glass of hard liquor (44 mL). Incision care  Follow instructions from your health care provider about how to take care of your incisions. Make sure you: Wash your hands with soap and water for at least 20 seconds before and after applying medicine to the area, and before and after changing your bandage (dressing). If soap and water are not available, use hand sanitizer. Change your dressing as told by your health care provider. Leave stitches (sutures) or staples in place. These skin closures may need to stay in place for 2 weeks or longer. If adhesive strip edges start to loosen and curl up, you may trim the loose edges. Do not remove adhesive strips completely unless your health care provider tells you to do that. Keep your incisions clean and dry. Do not wear tight clothing over the incisions. Tight clothing may rub and irritate the incision areas, which may cause the incisions to open. Do not take baths, swim, or use a hot tub until your health care provider approves. Ask your health care provider if you may take showers. You may only be allowed to take sponge baths. Check your incision area every day for signs of infection. Check for: More redness, swelling, or pain. Fluid or blood. Warmth. Pus or a bad smell. Activity Rest as told by your health care provider. Avoid sitting for a long time without moving. Get up to take short walks every 1-2 hours. This is important to improve blood flow and breathing. Ask for  help if you feel weak or unsteady. Do not lift anything that is heavier than 10 lb (4.5 kg), or the limit that you are told, until your health care provider says that it is safe. Return to your normal activities as told by your health care provider. Ask your health care provider what activities are safe for you. General instructions Keep all follow-up visits as told by your health care provider. This is important. You may need a follow-up visit to remove sutures or  staples. Contact a health care provider if: You have more redness, swelling, or pain around your incisions. You have fluid or blood coming from the incisions. Your incisions feel warm to the touch. You have pus or a bad smell coming from your incisions or your dressing. You have a fever. Your incisions break open after sutures or staples have been removed. Get help right away if: You develop a rash. You have chest pain or difficulty breathing. You have pain or swelling in your legs. You feel light-headed or you faint. Your abdomen swells (becomes distended). You have nausea or vomiting. You have blood in your stool. Summary After the procedure, it is common to have pain in the abdomen, tiredness, or changes in bowel movements. Follow instructions from your health care provider about how to care for your incisions. Begin your diet with low-fiber foods. Your health care provider will tell you when to resume your regular diet. Contact a health care provider if you have any signs of infection, such as a fever, more redness, swelling, or pain around your incisions, or a bad smell coming from your incisions. Get help right away if you have a rash, nausea, chest pain, pain or swelling in your legs, or blood in your stool. This information is not intended to replace advice given to you by your health care provider. Make sure you discuss any questions you have with your health care provider. Document Revised: 01/09/2021 Document Reviewed: 12/27/2018 Elsevier Patient Education  [place].

## 2021-06-22 NOTE — Telephone Encounter (Signed)
Spoke with IR department and pt is scheduled for 06/23/21. LVM for pt with the information.

## 2021-06-23 ENCOUNTER — Ambulatory Visit
Admission: RE | Admit: 2021-06-23 | Discharge: 2021-06-23 | Disposition: A | Discharge status: Home / Self Care | Payer: Medicare HMO | Source: Ambulatory Visit | Attending: Surgery | Admitting: Surgery

## 2021-06-23 ENCOUNTER — Other Ambulatory Visit: Payer: Self-pay | Admitting: Surgery

## 2021-06-23 DIAGNOSIS — Z9049 Acquired absence of other specified parts of digestive tract: Secondary | ICD-10-CM | POA: Diagnosis present

## 2021-06-23 DIAGNOSIS — T8183XA Persistent postprocedural fistula, initial encounter: Secondary | ICD-10-CM | POA: Diagnosis not present

## 2021-06-23 DIAGNOSIS — Y832 Surgical operation with anastomosis, bypass or graft as the cause of abnormal reaction of the patient, or of later complication, without mention of misadventure at the time of the procedure: Secondary | ICD-10-CM | POA: Insufficient documentation

## 2021-06-23 DIAGNOSIS — K9409 Other complications of colostomy: Secondary | ICD-10-CM | POA: Diagnosis not present

## 2021-06-23 HISTORY — PX: IR SINUS/FIST TUBE CHK-NON GI: IMG673

## 2021-06-23 MED ORDER — LIDOCAINE HCL 1 % IJ SOLN
INTRAMUSCULAR | Status: AC
  Administered 2021-06-23: 7 mL via SUBCUTANEOUS
  Filled 2021-06-23: qty 20

## 2021-06-23 MED ORDER — IOHEXOL 350 MG/ML SOLN
5.0000 mL | Freq: Once | INTRAVENOUS | Status: AC | PRN
  Administered 2021-06-23: 5 mL

## 2021-06-24 ENCOUNTER — Encounter: Payer: Self-pay | Admitting: Surgery

## 2021-06-24 NOTE — Progress Notes (Signed)
Outpatient Surgical Follow Up  06/24/2021  Stacy Cox is an 66 y.o. female.   Chief Complaint  Patient presents with   Follow-up    --ROBOT ASSISTED SIGMOID COLECTOMY     HPI: 66 year old female well-known to me with an anastomotic leak that is contained and being controlled by drain.  She now thinks that the drain has dislodged once again.  She continues to have significant gluteal pain where the drain site is.  No fevers no chills she is tolerating diet. He does have an appointment with [facility] in a couple weeks.  Past Medical History:  Diagnosis Date   CHF (congestive heart failure) (HCC)    COPD (chronic obstructive pulmonary disease) (HCC)    Diabetes mellitus, type 2 (HCC)    GERD (gastroesophageal reflux disease)    Heart disease    Hypertension    Joint pain    Sleep apnea    Tobacco abuse     Past Surgical History:  Procedure Laterality Date   COLONOSCOPY  2017   COLONOSCOPY WITH PROPOFOL N/A 01/07/2021   Procedure: COLONOSCOPY WITH PROPOFOL;  Surgeon: [name], MD;  Location: [facility] ENDOSCOPY;  Service: Endoscopy;  Laterality: N/A;   IR CATHETER TUBE CHANGE  11/25/2020   IR CATHETER TUBE CHANGE  11/25/2020   IR CATHETER TUBE CHANGE  03/30/2021   IR CATHETER TUBE CHANGE  04/09/2021   IR CATHETER TUBE CHANGE  05/15/2021   IR CATHETER TUBE CHANGE  05/22/2021   IR IMAGE GUIDED FLUID DRAIN BY CATHETER  03/30/2021   IR RADIOLOGIST EVAL & MGMT  11/19/2020   IR RADIOLOGIST EVAL & MGMT  12/02/2020   IR RADIOLOGIST EVAL & MGMT  12/09/2020   IR SINUS/FIST TUBE CHK-NON GI  06/05/2021   IR SINUS/FIST TUBE CHK-NON GI  06/05/2021   IR SINUS/FIST TUBE CHK-NON GI  06/23/2021    Family History  Problem Relation Age of Onset   Breast cancer Sister        sister #1 age 54   Kidney cancer Sister        sister #1 age -64's   Breast cancer Sister        sister #2 age 58's   Kidney disease Sister        sister #2 age 18's   Diabetes Mother    Heart  disease Mother        + BYPASS SURGERY   Deep vein thrombosis Mother    Arthritis Mother    Diabetes Sister    Heart disease Sister    Hypertension Brother    Heart attack Father     Social History:  reports that she has been smoking cigarettes. She has a 40.00 pack-year smoking history. She has quit using smokeless tobacco.  Her smokeless tobacco use included snuff. She reports that she does not drink alcohol and does not use drugs.  Allergies: No Known Allergies  Medications reviewed.    ROS Full ROS performed and is otherwise negative other than what is stated in HPI   BP 111/74   Pulse 98   Temp 98.7 F (37.1 C) (Oral)   Wt 230 lb 6.4 oz (104.5 kg)   SpO2 97%   BMI 35.03 kg/m   Physical Exam  NAD Abdomen: Soft no peritonitis.  Incisions healed.  There is NO tenderness to palpation .  No collections. There is a left transgluteal drain in place , THE STITCH IS NO LONGER ATTACH TO THE SKIN, SOME SCANT  FECULENT DRAINAGE.  Assessment/Plan: Contained anastomotic leak with a drain.  We will place an order to interrogate once again as the drain has seem to be migrated.  Awaiting final recommendation intervention from advanced endoscopist at [facility].  She continues to have pain due to chronic leak.  Discussed with her in detail about pain management strategies.  I will do a few pills of Percocet to get her through the appointment with advanced endoscopist that at that time hopefully her pain will resolve because likely the drain will come out and most of her pain is related to the drain site  I have spent at least 20 minutes in this encounter including personally reviewing imaging studies, coordinating her care, placing orders and performing appropriate documentation    Caroleen Hamman, MD [place] Surgeon

## 2021-07-07 ENCOUNTER — Ambulatory Visit
Admission: RE | Admit: 2021-07-07 | Discharge: 2021-07-07 | Disposition: A | Discharge status: Home / Self Care | Payer: Medicare HMO | Source: Ambulatory Visit | Attending: Interventional Radiology | Admitting: Interventional Radiology

## 2021-07-07 ENCOUNTER — Other Ambulatory Visit: Payer: Self-pay | Admitting: Interventional Radiology

## 2021-07-07 DIAGNOSIS — K632 Fistula of intestine: Secondary | ICD-10-CM | POA: Diagnosis not present

## 2021-07-07 DIAGNOSIS — L0291 Cutaneous abscess, unspecified: Secondary | ICD-10-CM

## 2021-07-07 DIAGNOSIS — Y839 Surgical procedure, unspecified as the cause of abnormal reaction of the patient, or of later complication, without mention of misadventure at the time of the procedure: Secondary | ICD-10-CM | POA: Diagnosis not present

## 2021-07-07 DIAGNOSIS — T8143XA Infection following a procedure, organ and space surgical site, initial encounter: Secondary | ICD-10-CM | POA: Diagnosis not present

## 2021-07-07 HISTORY — PX: IR CATHETER TUBE CHANGE: IMG717

## 2021-07-07 MED ORDER — IOHEXOL 350 MG/ML SOLN
50.0000 mL | Freq: Once | INTRAVENOUS | Status: AC | PRN
Start: 2021-07-07 — End: 2021-07-07
  Administered 2021-07-07: 10 mL

## 2021-07-07 MED ORDER — LIDOCAINE HCL 1 % IJ SOLN
INTRAMUSCULAR | Status: AC
  Administered 2021-07-07: 8 mL
  Filled 2021-07-07: qty 20

## 2021-07-07 NOTE — Procedures (Signed)
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

## 2021-07-08 ENCOUNTER — Other Ambulatory Visit: Payer: Medicare HMO

## 2021-07-10 ENCOUNTER — Ambulatory Visit: Admission: RE | Admit: 2021-07-10 | Payer: Medicare HMO | Source: Ambulatory Visit | Admitting: Radiology

## 2021-07-22 ENCOUNTER — Ambulatory Visit: Payer: Medicare HMO | Admitting: Surgery

## 2021-07-23 ENCOUNTER — Other Ambulatory Visit: Payer: Medicare HMO

## 2021-07-23 DIAGNOSIS — F17210 Nicotine dependence, cigarettes, uncomplicated: Secondary | ICD-10-CM | POA: Insufficient documentation

## 2021-07-23 DIAGNOSIS — G4733 Obstructive sleep apnea (adult) (pediatric): Secondary | ICD-10-CM | POA: Insufficient documentation

## 2021-08-10 ENCOUNTER — Other Ambulatory Visit: Payer: Self-pay

## 2021-08-10 ENCOUNTER — Other Ambulatory Visit: Payer: Self-pay | Admitting: Surgery

## 2021-08-10 ENCOUNTER — Ambulatory Visit (INDEPENDENT_AMBULATORY_CARE_PROVIDER_SITE_OTHER): Payer: Medicare HMO | Admitting: Surgery

## 2021-08-10 ENCOUNTER — Encounter: Payer: Self-pay | Admitting: Surgery

## 2021-08-10 ENCOUNTER — Telehealth: Payer: Self-pay

## 2021-08-10 VITALS — BP 118/80 | HR 89 | Temp 98.5°F | Ht 68.0 in | Wt 234.2 lb

## 2021-08-10 DIAGNOSIS — T8143XA Infection following a procedure, organ and space surgical site, initial encounter: Secondary | ICD-10-CM

## 2021-08-10 DIAGNOSIS — T8143XD Infection following a procedure, organ and space surgical site, subsequent encounter: Secondary | ICD-10-CM

## 2021-08-10 DIAGNOSIS — Z9049 Acquired absence of other specified parts of digestive tract: Secondary | ICD-10-CM | POA: Diagnosis not present

## 2021-08-10 MED ORDER — IBUPROFEN 800 MG PO TABS: 800.0000 mg | ORAL_TABLET | Freq: Three times a day (TID) | ORAL | 0 refills | Status: DC | PRN

## 2021-08-10 NOTE — Telephone Encounter (Signed)
Left message on patients VM.    Drain check scheduled 08/18/2021 @ 11am [facility].  Dr.[name] 08/24/21 @ 9:45.

## 2021-08-10 NOTE — Patient Instructions (Addendum)
I will call you later and give you the appointment information. Please pick up your medication at the pharmacy.

## 2021-08-13 ENCOUNTER — Encounter: Payer: Self-pay | Admitting: Surgery

## 2021-08-13 NOTE — Progress Notes (Signed)
Stacy Cox is a 66 year old female well-known to me with prior history of complicated diverticulitis requiring robotic low anterior resection complicated by contained anastomotic leak managed with drain placement.  She continues to improve.  More recently I did send her to [facility] where they were able to see a potential small fistula and place an Endo Stitch.  She did well and she still has a drain with minimal scant fluid.  She is tolerating diet no fevers no chills  Physical exam: No acute distress awake and alert Abdomen: Soft nontender no peritonitis. She does have a left gluteal drain with minimal serous fluid no evidence of fecal material.  No evidence of abscess or infection   A/P contained anastomotic leak managed both with drain placement and endoscopic a stitch.  We will perform contrast study via the drain and if there is no evidence of fistula drain can be removed.  She continues to improve.  Return to clinic in a week or 2.  Please note that I spent greater than 20 minutes in this encounter including coordination of her care, counseling the patient and performing appropriate documentation

## 2021-08-17 ENCOUNTER — Other Ambulatory Visit: Payer: Self-pay | Admitting: Student

## 2021-08-18 ENCOUNTER — Other Ambulatory Visit: Payer: Self-pay | Admitting: Interventional Radiology

## 2021-08-18 ENCOUNTER — Ambulatory Visit
Admission: RE | Admit: 2021-08-18 | Discharge: 2021-08-18 | Disposition: A | Discharge status: Home / Self Care | Payer: Medicare HMO | Source: Ambulatory Visit | Attending: Interventional Radiology | Admitting: Interventional Radiology

## 2021-08-18 DIAGNOSIS — L0291 Cutaneous abscess, unspecified: Secondary | ICD-10-CM

## 2021-08-18 HISTORY — PX: IR SINUS/FIST TUBE CHK-NON GI: IMG673

## 2021-08-18 NOTE — Procedures (Signed)
Interventional Radiology Procedure Note  Procedure: FLUORO ABSCESS DRAIN INJECTION AND REMOVAL    Complications: None  Estimated Blood Loss:  0  Findings: DRAIN NEARLY OUT JUST BENEATH THE SKIN NO RESIDUAL DRAIN TRACT OR FISTULA DRAIN REMOVED    M. Daryll Brod, MD

## 2021-08-19 ENCOUNTER — Ambulatory Visit
Admission: RE | Admit: 2021-08-19 | Discharge: 2021-08-19 | Disposition: A | Discharge status: Home / Self Care | Payer: Medicare HMO | Source: Ambulatory Visit | Attending: Family Medicine | Admitting: Family Medicine

## 2021-08-19 DIAGNOSIS — Z1231 Encounter for screening mammogram for malignant neoplasm of breast: Secondary | ICD-10-CM

## 2021-08-19 DIAGNOSIS — N6320 Unspecified lump in the left breast, unspecified quadrant: Secondary | ICD-10-CM | POA: Diagnosis present

## 2021-08-19 DIAGNOSIS — N6325 Unspecified lump in the left breast, overlapping quadrants: Secondary | ICD-10-CM | POA: Diagnosis not present

## 2021-08-19 DIAGNOSIS — N63 Unspecified lump in unspecified breast: Secondary | ICD-10-CM

## 2021-08-24 ENCOUNTER — Ambulatory Visit: Payer: Medicare HMO | Admitting: Surgery

## 2021-08-24 ENCOUNTER — Encounter: Payer: Self-pay | Admitting: Surgery

## 2021-08-24 VITALS — BP 131/78 | HR 101 | Temp 98.3°F | Ht 68.0 in | Wt 236.0 lb

## 2021-08-24 DIAGNOSIS — T8143XD Infection following a procedure, organ and space surgical site, subsequent encounter: Secondary | ICD-10-CM | POA: Diagnosis not present

## 2021-08-24 DIAGNOSIS — T8143XA Infection following a procedure, organ and space surgical site, initial encounter: Secondary | ICD-10-CM

## 2021-08-24 NOTE — Patient Instructions (Signed)
We will call you the end of September to schedule a late October appointment to see Dr.[name]. Please call if you do not hear from our office.

## 2021-08-26 ENCOUNTER — Encounter: Payer: Self-pay | Admitting: Surgery

## 2021-08-26 NOTE — Progress Notes (Signed)
Outpatient Surgical Follow Up  08/26/2021  Stacy Cox is an 66 y.o. female.   Chief Complaint  Patient presents with   Follow-up    HPI: Stacy Cox is a 66 year old female well-known to me with prior history of complicated diverticulitis requiring robotic low anterior resection complicated by contained anastomotic leak managed with drain placement.  She continues to improve.  More recently I did send her to [facility] where they were able to see a potential small fistula and place an Endo Stitch.  She did well and she still has a drain with minimal scant fluid.  She is tolerating diet no fevers no chills. She did have drainage study performed showing no evidence of fistulous tracts or abscess.  Past Medical History:  Diagnosis Date   CHF (congestive heart failure) (HCC)    COPD (chronic obstructive pulmonary disease) (HCC)    Diabetes mellitus, type 2 (HCC)    GERD (gastroesophageal reflux disease)    Heart disease    Hypertension    Joint pain    Sleep apnea    Tobacco abuse     Past Surgical History:  Procedure Laterality Date   COLONOSCOPY  2017   COLONOSCOPY WITH PROPOFOL N/A 01/07/2021   Procedure: COLONOSCOPY WITH PROPOFOL;  Surgeon: [name], MD;  Location: [facility] ENDOSCOPY;  Service: Endoscopy;  Laterality: N/A;   IR CATHETER TUBE CHANGE  11/25/2020   IR CATHETER TUBE CHANGE  11/25/2020   IR CATHETER TUBE CHANGE  03/30/2021   IR CATHETER TUBE CHANGE  04/09/2021   IR CATHETER TUBE CHANGE  05/15/2021   IR CATHETER TUBE CHANGE  05/22/2021   IR CATHETER TUBE CHANGE  07/07/2021   IR IMAGE GUIDED FLUID DRAIN BY CATHETER  03/30/2021   IR RADIOLOGIST EVAL & MGMT  11/19/2020   IR RADIOLOGIST EVAL & MGMT  12/02/2020   IR RADIOLOGIST EVAL & MGMT  12/09/2020   IR SINUS/FIST TUBE CHK-NON GI  06/05/2021   IR SINUS/FIST TUBE CHK-NON GI  06/05/2021   IR SINUS/FIST TUBE CHK-NON GI  06/23/2021   IR SINUS/FIST TUBE CHK-NON GI  08/18/2021    Family History  Problem  Relation Age of Onset   Breast cancer Sister        sister #1 age 16   Kidney cancer Sister        sister #1 age -33's   Breast cancer Sister        sister #2 age 70's   Kidney disease Sister        sister #2 age 42's   Diabetes Mother    Heart disease Mother        + BYPASS SURGERY   Deep vein thrombosis Mother    Arthritis Mother    Diabetes Sister    Heart disease Sister    Hypertension Brother    Heart attack Father     Social History:  reports that she has been smoking cigarettes. She has a 40.00 pack-year smoking history. She has been exposed to tobacco smoke. She has quit using smokeless tobacco.  Her smokeless tobacco use included snuff. She reports that she does not drink alcohol and does not use drugs.  Allergies: No Known Allergies  Medications reviewed.    ROS Full ROS performed and is otherwise negative other than what is stated in HPI   BP 131/78   Pulse (!) 101   Temp 98.3 F (36.8 C)   Ht '5\' 8"'$  (1.727 m)   Wt 236 lb (107  kg)   SpO2 98%   BMI 35.88 kg/m   Physical Exam Vitals and nursing note reviewed. Exam conducted with a chaperone present.  Constitutional:      General: She is not in acute distress.    Appearance: Normal appearance. She is not ill-appearing.  Pulmonary:     Effort: Pulmonary effort is normal. No respiratory distress.     Breath sounds: No stridor.  Abdominal:     General: Abdomen is flat. There is no distension.     Palpations: There is no mass.     Tenderness: There is no abdominal tenderness. There is no guarding.     Hernia: No hernia is present.  Musculoskeletal:        General: Normal range of motion.  Skin:    General: Skin is warm and dry.     Capillary Refill: Capillary refill takes less than 2 seconds.  Neurological:     General: No focal deficit present.     Mental Status: She is alert and oriented to person, place, and time.  Psychiatric:        Cox and Affect: Cox normal.        Behavior: Behavior  normal.        Thought Content: Thought content normal.        Judgment: Judgment normal.      Assessment/Plan: 66 year old female status post anterior resection with contained leak now resolved.  She is doing very well tolerating diet and ambulating and no abdominal pain.  I will see her back at her convenience.  No need for hospitalization or surgical intervention at this time Please note that I spent greater than 30 minutes in this encounter including coordination of her care, counseling the patient and performing appropriate documentation  Caroleen Hamman, MD [place] Surgeon

## 2021-11-18 ENCOUNTER — Encounter: Payer: Self-pay | Admitting: Surgery

## 2021-11-18 ENCOUNTER — Ambulatory Visit (INDEPENDENT_AMBULATORY_CARE_PROVIDER_SITE_OTHER): Payer: Medicare HMO | Admitting: Surgery

## 2021-11-18 VITALS — BP 127/82 | HR 89 | Temp 98.6°F | Ht 68.0 in | Wt 244.4 lb

## 2021-11-18 DIAGNOSIS — T8143XD Infection following a procedure, organ and space surgical site, subsequent encounter: Secondary | ICD-10-CM | POA: Diagnosis not present

## 2021-11-18 DIAGNOSIS — K9189 Other postprocedural complications and disorders of digestive system: Secondary | ICD-10-CM

## 2021-11-18 NOTE — Progress Notes (Signed)
Outpatient Surgical Follow Up  11/18/2021  Stacy Cox is an 66 y.o. female.   Chief Complaint  Patient presents with   Follow-up    Diverticulitis      HPI: Stacy Cox is a 66 year old female well-known to me with prior history of complicated diverticulitis requiring robotic low anterior resection complicated by contained anastomotic leak managed with drain placement  and  Endo Stitch.  She did well  and has no complaints today. She is tolerating diet no fevers no chills. She did have drainage study performed showing no evidence of fistulous tracts or abscess. She continues to smoke  Past Medical History:  Diagnosis Date   CHF (congestive heart failure) (HCC)    COPD (chronic obstructive pulmonary disease) (HCC)    Diabetes mellitus, type 2 (HCC)    GERD (gastroesophageal reflux disease)    Heart disease    Hypertension    Joint pain    Sleep apnea    Tobacco abuse     Past Surgical History:  Procedure Laterality Date   COLONOSCOPY  2017   COLONOSCOPY WITH PROPOFOL N/A 01/07/2021   Procedure: COLONOSCOPY WITH PROPOFOL;  Surgeon: [name], MD;  Location: [facility] ENDOSCOPY;  Service: Endoscopy;  Laterality: N/A;   IR CATHETER TUBE CHANGE  11/25/2020   IR CATHETER TUBE CHANGE  11/25/2020   IR CATHETER TUBE CHANGE  03/30/2021   IR CATHETER TUBE CHANGE  04/09/2021   IR CATHETER TUBE CHANGE  05/15/2021   IR CATHETER TUBE CHANGE  05/22/2021   IR CATHETER TUBE CHANGE  07/07/2021   IR IMAGE GUIDED FLUID DRAIN BY CATHETER  03/30/2021   IR RADIOLOGIST EVAL & MGMT  11/19/2020   IR RADIOLOGIST EVAL & MGMT  12/02/2020   IR RADIOLOGIST EVAL & MGMT  12/09/2020   IR SINUS/FIST TUBE CHK-NON GI  06/05/2021   IR SINUS/FIST TUBE CHK-NON GI  06/05/2021   IR SINUS/FIST TUBE CHK-NON GI  06/23/2021   IR SINUS/FIST TUBE CHK-NON GI  08/18/2021    Family History  Problem Relation Age of Onset   Breast cancer Sister        sister #1 age 76   Kidney cancer Sister        sister #1 age -46's    Breast cancer Sister        sister #2 age 36's   Kidney disease Sister        sister #2 age 10's   Diabetes Mother    Heart disease Mother        + BYPASS SURGERY   Deep vein thrombosis Mother    Arthritis Mother    Diabetes Sister    Heart disease Sister    Hypertension Brother    Heart attack Father     Social History:  reports that she has been smoking cigarettes. She has a 40.00 pack-year smoking history. She has been exposed to tobacco smoke. She has quit using smokeless tobacco.  Her smokeless tobacco use included snuff. She reports that she does not drink alcohol and does not use drugs.  Allergies: No Known Allergies  Medications reviewed.    ROS Full ROS performed and is otherwise negative other than what is stated in HPI   BP 127/82   Pulse 89   Temp 98.6 F (37 C) (Oral)   Ht '5\' 8"'$  (1.727 m)   Wt 244 lb 6.4 oz (110.9 kg)   SpO2 96%   BMI 37.16 kg/m   Physical Exam  Vitals and nursing note  reviewed. Exam conducted with a chaperone present.  Constitutional:      General: She is not in acute distress.    Appearance: Normal appearance. She is not ill-appearing.  Pulmonary:     Effort: Pulmonary effort is normal. No respiratory distress.     Breath sounds: No stridor.  Abdominal:     General: Abdomen is flat. There is no distension.     Palpations: There is no mass.     Tenderness: There is no abdominal tenderness. There is no guarding.     Hernia: No hernia is present.  Musculoskeletal:        General: Normal range of motion.  Skin:    General: Skin is warm and dry.     Capillary Refill: Capillary refill takes less than 2 seconds.  Neurological:     General: No focal deficit present.     Mental Status: She is alert and oriented to person, place, and time.  Psychiatric:        Mood and Affect: Mood normal.        Behavior: Behavior normal.        Thought Content: Thought content normal.        Judgment: Judgment  normal    Assessment/Plan: Completely resolved contained anastomotic leak after low anterior resection.  No evidence of further complications at this time.  Encouraged her to quit smoking and optimize her weight.  Testing or surgical intervention at this time. We will see her on as needed basis Please note that I spent 20 minutes in this encounter including coordination of her care, counseling the patient and performing appropriate documentation.  Caroleen Hamman, MD [facility] General Surgeon

## 2021-11-18 NOTE — Patient Instructions (Signed)
   Follow-up with our office as needed.  Please call and ask to speak with a nurse if you develop questions or concerns.  

## 2022-03-05 ENCOUNTER — Other Ambulatory Visit: Payer: Self-pay | Admitting: Cardiovascular Disease

## 2022-04-07 ENCOUNTER — Encounter: Payer: Self-pay | Admitting: Internal Medicine

## 2022-05-06 ENCOUNTER — Other Ambulatory Visit: Payer: Self-pay | Admitting: Cardiovascular Disease

## 2022-06-10 ENCOUNTER — Encounter: Payer: Self-pay | Admitting: Cardiovascular Disease

## 2022-06-10 ENCOUNTER — Ambulatory Visit: Payer: Medicare HMO | Admitting: Cardiovascular Disease

## 2022-06-10 VITALS — BP 114/78 | HR 80 | Ht 68.0 in | Wt 244.0 lb

## 2022-06-10 DIAGNOSIS — I10 Essential (primary) hypertension: Secondary | ICD-10-CM

## 2022-06-10 DIAGNOSIS — I480 Paroxysmal atrial fibrillation: Secondary | ICD-10-CM

## 2022-06-10 DIAGNOSIS — I5042 Chronic combined systolic (congestive) and diastolic (congestive) heart failure: Secondary | ICD-10-CM | POA: Diagnosis not present

## 2022-06-10 NOTE — Assessment & Plan Note (Signed)
07/2021 Echo normal EF 60%, grade I DD.Repeat echo prior to next appointment.

## 2022-06-10 NOTE — Assessment & Plan Note (Signed)
In sinus rhythm on exam, continue amiodarone and Eliquis.

## 2022-06-10 NOTE — Progress Notes (Signed)
Cardiology Office Note   Date:  06/10/2022   ID:  Stacy Cox, DOB 1955/03/07, MRN [id number]  PCP:  Emogene Morgan, MD  Cardiologist:  Adrian Blackwater, MD      History of Present Illness: Stacy Cox is a 67 y.o. female who presents for  Chief Complaint  Patient presents with   Follow-up    4 month follow up    Patient in office for routine cardiac exam. Denies chest pain, shortness of breath, edema, palpitations. Patient reports occasional shortness of breath on exertion.     Past Medical History:  Diagnosis Date   CHF (congestive heart failure) (HCC)    COPD (chronic obstructive pulmonary disease) (HCC)    Diabetes mellitus, type 2 (HCC)    GERD (gastroesophageal reflux disease)    Heart disease    Hypertension    Joint pain    Sleep apnea    Tobacco abuse      Past Surgical History:  Procedure Laterality Date   COLONOSCOPY  2017   COLONOSCOPY WITH PROPOFOL N/A 01/07/2021   Procedure: COLONOSCOPY WITH PROPOFOL;  Surgeon: Pasty Spillers, MD;  Location: [facility] ENDOSCOPY;  Service: Endoscopy;  Laterality: N/A;   IR CATHETER TUBE CHANGE  11/25/2020   IR CATHETER TUBE CHANGE  11/25/2020   IR CATHETER TUBE CHANGE  03/30/2021   IR CATHETER TUBE CHANGE  04/09/2021   IR CATHETER TUBE CHANGE  05/15/2021   IR CATHETER TUBE CHANGE  05/22/2021   IR CATHETER TUBE CHANGE  07/07/2021   IR IMAGE GUIDED FLUID DRAIN BY CATHETER  03/30/2021   IR RADIOLOGIST EVAL & MGMT  11/19/2020   IR RADIOLOGIST EVAL & MGMT  12/02/2020   IR RADIOLOGIST EVAL & MGMT  12/09/2020   IR SINUS/FIST TUBE CHK-NON GI  06/05/2021   IR SINUS/FIST TUBE CHK-NON GI  06/05/2021   IR SINUS/FIST TUBE CHK-NON GI  06/23/2021   IR SINUS/FIST TUBE CHK-NON GI  08/18/2021     Current Outpatient Medications  Medication Sig Dispense Refill   amiodarone (PACERONE) 200 MG tablet Take 1 tablet (200 mg total) by mouth daily. 30 tablet 0   Ascorbic Acid (VITAMIN C) 1000 MG tablet Take 1,000 mg by mouth daily.      carvedilol (COREG CR) 40 MG 24 hr capsule TAKE 1 CAPSULE TWICE DAILY 90 capsule 3   Cholecalciferol (VITAMIN D) 50 MCG (2000 UT) CAPS Take 2,000 Units by mouth daily.     diclofenac Sodium (VOLTAREN) 1 % GEL Apply 2 g topically daily as needed (joint pain).     ELIQUIS 5 MG TABS tablet TAKE 1 TABLET TWICE DAILY 180 tablet 3   ENTRESTO 24-26 MG TAKE 1 TABLET TWICE DAILY 180 tablet 3   FARXIGA 10 MG TABS tablet Take 10 mg by mouth daily.     furosemide (LASIX) 40 MG tablet TAKE 1 TABLET EVERY DAY 90 tablet 3   loperamide (IMODIUM) 2 MG capsule Take 1 capsule (2 mg total) by mouth 2 (two) times daily. 30 capsule 0   metFORMIN (GLUCOPHAGE) 500 MG tablet Take 500 mg by mouth daily with breakfast.     methocarbamol (ROBAXIN) 500 MG tablet Take 1 tablet (500 mg total) by mouth every 6 (six) hours as needed for muscle spasms. 60 tablet 1   Multiple Vitamins-Minerals (MULTIVITAL PO) Take 1 tablet by mouth daily.     pantoprazole (PROTONIX) 40 MG tablet Take 40 mg by mouth daily.     spironolactone (ALDACTONE) 25 MG tablet  Take 25 mg by mouth once.     vitamin B-12 (CYANOCOBALAMIN) 1000 MCG tablet Take 1,000 mcg by mouth daily.     No current facility-administered medications for this visit.    Allergies:   Patient has no known allergies.    Social History:   reports that she has been smoking cigarettes. She has a 40.00 pack-year smoking history. She has been exposed to tobacco smoke. She has quit using smokeless tobacco.  Her smokeless tobacco use included snuff. She reports that she does not drink alcohol and does not use drugs.   Family History:  family history includes Arthritis in her mother; Breast cancer in her sister and sister; Deep vein thrombosis in her mother; Diabetes in her mother and sister; Heart attack in her father; Heart disease in her mother and sister; Hypertension in her brother; Kidney cancer in her sister; Kidney disease in her sister.    ROS:     Review of Systems   Constitutional: Negative.   HENT: Negative.    Eyes: Negative.   Respiratory: Negative.    Cardiovascular: Negative.   Gastrointestinal: Negative.   Genitourinary: Negative.   Musculoskeletal: Negative.   Skin: Negative.   Neurological: Negative.   Endo/Heme/Allergies: Negative.   Psychiatric/Behavioral: Negative.    All other systems reviewed and are negative.   All other systems are reviewed and negative.   PHYSICAL EXAM: VS:  BP 114/78   Pulse 80   Ht 5\' 8"  (1.727 m)   Wt 244 lb (110.7 kg)   SpO2 98%   BMI 37.10 kg/m  , BMI Body mass index is 37.1 kg/m. Last weight:  Wt Readings from Last 3 Encounters:  06/10/22 244 lb (110.7 kg)  11/18/21 244 lb 6.4 oz (110.9 kg)  08/24/21 236 lb (107 kg)    Physical Exam Constitutional:      Appearance: Normal appearance.  Cardiovascular:     Rate and Rhythm: Normal rate and regular rhythm.     Heart sounds: Normal heart sounds.  Pulmonary:     Effort: Pulmonary effort is normal.     Breath sounds: Normal breath sounds.  Musculoskeletal:     Right lower leg: No edema.     Left lower leg: No edema.  Neurological:     Mental Status: She is alert.     EKG: none today  Recent Labs: No results found for requested labs within last 365 days.    Lipid Panel    Component Value Date/Time   CHOL 198 12/12/2013 0000   CHOL 141 10/31/2011 0508   TRIG 125 12/12/2013 0000   TRIG 162 10/31/2011 0508   HDL 43 12/12/2013 0000   HDL 38 (L) 10/31/2011 0508   VLDL 32 10/31/2011 0508   LDLCALC 130 12/12/2013 0000   LDLCALC 71 10/31/2011 0508      Other studies Reviewed: Patient: [id number] - Stacy Cox DOB:  December 05, 1955  Date:  07/27/2021 11:30 Provider: Adrian Blackwater MD Encounter: ECHO   Page 2 REASON FOR VISIT  Visit for: Echocardiogram/I 42.0  Sex:   Female   wt=232    lbs.  BP=130/88  Height= 68   inches.    TESTS  Imaging: Echocardiogram:  An echocardiogram in (2-d) mode was performed and in Doppler  mode with color flow velocity mapping was performed. The aortic valve cusps are abnormal 1.4  cm, flow velocity 1.13   m/s, and systolic calculated mean flow gradient 3  mmHg. Mitral valve diastolic peak flow velocity E .  484    m/s and E/A ratio 0.5. Aortic root diameter 3.0   cm. The LVOT internal diameter 3.0  cm and flow velocity was abnormal .902  m/s. LV systolic dimension 2.59   cm, diastolic 3.81 cm, posterior wall thickness 1.33  cm, fractional shortening 32 %, and EF 60.8 %. IVS thickness 1.44   cm. LA dimension 4.7 cm. Mitral Valve has Trace Regurgitation. Tricuspid Valve has Trace Regurgitation.     ASSESSMENT  Technically adequate study.  Normal chamber sizes.  Normal left ventricular systolic function.  Mild left ventricular hypertrophy with GRADE 1 (relaxation abnormality) diastolic dysfunction.  Normal right ventricular systolic function.  Normal right ventricular diastolic function.  Normal left ventricular wall motion.  Normal right ventricular wall motion.  Trace tricuspid regurgitation.  Normal pulmonary artery pressure.  Trace mitral regurgitation.  No pericardial effusion.  Mild LVH.   THERAPY   Referring physician: Laurier Nancy  Sonographer: Adrian Blackwater.   Adrian Blackwater MD  Electronically signed by: Adrian Blackwater     Date: 07/27/2021 15:15  Patient: [id number] - Stacy Cox DOB:  March 14, 1955  Date:  12/03/2020 08:00 Provider: Adrian Blackwater MD Encounter: [facility] TEST   Page 2 TESTS                                                                                          [facility] [address] [place], [place] [address] [phone] STUDY:  Gated Stress / Rest Myocardial Perfusion Imaging Tomographic (SPECT) Including attenuation correction Wall Motion, Left Ventricular Ejection Fraction By Gated Technique.Treadmill Stress Test. SEX: Female  WEIGHT: 238 lbs  HEIGHT: 68 in    ARMS UP: YES/NO                                                                         REFERRING PHYSICIAN: Dr.Jaxtyn Linville Welton Flakes  INDICATION FOR STUDY: A-Fib                                                                                                                                                                                                                    TECHNIQUE:  Approximately 20 minutes following the intravenous administration of 10.2 mCi of Tc-13m Sestamibi after stress testing in a reclined supine position with arms above their head if able to do so, gated SPECT imaging of the heart was performed. After about a 2hr break, the patient was injected intravenously with 31.1 mCi of Tc-52m Sestamibi.  Approximately 45 minutes later in the same position as stress imaging SPECT rest imaging of the heart was performed.  STRESS BY:  Adrian Blackwater, MD PROTOCOL:  Smitty Cords                                                                                        MAX PRED HR: 158                     85%: 134               75%: 119                                                                                                                   RESTING BP: 118/64  RESTING HR: 101  PEAK BP: 128/74   PEAK HR: 137 (86%)  EXERCISE DURATION: 1:31                                             METS: 2.9     REASON FOR TEST TERMINATION: Fatigue. SOB.                                                                                                                                  SYMPTOMS: Fatigue. SOB.  DUKE TREADMILL SCORE:  2                                      RISK: Moderate                                                                                                                                                                                                             EKG RESULTS: NSR. 64/min. No significant ST changes at peak exercise, occasional PVC's.                                                            IMAGE QUALITY: Good  PERFUSION/WALL MOTION FINDINGS: EF = 53%. Dilated LV. Small mild reversible basal anterior, basal inferior, basal inferolateral, mid inferior, mid inferolateral, and apex (17) wall defects, normal wall motion.                                                                          IMPRESSION:  Equivocal stress test, dilated LV, borderline LV function.                                                                                                                                                                                                                                                                                         Adrian Blackwater, MD Stress Interpreting Physician / Nuclear Interpreting Physician            Adrian Blackwater MD  Electronically signed by: Adrian Blackwater     Date: 12/05/2020 10:00   ASSESSMENT AND PLAN:    ICD-10-CM   1. Essential hypertension  I10 PCV ECHOCARDIOGRAM COMPLETE    2. Chronic combined systolic and diastolic congestive heart failure (HCC)  I50.42 PCV ECHOCARDIOGRAM COMPLETE    3. AF (paroxysmal atrial fibrillation) (HCC)  I48.0        Problem List Items Addressed This Visit       Cardiovascular and Mediastinum   CHF (congestive heart failure) (HCC)    07/2021 Echo normal EF 60%, grade I DD.Repeat echo prior to next appointment.       Relevant Medications   spironolactone (ALDACTONE) 25 MG  tablet   Other Relevant Orders   PCV ECHOCARDIOGRAM COMPLETE   Essential hypertension - Primary    Well controlled today. Continue same medications.       Relevant Medications   spironolactone (ALDACTONE) 25 MG tablet   Other Relevant Orders   PCV ECHOCARDIOGRAM COMPLETE   AF (paroxysmal atrial fibrillation) (HCC)    In sinus  rhythm on exam, continue amiodarone and Eliquis.       Relevant Medications   spironolactone (ALDACTONE) 25 MG tablet     Disposition:   Return in about 4 months (around 10/11/2022) for with echo prior.    Total time spent: 30 minutes  Signed,  Adrian Blackwater, MD  06/10/2022 10:48 AM    [facility]

## 2022-06-10 NOTE — Assessment & Plan Note (Signed)
Well controlled today. Continue same medications.  

## 2022-06-23 ENCOUNTER — Other Ambulatory Visit: Payer: Self-pay | Admitting: Cardiovascular Disease

## 2022-07-29 ENCOUNTER — Other Ambulatory Visit: Payer: Self-pay | Admitting: Cardiovascular Disease

## 2022-08-11 ENCOUNTER — Other Ambulatory Visit: Payer: Self-pay | Admitting: Family Medicine

## 2022-08-11 DIAGNOSIS — Z1231 Encounter for screening mammogram for malignant neoplasm of breast: Secondary | ICD-10-CM

## 2022-08-19 IMAGING — XA IR CATHETER TUBE CHANGE
1 series · 5 of 5 positions shown · non-contrast
Comparison: none

INDICATION: Fistula

[Series 20: interv standard · 5 of 5 slices shown]
[im 1/5]
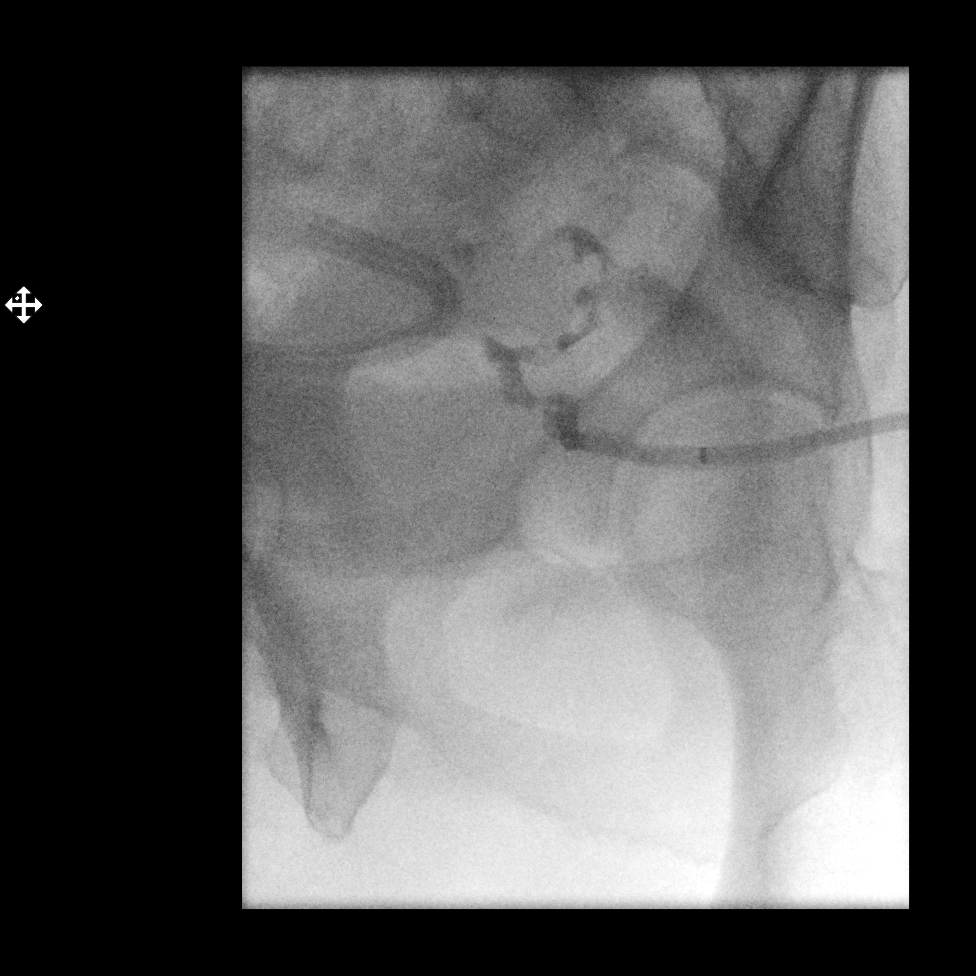
[im 2/5]
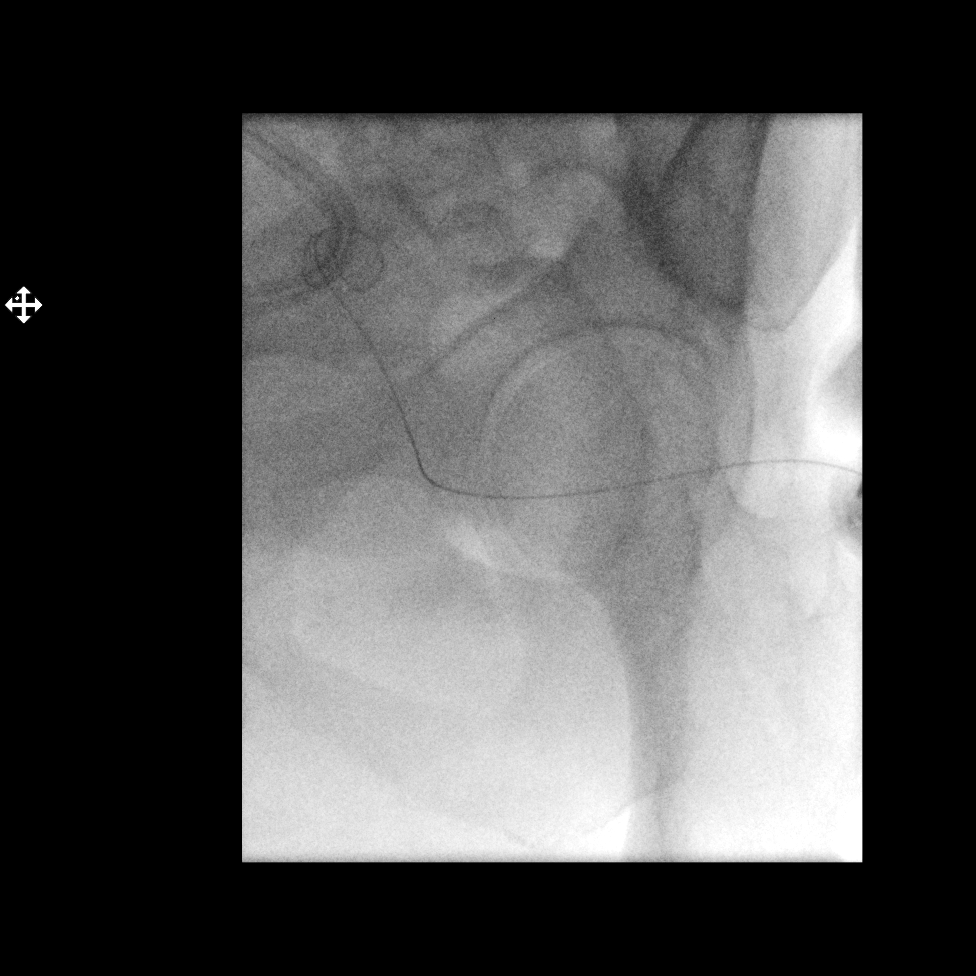
[im 3/5]
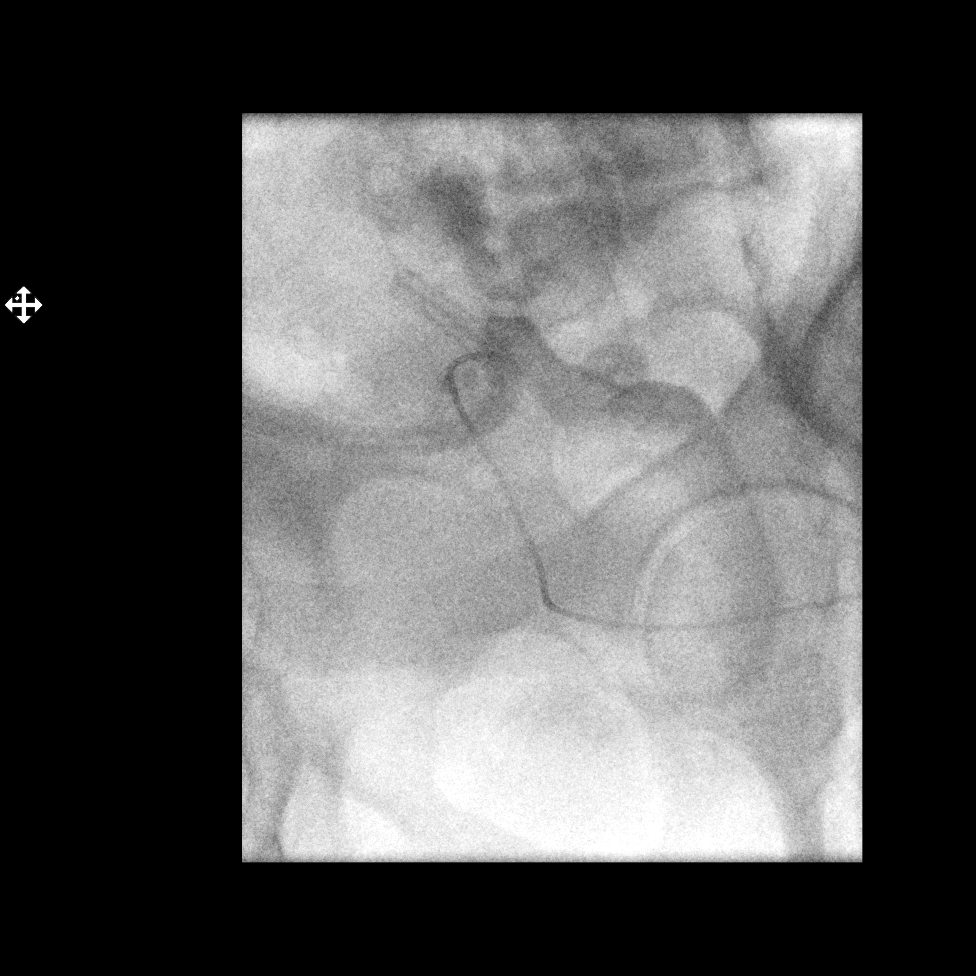
[im 4/5]
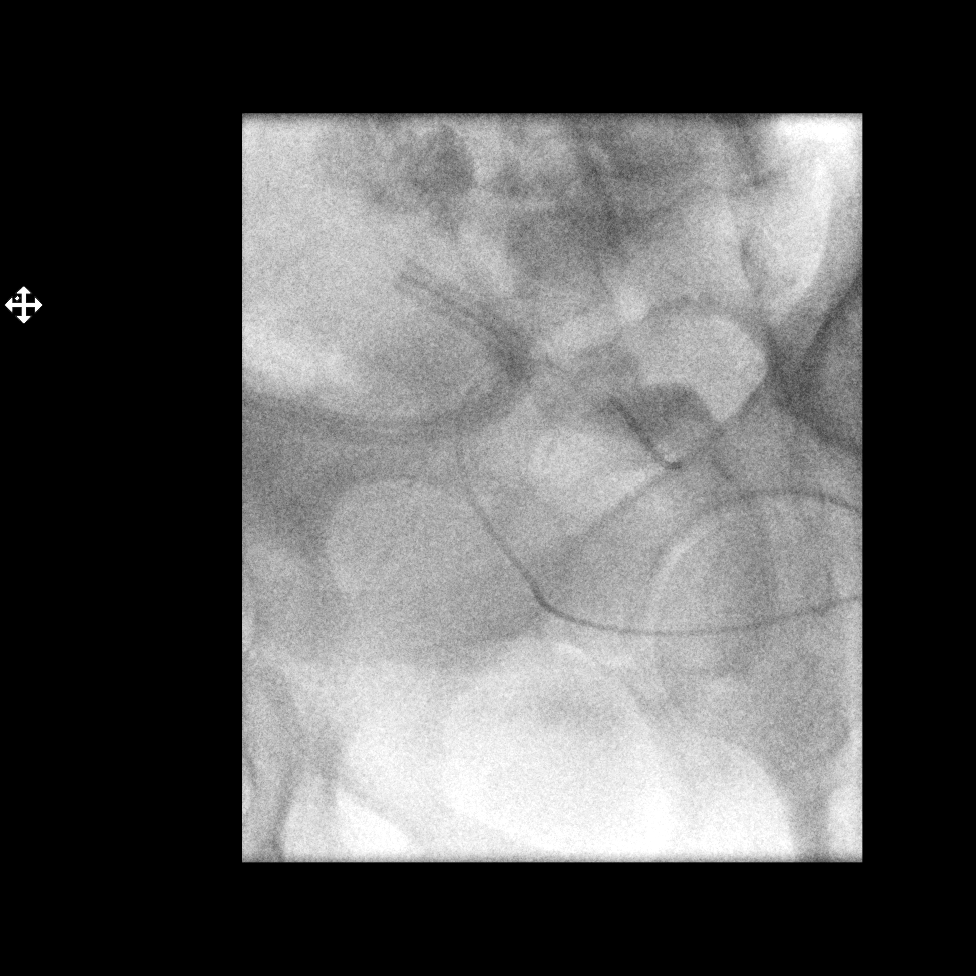
[im 5/5]
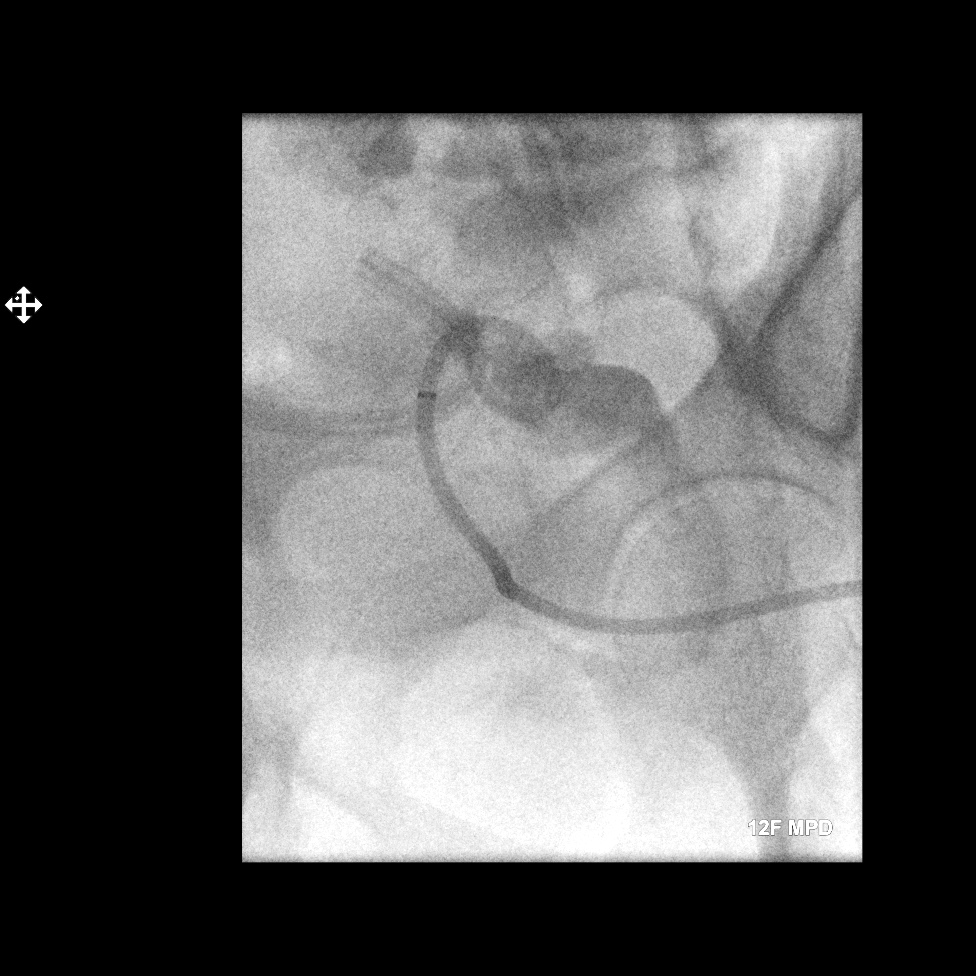

[5 of 5 positions shown; findings below may reference images not displayed]

EXAM:
Intra-abdominal exchange and reposition using fluoroscopic guidance

MEDICATIONS:
The patient is currently admitted to the hospital and receiving
intravenous antibiotics. The antibiotics were administered within an
appropriate time frame prior to the initiation of the procedure.

ANESTHESIA/SEDATION:
Local analgesia

FLUOROSCOPY TIME:  8.7 minutes (186 mGy)

COMPLICATIONS:
None immediate.

PROCEDURE:
Informed written consent was obtained from the patient after a
thorough discussion of the procedural risks, benefits and
alternatives. All questions were addressed. Maximal Sterile Barrier
Technique was utilized including caps, mask, sterile gowns, sterile
gloves, sterile drape, hand hygiene and skin antiseptic. A timeout
was performed prior to the initiation of the procedure.

The patient was placed in the lateral decubitus position on the exam
table. The lower back was prepped and draped in the standard sterile
fashion with inclusion of the existing drainage catheter within the
sterile field. Initial evaluation of the drainage catheter was
performed with injection of contrast material. This demonstrated a
decompressed cavity. The drainage catheter appears retracted. There
is a channel leading to a larger cavity identified. The locking loop
of the catheter was released. Initial attempts to remove the
catheter over an Amplatz wire were unsuccessful. Using a stiff
Glidewire, the existing drainage catheter was removed. A angled
catheter was advanced over the Glidewire, and using this catheter
and wire combination, access was obtained into the larger cavity.
Wire was exchanged for an Amplatz wire, and over this wire a new 12
French multipurpose locking drainage catheter was advanced into the
abscess cavity. Location was confirmed with injection of contrast
material. It was secured to skin using silk suture. A clean dressing
was placed. It was attached to bag drainage. No immediate
complication.
IMPRESSION: Successful exchange and reposition of the trans gluteal drainage
catheter for a new 12 French locking drainage catheter which was
advanced into the more anterior portion of the pelvic collection.

## 2022-08-25 ENCOUNTER — Ambulatory Visit
Admission: RE | Admit: 2022-08-25 | Discharge: 2022-08-25 | Disposition: A | Discharge status: Home / Self Care | Payer: Medicare HMO | Source: Ambulatory Visit | Attending: Family Medicine | Admitting: Family Medicine

## 2022-08-25 DIAGNOSIS — Z1231 Encounter for screening mammogram for malignant neoplasm of breast: Secondary | ICD-10-CM | POA: Diagnosis not present

## 2022-10-01 IMAGING — XA IR CATHETER TUBE CHANGE
1 series · 7 of 7 positions shown · non-contrast
Comparison: none

INDICATION: 65-year-old female with a history of diverticulitis status post
robotic assisted sigmoid colectomy complicated by anastomotic leak
and abscess formation the ses the taping placement of a percutaneous
drainage catheter. The drain has migrated through the defect in the
anastomosis and is now within the rectum. Patient presents after
recent drain exchange (04/09/2021) with a broken suture and new
onset pain concerning for partial displacement of the drain.

[Series 24: interv standard · 3 acquisitions, 7 frames shown]
[im 1/3]
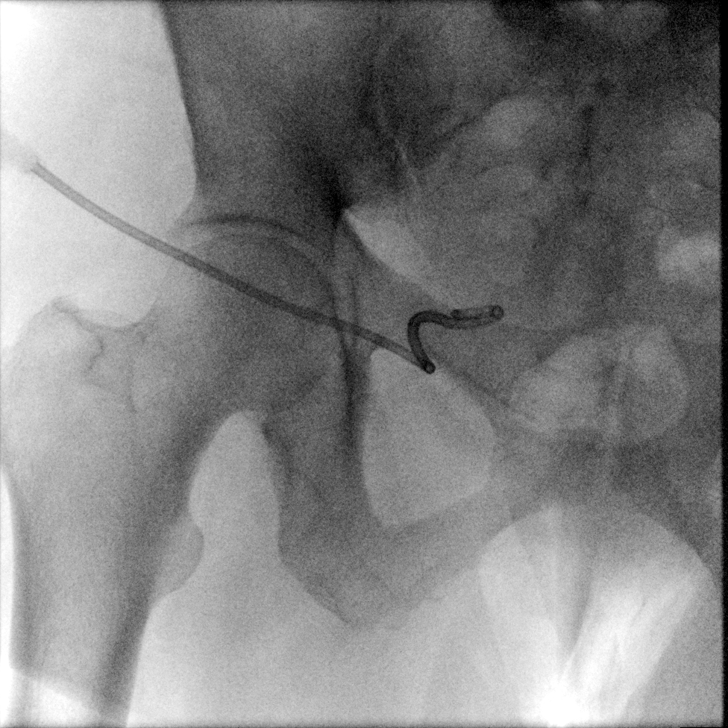
[im 2/3]
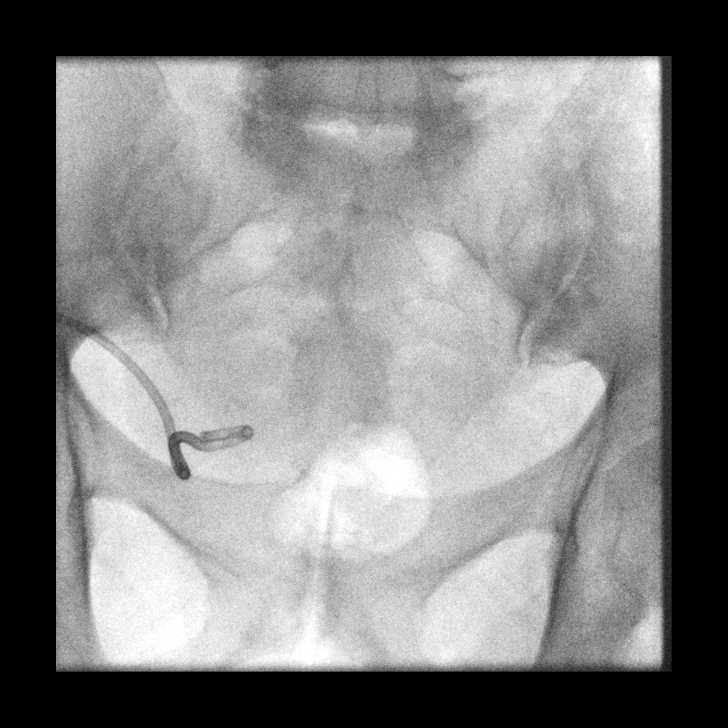
[im 2/3]
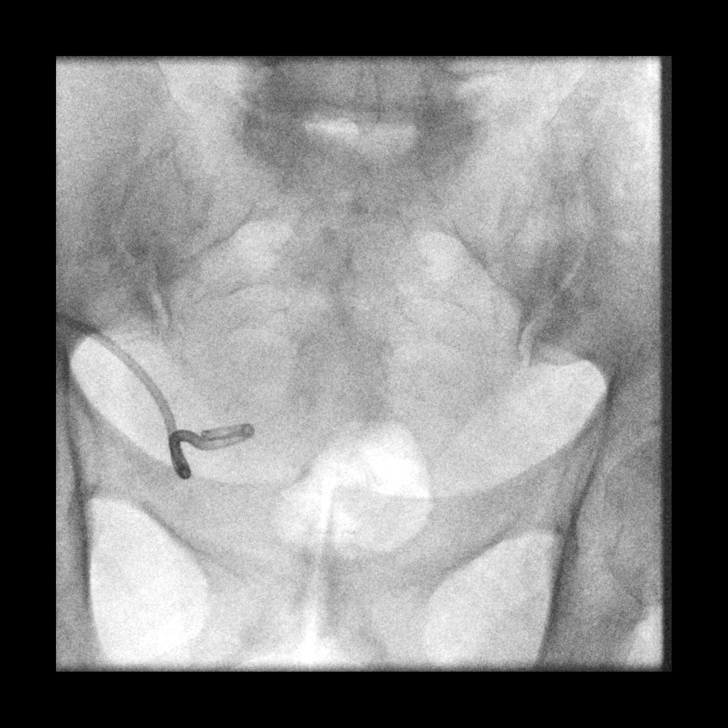
[im 2/3]
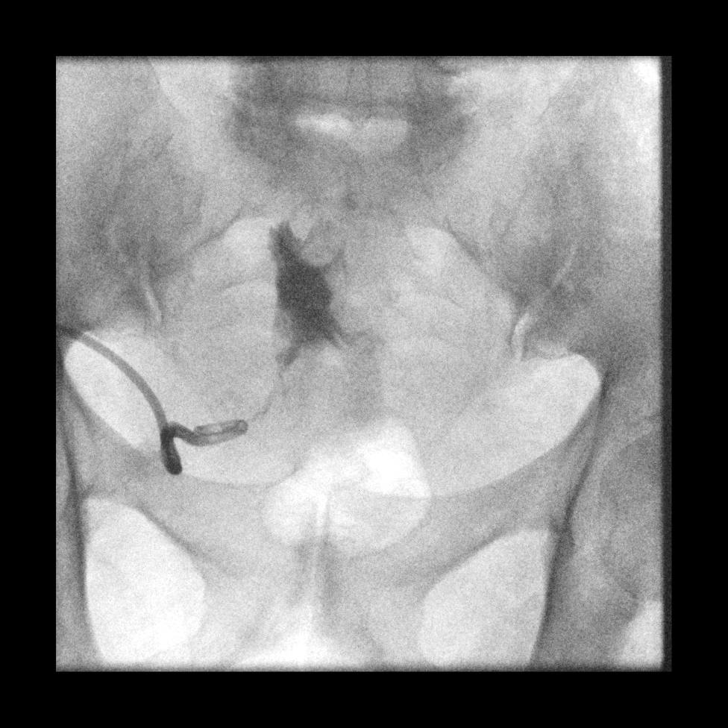
[im 2/3]
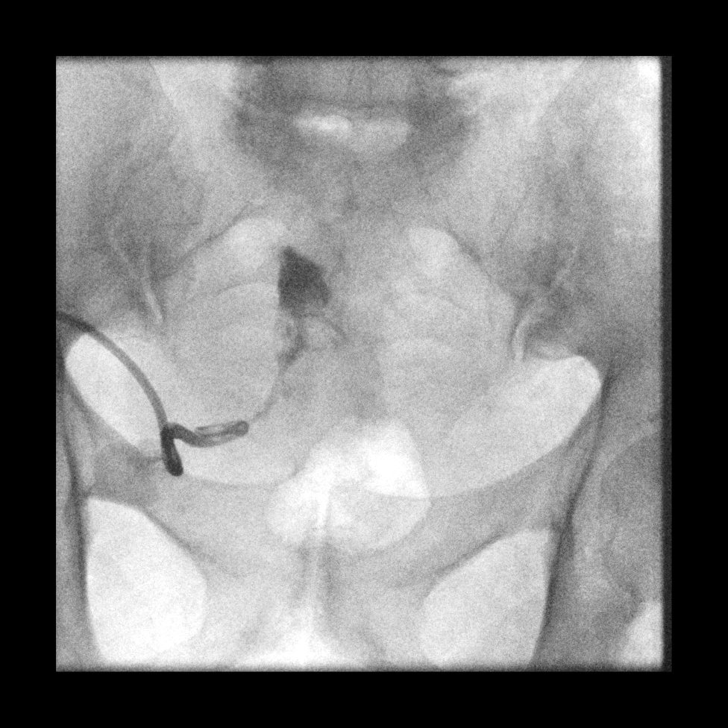
[im 3/3]
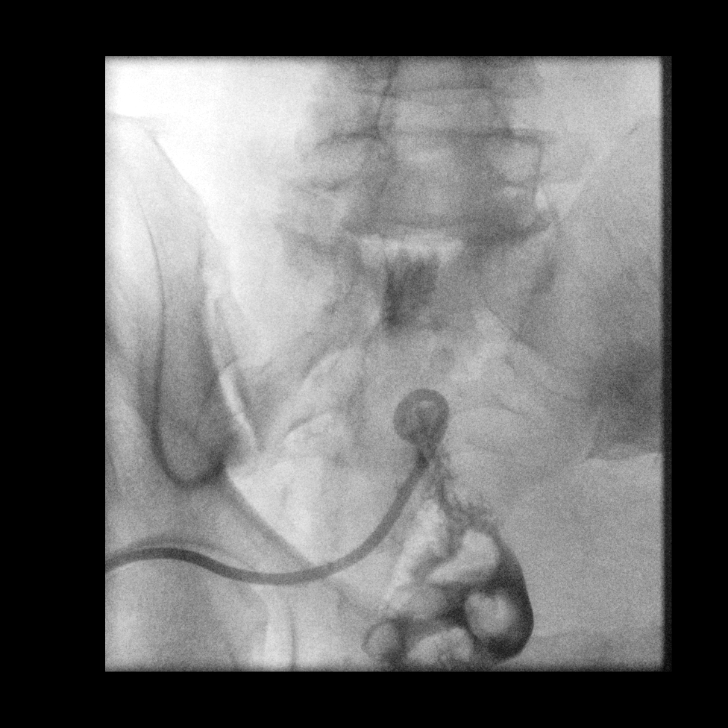
[im 3/3]
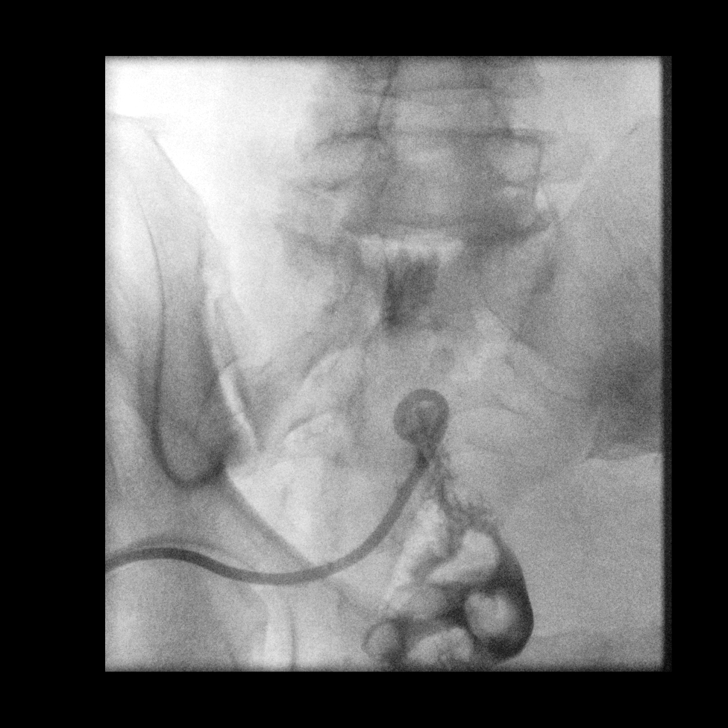

[7 of 7 positions shown; findings below may reference images not displayed]

EXAM:
Drain exchange

MEDICATIONS:
None.

ANESTHESIA/SEDATION:
None.

COMPLICATIONS:
None immediate.

PROCEDURE:
Informed written consent was obtained from the patient after a
thorough discussion of the procedural risks, benefits and
alternatives. All questions were addressed. Maximal Sterile Barrier
Technique was utilized including caps, mask, sterile gowns, sterile
gloves, sterile drape, hand hygiene and skin antiseptic. A timeout
was performed prior to the initiation of the procedure.

The existing catheter was imaged with fluoroscopy in felt to be
pulled back toward the skin entry site. An injection of contrast
material was performed. There is a persistent fistulous tract
extending from the drainage catheter to the rectum. The partially
displaced drainage catheter was transected and gently removed.
Utilizing an angled catheter and wire, the catheter was navigated
back through the tract and into the rectum. A new 10.2 Arto Berndtson
Baksh drainage catheter was then formed with the locking loop in
the rectum.

Local anesthesia was attained by infiltration with 1% lidocaine. The
catheter was secured in place with 0 silk suture.

Radiation exposure index: 26.3 mGy reference air kerma
IMPRESSION: 1. Partially displaced drainage catheters successfully rescued and
exchanged for a new 10.2 Dhalbhadur Purret drainage catheter.
The drainage catheter is positioned within the rectum.

PLAN:
Given persistent a defect in the wall of the rectum and drain
location within the rectum, the plan is to serially reduce catheter
size in an effort to minimize the size of the rectal defect.
Ultimately, when the defect is a small is possible and there is a
well-formed tract to the skin surface, we will consider pulling the
drainage catheter and see if the tract will heal without recurrent
abscess.

## 2022-10-05 ENCOUNTER — Ambulatory Visit (INDEPENDENT_AMBULATORY_CARE_PROVIDER_SITE_OTHER): Payer: Medicare HMO

## 2022-10-05 DIAGNOSIS — I10 Essential (primary) hypertension: Secondary | ICD-10-CM

## 2022-10-05 DIAGNOSIS — I351 Nonrheumatic aortic (valve) insufficiency: Secondary | ICD-10-CM

## 2022-10-05 DIAGNOSIS — I5042 Chronic combined systolic (congestive) and diastolic (congestive) heart failure: Secondary | ICD-10-CM

## 2022-10-12 ENCOUNTER — Encounter: Payer: Self-pay | Admitting: Cardiovascular Disease

## 2022-10-12 ENCOUNTER — Ambulatory Visit: Payer: Medicare HMO | Admitting: Cardiovascular Disease

## 2022-10-12 VITALS — BP 147/81 | HR 72 | Ht 68.0 in | Wt 248.6 lb

## 2022-10-12 DIAGNOSIS — I5022 Chronic systolic (congestive) heart failure: Secondary | ICD-10-CM

## 2022-10-12 DIAGNOSIS — I10 Essential (primary) hypertension: Secondary | ICD-10-CM | POA: Diagnosis not present

## 2022-10-12 DIAGNOSIS — I480 Paroxysmal atrial fibrillation: Secondary | ICD-10-CM

## 2022-10-12 DIAGNOSIS — F17210 Nicotine dependence, cigarettes, uncomplicated: Secondary | ICD-10-CM | POA: Diagnosis not present

## 2022-10-12 NOTE — Progress Notes (Signed)
Cardiology Office Note   Date:  10/12/2022   ID:  Stacy Cox, DOB [birth year], MRN [id number]  PCP:  Emogene Morgan, MD  Cardiologist:  Adrian Blackwater, MD      History of Present Illness: Stacy Cox is a 67 y.o. female who presents for  Chief Complaint  Patient presents with   Follow-up    Did not take antihypertensives      Past Medical History:  Diagnosis Date   CHF (congestive heart failure) (HCC)    COPD (chronic obstructive pulmonary disease) (HCC)    Diabetes mellitus, type 2 (HCC)    GERD (gastroesophageal reflux disease)    Heart disease    Hypertension    Joint pain    Sleep apnea    Tobacco abuse      Past Surgical History:  Procedure Laterality Date   COLONOSCOPY  2017   COLONOSCOPY WITH PROPOFOL N/A 01/07/2021   Procedure: COLONOSCOPY WITH PROPOFOL;  Surgeon: Pasty Spillers, MD;  Location: [facility] ENDOSCOPY;  Service: Endoscopy;  Laterality: N/A;   IR CATHETER TUBE CHANGE  11/25/2020   IR CATHETER TUBE CHANGE  11/25/2020   IR CATHETER TUBE CHANGE  03/30/2021   IR CATHETER TUBE CHANGE  04/09/2021   IR CATHETER TUBE CHANGE  05/15/2021   IR CATHETER TUBE CHANGE  05/22/2021   IR CATHETER TUBE CHANGE  07/07/2021   IR IMAGE GUIDED FLUID DRAIN BY CATHETER  03/30/2021   IR RADIOLOGIST EVAL & MGMT  11/19/2020   IR RADIOLOGIST EVAL & MGMT  12/02/2020   IR RADIOLOGIST EVAL & MGMT  12/09/2020   IR SINUS/FIST TUBE CHK-NON GI  06/05/2021   IR SINUS/FIST TUBE CHK-NON GI  06/05/2021   IR SINUS/FIST TUBE CHK-NON GI  06/23/2021   IR SINUS/FIST TUBE CHK-NON GI  08/18/2021     Current Outpatient Medications  Medication Sig Dispense Refill   ACCU-CHEK GUIDE test strip 1 each by Other route.     potassium chloride SA (KLOR-CON M) 20 MEQ tablet Take 20 mEq by mouth once.     amiodarone (PACERONE) 200 MG tablet TAKE 1 TABLET EVERY DAY 90 tablet 3   Ascorbic Acid (VITAMIN C) 1000 MG tablet Take 1,000 mg by mouth daily.     carvedilol (COREG CR) 40 MG 24 hr capsule  TAKE 1 CAPSULE TWICE DAILY 90 capsule 3   Cholecalciferol (VITAMIN D) 50 MCG (2000 UT) CAPS Take 2,000 Units by mouth daily.     diclofenac Sodium (VOLTAREN) 1 % GEL Apply 2 g topically daily as needed (joint pain).     ELIQUIS 5 MG TABS tablet TAKE 1 TABLET TWICE DAILY 180 tablet 3   ENTRESTO 24-26 MG TAKE 1 TABLET TWICE DAILY 180 tablet 3   FARXIGA 10 MG TABS tablet TAKE 1 TABLET ONE TIME DAILY 90 tablet 3   furosemide (LASIX) 40 MG tablet TAKE 1 TABLET EVERY DAY 90 tablet 3   loperamide (IMODIUM) 2 MG capsule Take 1 capsule (2 mg total) by mouth 2 (two) times daily. 30 capsule 0   metFORMIN (GLUCOPHAGE) 500 MG tablet Take 500 mg by mouth daily with breakfast.     methocarbamol (ROBAXIN) 500 MG tablet Take 1 tablet (500 mg total) by mouth every 6 (six) hours as needed for muscle spasms. 60 tablet 1   Multiple Vitamins-Minerals (MULTIVITAL PO) Take 1 tablet by mouth daily.     pantoprazole (PROTONIX) 40 MG tablet Take 40 mg by mouth daily.     spironolactone (ALDACTONE)  25 MG tablet Take 25 mg by mouth once.     vitamin B-12 (CYANOCOBALAMIN) 1000 MCG tablet Take 1,000 mcg by mouth daily.     No current facility-administered medications for this visit.    Allergies:   Patient has no known allergies.    Social History:   reports that she has been smoking cigarettes. She has a 40 pack-year smoking history. She has been exposed to tobacco smoke. She has quit using smokeless tobacco.  Her smokeless tobacco use included snuff. She reports that she does not drink alcohol and does not use drugs.   Family History:  family history includes Arthritis in her mother; Breast cancer in her sister and sister; Deep vein thrombosis in her mother; Diabetes in her mother and sister; Heart attack in her father; Heart disease in her mother and sister; Hypertension in her brother; Kidney cancer in her sister; Kidney disease in her sister.    ROS:     Review of Systems  Constitutional: Negative.   HENT:  Negative.    Eyes: Negative.   Respiratory: Negative.    Gastrointestinal: Negative.   Genitourinary: Negative.   Musculoskeletal: Negative.   Skin: Negative.   Neurological: Negative.   Endo/Heme/Allergies: Negative.   Psychiatric/Behavioral: Negative.    All other systems reviewed and are negative.     All other systems are reviewed and negative.    PHYSICAL EXAM: VS:  BP (!) 147/81   Pulse 72   Ht 5\' 8"  (1.727 m)   Wt 248 lb 9.6 oz (112.8 kg)   SpO2 97%   BMI 37.80 kg/m  , BMI Body mass index is 37.8 kg/m. Last weight:  Wt Readings from Last 3 Encounters:  10/12/22 248 lb 9.6 oz (112.8 kg)  06/10/22 244 lb (110.7 kg)  11/18/21 244 lb 6.4 oz (110.9 kg)     Physical Exam Constitutional:      Appearance: Normal appearance.  Cardiovascular:     Rate and Rhythm: Normal rate and regular rhythm.     Heart sounds: Normal heart sounds.  Pulmonary:     Effort: Pulmonary effort is normal.     Breath sounds: Normal breath sounds.  Musculoskeletal:     Right lower leg: No edema.     Left lower leg: No edema.  Neurological:     Mental Status: She is alert.       EKG:   Recent Labs: No results found for requested labs within last 365 days.    Lipid Panel    Component Value Date/Time   CHOL 198 12/12/2013 0000   CHOL 141 10/31/2011 0508   TRIG 125 12/12/2013 0000   TRIG 162 10/31/2011 0508   HDL 43 12/12/2013 0000   HDL 38 (L) 10/31/2011 0508   VLDL 32 10/31/2011 0508   LDLCALC 130 12/12/2013 0000   LDLCALC 71 10/31/2011 0508      Other studies Reviewed: Additional studies/ records that were reviewed today include:  Review of the above records demonstrates:       No data to display            ASSESSMENT AND PLAN:    ICD-10-CM   1. Essential hypertension  I10     2. Chronic systolic CHF (congestive heart failure) (HCC)  I50.22    LVEF 56 %, continue coreg/entresto    3. AF (paroxysmal atrial fibrillation) (HCC)  I48.0     4. Cigarette  smoker  F17.210        Problem  List Items Addressed This Visit       Cardiovascular and Mediastinum   Essential hypertension - Primary   AF (paroxysmal atrial fibrillation) (HCC)   Chronic systolic CHF (congestive heart failure) (HCC)     Other   Cigarette smoker       Disposition:   Return in about 2 months (around 12/12/2022).    Total time spent: 35 minutes  Signed,  Adrian Blackwater, MD  10/12/2022 10:10 AM    [facility]

## 2022-10-15 IMAGING — XA IR FISTULA/SINUS TRACT
1 series · 6 of 6 positions shown · non-contrast
Comparison: None Available.

MEDICATIONS:
None

ANESTHESIA/SEDATION:
None

COMPLICATIONS:
None immediate.

INDICATION: 65-year-old woman with history diverticulitis status post robotic
assisted sigmoid colectomy complicated by anastomotic leak and
abscess formation status post placement of percutaneous drainage
catheter. Drain pigtail on most recent fistulagram on 05/15/2021 was
located within the lumen of the sigmoid colon. Patient reports small
amounts of stool and fluid drainage from the drain.

EXAM:
SINUS TRACT INJECTION / FISTULOGRAM
TECHNIQUE: Informed written consent was obtained from the patient after a
thorough discussion of the procedural risks, benefits and
alternatives. All questions were addressed. Maximal Sterile Barrier
Technique was utilized including caps, mask, sterile gowns, sterile
gloves, sterile drape, hand hygiene and skin antiseptic. A timeout
was performed prior to the initiation of the procedure.
PROCEDURE:
Scout image shows the pelvic drain in unchanged position. Contrast
administered through the drain again shows intraluminal positioning
within distal colon. Drain flushed and reattached to bag.

[Series 7: interv standard · 3 acquisitions, 6 frames shown]
[im 1/3]
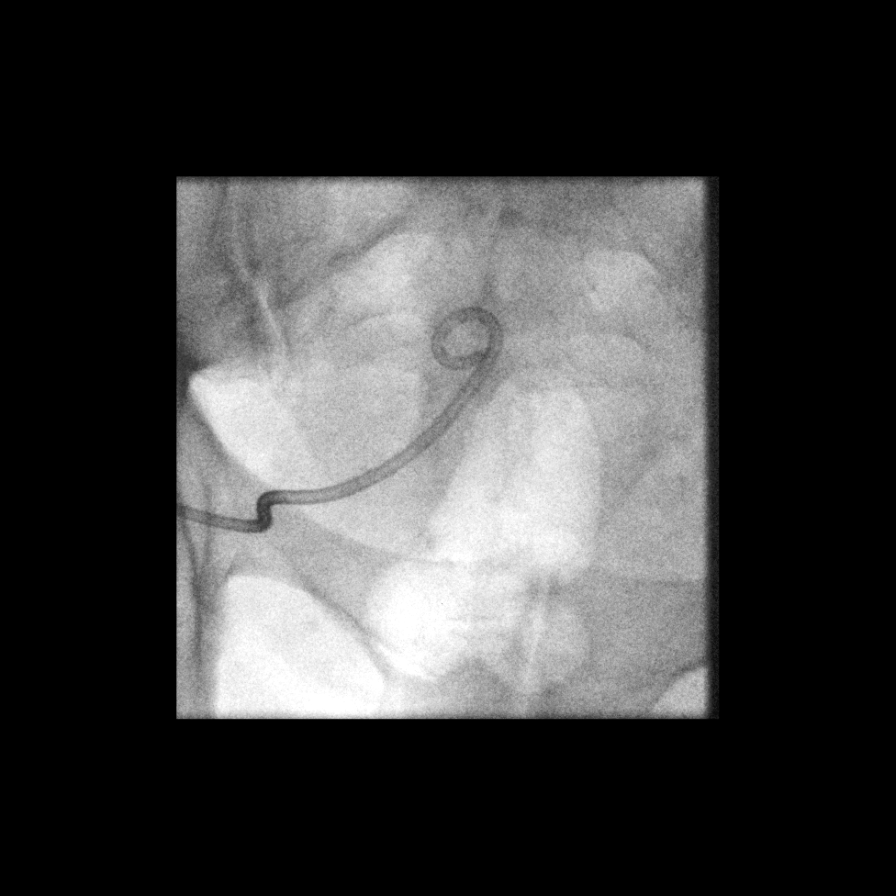
[im 2/3]
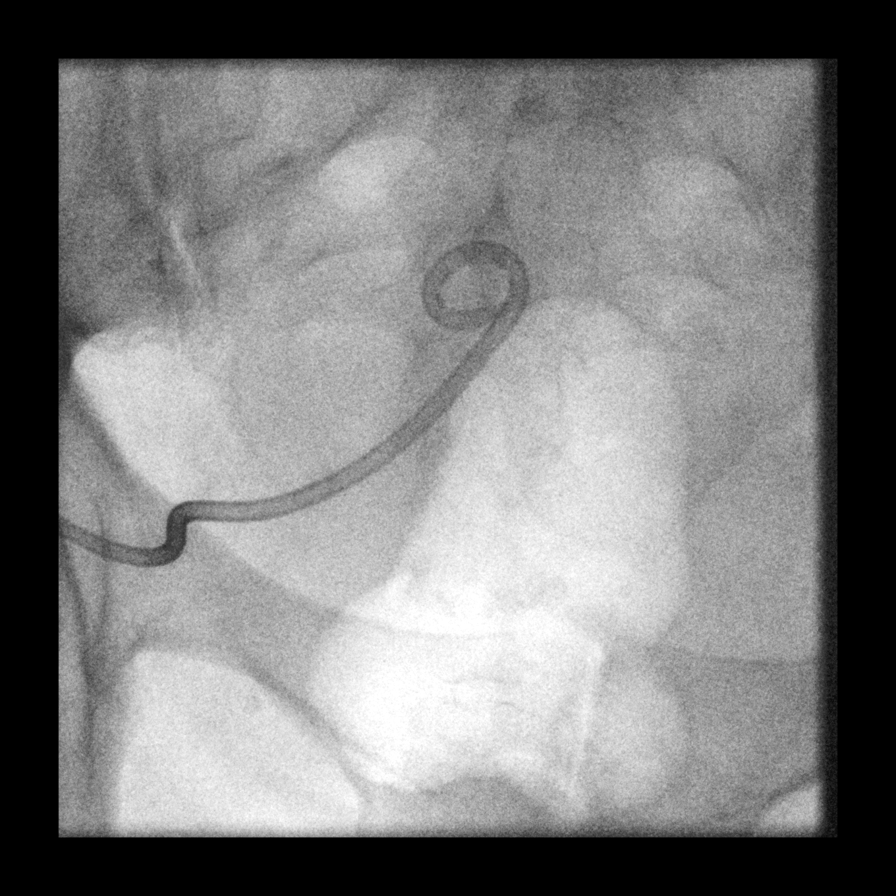
[im 2/3]
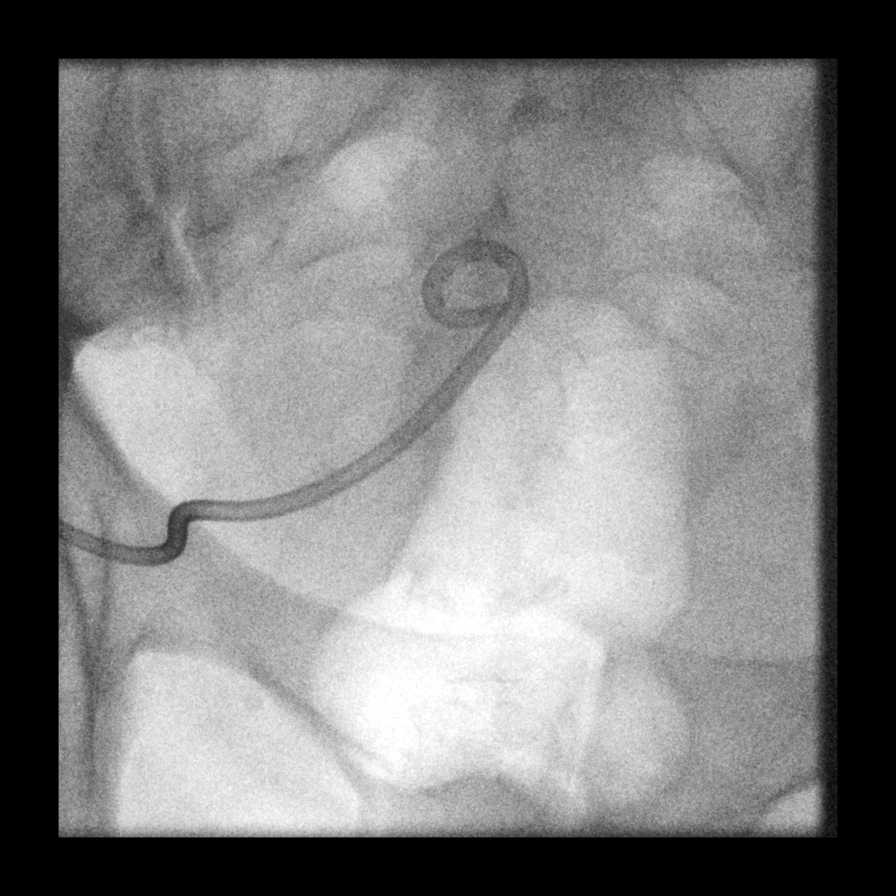
[im 2/3]
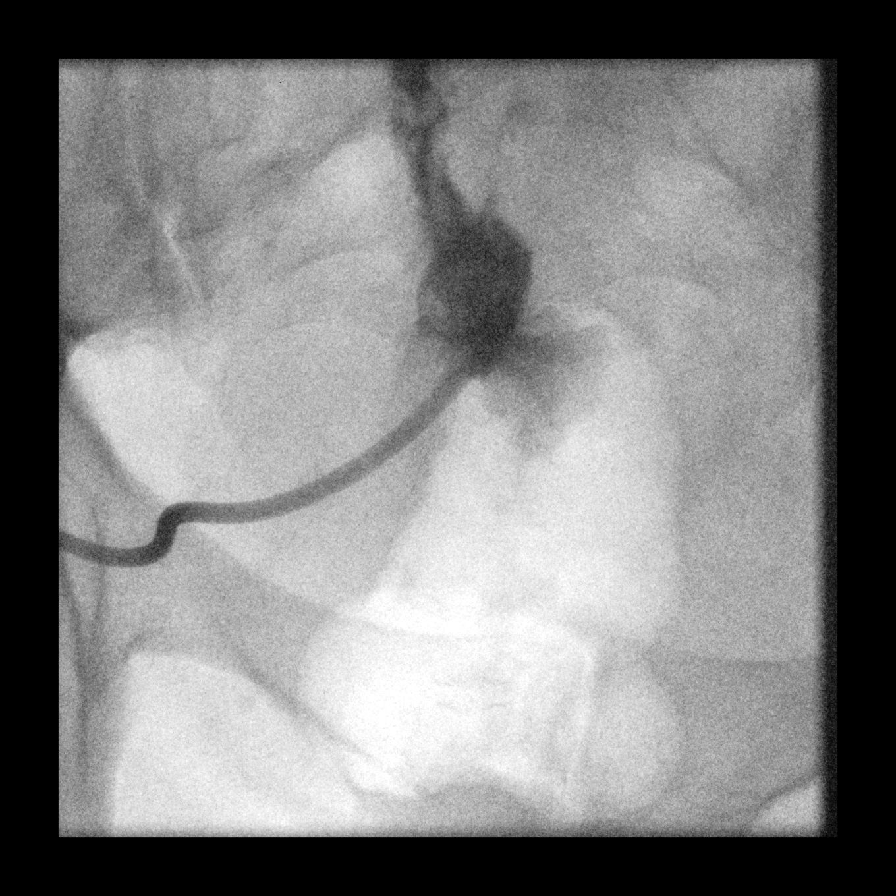
[im 2/3]
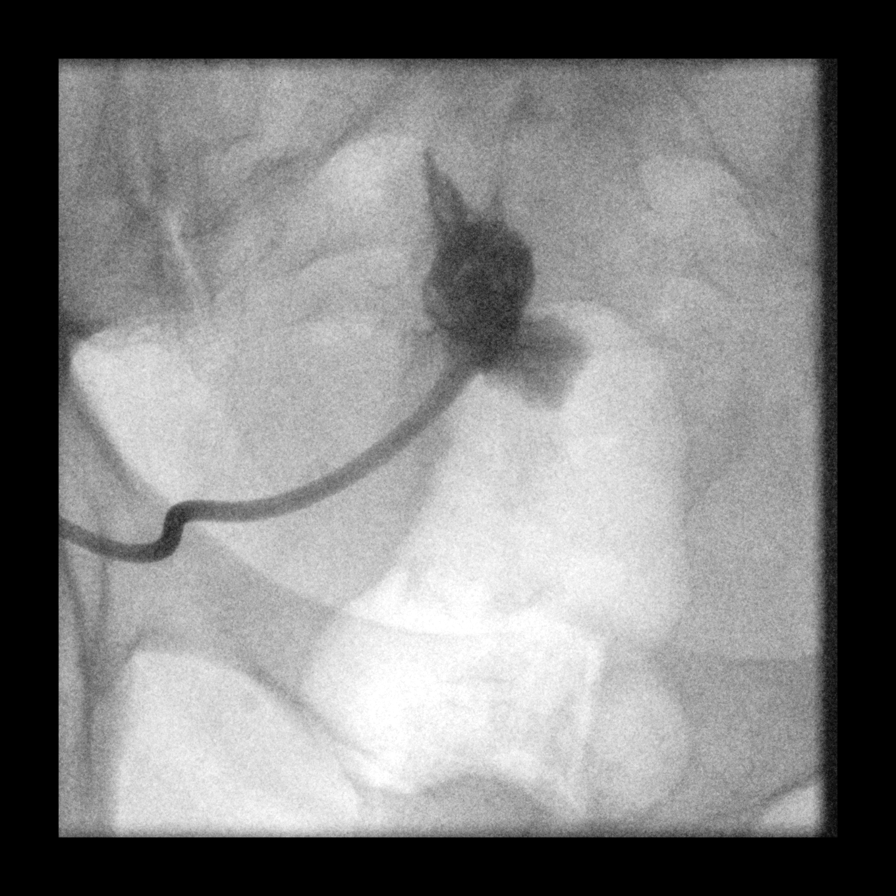
[im 3/3]
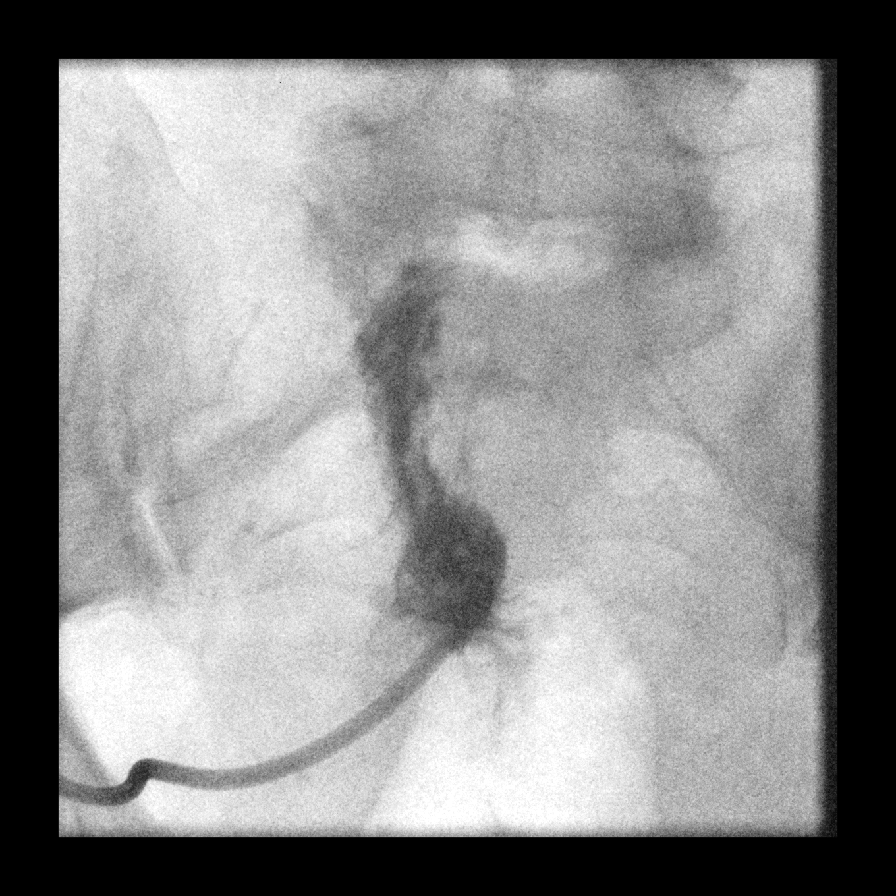

[6 of 6 positions shown; findings below may reference images not displayed]

IMPRESSION: Pelvic abscessogram again shows drain terminating within the distal
colon lumen. Drain flushed and reattached to bag.

PLAN:
The patient is scheduled for [REDACTED] visit at [facility] on [REDACTED] for consideration for primary closure of the colon wall defect.
I believe the drain should be left in place until that time.

She is very nervous about having the drain exchanged due to her
concerns with pain. Given that the drain is appropriately positioned
and functioning today, I do not think it is necessary to exchange at
this time.

I would like her to return in the first week [DATE] for a drain
exchange prior to her clinic appointment. She will contact us
earlier if she has any issues with the drain such as increasing
pain, leakage around the catheter, or catheter retraction.

## 2022-10-27 ENCOUNTER — Other Ambulatory Visit: Payer: Self-pay | Admitting: Cardiovascular Disease

## 2022-12-14 ENCOUNTER — Encounter: Payer: Self-pay | Admitting: Cardiovascular Disease

## 2022-12-14 ENCOUNTER — Ambulatory Visit: Payer: Medicare HMO | Admitting: Cardiovascular Disease

## 2022-12-14 VITALS — BP 118/68 | HR 85 | Ht 68.0 in | Wt 250.2 lb

## 2022-12-14 DIAGNOSIS — I5022 Chronic systolic (congestive) heart failure: Secondary | ICD-10-CM

## 2022-12-14 DIAGNOSIS — F17210 Nicotine dependence, cigarettes, uncomplicated: Secondary | ICD-10-CM

## 2022-12-14 DIAGNOSIS — I480 Paroxysmal atrial fibrillation: Secondary | ICD-10-CM

## 2022-12-14 DIAGNOSIS — I10 Essential (primary) hypertension: Secondary | ICD-10-CM

## 2022-12-14 NOTE — Progress Notes (Signed)
Cardiology Office Note   Date:  12/14/2022   ID:  Stacy Cox, DOB 16-Dec-1955, MRN [id number]  PCP:  Emogene Morgan, MD  Cardiologist:  Adrian Blackwater, MD      History of Present Illness: Stacy Cox is a 67 y.o. female who presents for  Chief Complaint  Patient presents with   Follow-up    2 month follow up    Doing well      Past Medical History:  Diagnosis Date   CHF (congestive heart failure) (HCC)    COPD (chronic obstructive pulmonary disease) (HCC)    Diabetes mellitus, type 2 (HCC)    GERD (gastroesophageal reflux disease)    Heart disease    Hypertension    Joint pain    Sleep apnea    Tobacco abuse      Past Surgical History:  Procedure Laterality Date   COLONOSCOPY  2017   COLONOSCOPY WITH PROPOFOL N/A 01/07/2021   Procedure: COLONOSCOPY WITH PROPOFOL;  Surgeon: Pasty Spillers, MD;  Location: [facility] ENDOSCOPY;  Service: Endoscopy;  Laterality: N/A;   IR CATHETER TUBE CHANGE  11/25/2020   IR CATHETER TUBE CHANGE  11/25/2020   IR CATHETER TUBE CHANGE  03/30/2021   IR CATHETER TUBE CHANGE  04/09/2021   IR CATHETER TUBE CHANGE  05/15/2021   IR CATHETER TUBE CHANGE  05/22/2021   IR CATHETER TUBE CHANGE  07/07/2021   IR IMAGE GUIDED FLUID DRAIN BY CATHETER  03/30/2021   IR RADIOLOGIST EVAL & MGMT  11/19/2020   IR RADIOLOGIST EVAL & MGMT  12/02/2020   IR RADIOLOGIST EVAL & MGMT  12/09/2020   IR SINUS/FIST TUBE CHK-NON GI  06/05/2021   IR SINUS/FIST TUBE CHK-NON GI  06/05/2021   IR SINUS/FIST TUBE CHK-NON GI  06/23/2021   IR SINUS/FIST TUBE CHK-NON GI  08/18/2021     Current Outpatient Medications  Medication Sig Dispense Refill   ACCU-CHEK GUIDE test strip 1 each by Other route.     amiodarone (PACERONE) 200 MG tablet TAKE 1 TABLET EVERY DAY 90 tablet 3   carvedilol (COREG CR) 40 MG 24 hr capsule TAKE 1 CAPSULE TWICE DAILY 180 capsule 3   Cholecalciferol (VITAMIN D) 50 MCG (2000 UT) CAPS Take 2,000 Units by mouth daily.     diclofenac Sodium  (VOLTAREN) 1 % GEL Apply 2 g topically daily as needed (joint pain).     ELIQUIS 5 MG TABS tablet TAKE 1 TABLET TWICE DAILY 180 tablet 3   ENTRESTO 24-26 MG TAKE 1 TABLET TWICE DAILY 180 tablet 3   FARXIGA 10 MG TABS tablet TAKE 1 TABLET ONE TIME DAILY 90 tablet 3   furosemide (LASIX) 40 MG tablet TAKE 1 TABLET EVERY DAY 90 tablet 3   loperamide (IMODIUM) 2 MG capsule Take 1 capsule (2 mg total) by mouth 2 (two) times daily. 30 capsule 0   metFORMIN (GLUCOPHAGE) 500 MG tablet Take 500 mg by mouth daily with breakfast.     methocarbamol (ROBAXIN) 500 MG tablet Take 1 tablet (500 mg total) by mouth every 6 (six) hours as needed for muscle spasms. 60 tablet 1   Multiple Vitamins-Minerals (MULTIVITAL PO) Take 1 tablet by mouth daily.     pantoprazole (PROTONIX) 40 MG tablet Take 40 mg by mouth daily.     potassium chloride SA (KLOR-CON M) 20 MEQ tablet Take 20 mEq by mouth once.     spironolactone (ALDACTONE) 25 MG tablet Take 25 mg by mouth once.  vitamin B-12 (CYANOCOBALAMIN) 1000 MCG tablet Take 1,000 mcg by mouth daily.     No current facility-administered medications for this visit.    Allergies:   Patient has no known allergies.    Social History:   reports that she has been smoking cigarettes. She has a 40 pack-year smoking history. She has been exposed to tobacco smoke. She has quit using smokeless tobacco.  Her smokeless tobacco use included snuff. She reports that she does not drink alcohol and does not use drugs.   Family History:  family history includes Arthritis in her mother; Breast cancer in her sister and sister; Deep vein thrombosis in her mother; Diabetes in her mother and sister; Heart attack in her father; Heart disease in her mother and sister; Hypertension in her brother; Kidney cancer in her sister; Kidney disease in her sister.    ROS:     Review of Systems  Constitutional: Negative.   HENT: Negative.    Eyes: Negative.   Respiratory: Negative.     Gastrointestinal: Negative.   Genitourinary: Negative.   Musculoskeletal: Negative.   Skin: Negative.   Neurological: Negative.   Endo/Heme/Allergies: Negative.   Psychiatric/Behavioral: Negative.    All other systems reviewed and are negative.     All other systems are reviewed and negative.    PHYSICAL EXAM: VS:  BP 118/68   Pulse 85   Ht 5\' 8"  (1.727 m)   Wt 250 lb 3.2 oz (113.5 kg)   SpO2 93%   BMI 38.04 kg/m  , BMI Body mass index is 38.04 kg/m. Last weight:  Wt Readings from Last 3 Encounters:  12/14/22 250 lb 3.2 oz (113.5 kg)  10/12/22 248 lb 9.6 oz (112.8 kg)  06/10/22 244 lb (110.7 kg)     Physical Exam Constitutional:      Appearance: Normal appearance.  Cardiovascular:     Rate and Rhythm: Normal rate and regular rhythm.     Heart sounds: Normal heart sounds.  Pulmonary:     Effort: Pulmonary effort is normal.     Breath sounds: Normal breath sounds.  Musculoskeletal:     Right lower leg: No edema.     Left lower leg: No edema.  Neurological:     Mental Status: She is alert.       EKG:   Recent Labs: No results found for requested labs within last 365 days.    Lipid Panel    Component Value Date/Time   CHOL 198 12/12/2013 0000   CHOL 141 10/31/2011 0508   TRIG 125 12/12/2013 0000   TRIG 162 10/31/2011 0508   HDL 43 12/12/2013 0000   HDL 38 (L) 10/31/2011 0508   VLDL 32 10/31/2011 0508   LDLCALC 130 12/12/2013 0000   LDLCALC 71 10/31/2011 0508      Other studies Reviewed: Additional studies/ records that were reviewed today include:  Review of the above records demonstrates:       No data to display            ASSESSMENT AND PLAN:    ICD-10-CM   1. Essential hypertension  I10    BP better, took meds prior to visit    2. Chronic systolic CHF (congestive heart failure) (HCC)  I50.22    LVEF 57% now, on entresto/coreg    3. AF (paroxysmal atrial fibrillation) (HCC)  I48.0     4. Cigarette smoker  F17.210         Problem List Items Addressed This Visit  Cardiovascular and Mediastinum   Essential hypertension - Primary   AF (paroxysmal atrial fibrillation) (HCC)   Chronic systolic CHF (congestive heart failure) (HCC)     Other   Cigarette smoker       Disposition:   No follow-ups on file.    Total time spent: 30 minutes  Signed,  Adrian Blackwater, MD  12/14/2022 11:28 AM    [facility]

## 2023-03-17 ENCOUNTER — Encounter: Payer: Self-pay | Admitting: Cardiovascular Disease

## 2023-03-17 ENCOUNTER — Ambulatory Visit: Payer: Medicare HMO | Admitting: Cardiovascular Disease

## 2023-03-17 VITALS — BP 114/62 | HR 105 | Ht 68.0 in | Wt 252.0 lb

## 2023-03-17 DIAGNOSIS — I480 Paroxysmal atrial fibrillation: Secondary | ICD-10-CM

## 2023-03-17 DIAGNOSIS — I5022 Chronic systolic (congestive) heart failure: Secondary | ICD-10-CM

## 2023-03-17 DIAGNOSIS — I5042 Chronic combined systolic (congestive) and diastolic (congestive) heart failure: Secondary | ICD-10-CM

## 2023-03-17 DIAGNOSIS — I10 Essential (primary) hypertension: Secondary | ICD-10-CM

## 2023-03-17 DIAGNOSIS — J111 Influenza due to unidentified influenza virus with other respiratory manifestations: Secondary | ICD-10-CM | POA: Diagnosis not present

## 2023-03-17 DIAGNOSIS — F17210 Nicotine dependence, cigarettes, uncomplicated: Secondary | ICD-10-CM

## 2023-03-17 LAB — POCT RAPID INFLUENZA A&B
FLU A: NEGATIVE
FLU B: NEGATIVE

## 2023-03-17 MED ORDER — CARVEDILOL PHOSPHATE ER 80 MG PO CP24: 80.0000 mg | ORAL_CAPSULE | Freq: Every day | ORAL | 2 refills | Status: AC

## 2023-03-17 NOTE — Progress Notes (Signed)
Cardiology Office Note   Date:  03/17/2023   ID:  Stacy Cox, DOB [birth year], MRN [id number]  PCP:  Emogene Morgan, MD  Cardiologist:  Adrian Blackwater, MD      History of Present Illness: Stacy Cox is a 68 y.o. female who presents for  Chief Complaint  Patient presents with   Follow-up    3 Months Follow Up    Says has a cold, runny nose, cough      Past Medical History:  Diagnosis Date   CHF (congestive heart failure) (HCC)    COPD (chronic obstructive pulmonary disease) (HCC)    Diabetes mellitus, type 2 (HCC)    GERD (gastroesophageal reflux disease)    Heart disease    Hypertension    Joint pain    Sleep apnea    Tobacco abuse      Past Surgical History:  Procedure Laterality Date   COLONOSCOPY  2017   COLONOSCOPY WITH PROPOFOL N/A 01/07/2021   Procedure: COLONOSCOPY WITH PROPOFOL;  Surgeon: Pasty Spillers, MD;  Location: [facility] ENDOSCOPY;  Service: Endoscopy;  Laterality: N/A;   IR CATHETER TUBE CHANGE  11/25/2020   IR CATHETER TUBE CHANGE  11/25/2020   IR CATHETER TUBE CHANGE  03/30/2021   IR CATHETER TUBE CHANGE  04/09/2021   IR CATHETER TUBE CHANGE  05/15/2021   IR CATHETER TUBE CHANGE  05/22/2021   IR CATHETER TUBE CHANGE  07/07/2021   IR IMAGE GUIDED FLUID DRAIN BY CATHETER  03/30/2021   IR RADIOLOGIST EVAL & MGMT  11/19/2020   IR RADIOLOGIST EVAL & MGMT  12/02/2020   IR RADIOLOGIST EVAL & MGMT  12/09/2020   IR SINUS/FIST TUBE CHK-NON GI  06/05/2021   IR SINUS/FIST TUBE CHK-NON GI  06/05/2021   IR SINUS/FIST TUBE CHK-NON GI  06/23/2021   IR SINUS/FIST TUBE CHK-NON GI  08/18/2021     Current Outpatient Medications  Medication Sig Dispense Refill   carvedilol (COREG CR) 80 MG 24 hr capsule Take 1 capsule (80 mg total) by mouth daily. 30 capsule 2   ACCU-CHEK GUIDE test strip 1 each by Other route.     amiodarone (PACERONE) 200 MG tablet TAKE 1 TABLET EVERY DAY 90 tablet 3   Cholecalciferol (VITAMIN D) 50 MCG (2000 UT) CAPS Take 2,000 Units by  mouth daily.     diclofenac Sodium (VOLTAREN) 1 % GEL Apply 2 g topically daily as needed (joint pain).     ELIQUIS 5 MG TABS tablet TAKE 1 TABLET TWICE DAILY 180 tablet 3   ENTRESTO 24-26 MG TAKE 1 TABLET TWICE DAILY 180 tablet 3   FARXIGA 10 MG TABS tablet TAKE 1 TABLET ONE TIME DAILY 90 tablet 3   furosemide (LASIX) 40 MG tablet TAKE 1 TABLET EVERY DAY 90 tablet 3   loperamide (IMODIUM) 2 MG capsule Take 1 capsule (2 mg total) by mouth 2 (two) times daily. 30 capsule 0   metFORMIN (GLUCOPHAGE) 500 MG tablet Take 500 mg by mouth daily with breakfast.     methocarbamol (ROBAXIN) 500 MG tablet Take 1 tablet (500 mg total) by mouth every 6 (six) hours as needed for muscle spasms. 60 tablet 1   Multiple Vitamins-Minerals (MULTIVITAL PO) Take 1 tablet by mouth daily.     pantoprazole (PROTONIX) 40 MG tablet Take 40 mg by mouth daily.     potassium chloride SA (KLOR-CON M) 20 MEQ tablet Take 20 mEq by mouth once.     spironolactone (ALDACTONE) 25 MG tablet  Take 25 mg by mouth once.     vitamin B-12 (CYANOCOBALAMIN) 1000 MCG tablet Take 1,000 mcg by mouth daily.     No current facility-administered medications for this visit.    Allergies:   Patient has no known allergies.    Social History:   reports that she has been smoking cigarettes. She has a 40 pack-year smoking history. She has been exposed to tobacco smoke. She has quit using smokeless tobacco.  Her smokeless tobacco use included snuff. She reports that she does not drink alcohol and does not use drugs.   Family History:  family history includes Arthritis in her mother; Breast cancer in her sister and sister; Deep vein thrombosis in her mother; Diabetes in her mother and sister; Heart attack in her father; Heart disease in her mother and sister; Hypertension in her brother; Kidney cancer in her sister; Kidney disease in her sister.    ROS:     Review of Systems  Constitutional: Negative.   HENT: Negative.    Eyes: Negative.    Respiratory: Negative.    Gastrointestinal: Negative.   Genitourinary: Negative.   Musculoskeletal: Negative.   Skin: Negative.   Neurological: Negative.   Endo/Heme/Allergies: Negative.   Psychiatric/Behavioral: Negative.    All other systems reviewed and are negative.     All other systems are reviewed and negative.    PHYSICAL EXAM: VS:  BP 114/62   Pulse (!) 105   Ht 5\' 8"  (1.727 m)   Wt 252 lb (114.3 kg)   SpO2 94%   BMI 38.32 kg/m  , BMI Body mass index is 38.32 kg/m. Last weight:  Wt Readings from Last 3 Encounters:  03/17/23 252 lb (114.3 kg)  12/14/22 250 lb 3.2 oz (113.5 kg)  10/12/22 248 lb 9.6 oz (112.8 kg)     Physical Exam Constitutional:      Appearance: Normal appearance.  Cardiovascular:     Rate and Rhythm: Normal rate and regular rhythm.     Heart sounds: Normal heart sounds.  Pulmonary:     Effort: Pulmonary effort is normal.     Breath sounds: Normal breath sounds.  Musculoskeletal:     Right lower leg: No edema.     Left lower leg: No edema.  Neurological:     Mental Status: She is alert.       EKG:   Recent Labs: No results found for requested labs within last 365 days.    Lipid Panel    Component Value Date/Time   CHOL 198 12/12/2013 0000   CHOL 141 10/31/2011 0508   TRIG 125 12/12/2013 0000   TRIG 162 10/31/2011 0508   HDL 43 12/12/2013 0000   HDL 38 (L) 10/31/2011 0508   VLDL 32 10/31/2011 0508   LDLCALC 130 12/12/2013 0000   LDLCALC 71 10/31/2011 0508      Other studies Reviewed: Additional studies/ records that were reviewed today include:  Review of the above records demonstrates:       No data to display            ASSESSMENT AND PLAN:    ICD-10-CM   1. Essential hypertension  I10     2. Chronic systolic CHF (congestive heart failure) (HCC)  I50.22    HR 105, increase coreg to 80 daily    3. AF (paroxysmal atrial fibrillation) (HCC)  I48.0     4. Chronic combined systolic and diastolic  congestive heart failure (HCC)  I50.42  5. Cigarette smoker  F17.210     6. URI due to influenza  J11.1 POCT Rapid Influenza A&B   check for flu, covid       Problem List Items Addressed This Visit       Cardiovascular and Mediastinum   CHF (congestive heart failure) (HCC)   Relevant Medications   carvedilol (COREG CR) 80 MG 24 hr capsule   Essential hypertension - Primary   Relevant Medications   carvedilol (COREG CR) 80 MG 24 hr capsule   AF (paroxysmal atrial fibrillation) (HCC)   Relevant Medications   carvedilol (COREG CR) 80 MG 24 hr capsule   Chronic systolic CHF (congestive heart failure) (HCC)   Relevant Medications   carvedilol (COREG CR) 80 MG 24 hr capsule     Other   Cigarette smoker   Other Visit Diagnoses       URI due to influenza       check for flu, covid   Relevant Orders   POCT Rapid Influenza A&B (Completed)          Disposition:   Return in about 2 months (around 05/15/2023).    Total time spent: 30 minutes  Signed,  Adrian Blackwater, MD  03/17/2023 11:39 AM    [facility]

## 2023-05-02 ENCOUNTER — Other Ambulatory Visit: Payer: Self-pay | Admitting: Cardiovascular Disease

## 2023-05-19 ENCOUNTER — Encounter: Payer: Self-pay | Admitting: Cardiovascular Disease

## 2023-05-19 ENCOUNTER — Ambulatory Visit: Payer: Medicare HMO | Admitting: Cardiovascular Disease

## 2023-05-19 VITALS — BP 140/70 | HR 86 | Ht 68.0 in | Wt 252.0 lb

## 2023-05-19 DIAGNOSIS — I5022 Chronic systolic (congestive) heart failure: Secondary | ICD-10-CM

## 2023-05-19 DIAGNOSIS — F17210 Nicotine dependence, cigarettes, uncomplicated: Secondary | ICD-10-CM

## 2023-05-19 DIAGNOSIS — I5042 Chronic combined systolic (congestive) and diastolic (congestive) heart failure: Secondary | ICD-10-CM | POA: Diagnosis not present

## 2023-05-19 DIAGNOSIS — I480 Paroxysmal atrial fibrillation: Secondary | ICD-10-CM | POA: Diagnosis not present

## 2023-05-19 DIAGNOSIS — I10 Essential (primary) hypertension: Secondary | ICD-10-CM | POA: Diagnosis not present

## 2023-05-19 NOTE — Progress Notes (Signed)
 Cardiology Office Note   Date:  05/19/2023   ID:  Stacy Cox, DOB 1955-09-13, MRN [id number]  PCP:  Stacy Morgan, MD  Cardiologist:  Adrian Blackwater, MD      History of Present Illness: Stacy Cox is a 68 y.o. female who presents for  Chief Complaint  Patient presents with   Follow-up    2 Months Follow Up    Gets SOB exertion      Past Medical History:  Diagnosis Date   CHF (congestive heart failure) (HCC)    COPD (chronic obstructive pulmonary disease) (HCC)    Diabetes mellitus, type 2 (HCC)    GERD (gastroesophageal reflux disease)    Heart disease    Hypertension    Joint pain    Sleep apnea    Tobacco abuse      Past Surgical History:  Procedure Laterality Date   COLONOSCOPY  2017   COLONOSCOPY WITH PROPOFOL N/A 01/07/2021   Procedure: COLONOSCOPY WITH PROPOFOL;  Surgeon: Stacy Spillers, MD;  Location: [facility] ENDOSCOPY;  Service: Endoscopy;  Laterality: N/A;   IR CATHETER TUBE CHANGE  11/25/2020   IR CATHETER TUBE CHANGE  11/25/2020   IR CATHETER TUBE CHANGE  03/30/2021   IR CATHETER TUBE CHANGE  04/09/2021   IR CATHETER TUBE CHANGE  05/15/2021   IR CATHETER TUBE CHANGE  05/22/2021   IR CATHETER TUBE CHANGE  07/07/2021   IR IMAGE GUIDED FLUID DRAIN BY CATHETER  03/30/2021   IR RADIOLOGIST EVAL & MGMT  11/19/2020   IR RADIOLOGIST EVAL & MGMT  12/02/2020   IR RADIOLOGIST EVAL & MGMT  12/09/2020   IR SINUS/FIST TUBE CHK-NON GI  06/05/2021   IR SINUS/FIST TUBE CHK-NON GI  06/05/2021   IR SINUS/FIST TUBE CHK-NON GI  06/23/2021   IR SINUS/FIST TUBE CHK-NON GI  08/18/2021     Current Outpatient Medications  Medication Sig Dispense Refill   ACCU-CHEK GUIDE test strip 1 each by Other route.     amiodarone (PACERONE) 200 MG tablet TAKE 1 TABLET EVERY DAY 90 tablet 3   carvedilol (COREG CR) 80 MG 24 hr capsule Take 1 capsule (80 mg total) by mouth daily. 30 capsule 2   Cholecalciferol (VITAMIN D) 50 MCG (2000 UT) CAPS Take 2,000 Units by mouth daily.      diclofenac Sodium (VOLTAREN) 1 % GEL Apply 2 g topically daily as needed (joint pain).     ELIQUIS 5 MG TABS tablet TAKE 1 TABLET TWICE DAILY 180 tablet 3   ENTRESTO 24-26 MG TAKE 1 TABLET TWICE DAILY 180 tablet 3   FARXIGA 10 MG TABS tablet TAKE 1 TABLET ONE TIME DAILY 90 tablet 3   furosemide (LASIX) 40 MG tablet TAKE 1 TABLET EVERY DAY 90 tablet 3   loperamide (IMODIUM) 2 MG capsule Take 1 capsule (2 mg total) by mouth 2 (two) times daily. 30 capsule 0   metFORMIN (GLUCOPHAGE) 500 MG tablet Take 500 mg by mouth daily with breakfast.     methocarbamol (ROBAXIN) 500 MG tablet Take 1 tablet (500 mg total) by mouth every 6 (six) hours as needed for muscle spasms. 60 tablet 1   Multiple Vitamins-Minerals (MULTIVITAL PO) Take 1 tablet by mouth daily.     pantoprazole (PROTONIX) 40 MG tablet Take 40 mg by mouth daily.     potassium chloride SA (KLOR-CON M) 20 MEQ tablet Take 20 mEq by mouth once.     spironolactone (ALDACTONE) 25 MG tablet Take 25 mg by  mouth once.     vitamin B-12 (CYANOCOBALAMIN) 1000 MCG tablet Take 1,000 mcg by mouth daily.     No current facility-administered medications for this visit.    Allergies:   Patient has no known allergies.    Social History:   reports that she has been smoking cigarettes. She has a 40 pack-year smoking history. She has been exposed to tobacco smoke. She has quit using smokeless tobacco.  Her smokeless tobacco use included snuff. She reports that she does not drink alcohol and does not use drugs.   Family History:  family history includes Arthritis in her mother; Breast cancer in her sister and sister; Deep vein thrombosis in her mother; Diabetes in her mother and sister; Heart attack in her father; Heart disease in her mother and sister; Hypertension in her brother; Kidney cancer in her sister; Kidney disease in her sister.    ROS:     Review of Systems  Constitutional: Negative.   HENT: Negative.    Eyes: Negative.   Respiratory: Negative.     Gastrointestinal: Negative.   Genitourinary: Negative.   Musculoskeletal: Negative.   Skin: Negative.   Neurological: Negative.   Endo/Heme/Allergies: Negative.   Psychiatric/Behavioral: Negative.    All other systems reviewed and are negative.     All other systems are reviewed and negative.    PHYSICAL EXAM: VS:  BP (!) 140/70   Pulse 86   Ht 5\' 8"  (1.727 m)   Wt 252 lb (114.3 kg)   SpO2 96%   BMI 38.32 kg/m  , BMI Body mass index is 38.32 kg/m. Last weight:  Wt Readings from Last 3 Encounters:  05/19/23 252 lb (114.3 kg)  03/17/23 252 lb (114.3 kg)  12/14/22 250 lb 3.2 oz (113.5 kg)     Physical Exam Constitutional:      Appearance: Normal appearance.  Cardiovascular:     Rate and Rhythm: Normal rate and regular rhythm.     Heart sounds: Normal heart sounds.  Pulmonary:     Effort: Pulmonary effort is normal.     Breath sounds: Normal breath sounds.  Musculoskeletal:     Right lower leg: No edema.     Left lower leg: No edema.  Neurological:     Mental Status: She is alert.       EKG:   Recent Labs: No results found for requested labs within last 365 days.    Lipid Panel    Component Value Date/Time   CHOL 198 12/12/2013 0000   CHOL 141 10/31/2011 0508   TRIG 125 12/12/2013 0000   TRIG 162 10/31/2011 0508   HDL 43 12/12/2013 0000   HDL 38 (L) 10/31/2011 0508   VLDL 32 10/31/2011 0508   LDLCALC 130 12/12/2013 0000   LDLCALC 71 10/31/2011 0508      Other studies Reviewed: Additional studies/ records that were reviewed today include:  Review of the above records demonstrates:       No data to display            ASSESSMENT AND PLAN:    ICD-10-CM   1. Essential hypertension  I10 Basic metabolic panel with GFR    2. Chronic systolic CHF (congestive heart failure) (HCC)  I50.22 Basic metabolic panel with GFR    3. AF (paroxysmal atrial fibrillation) (HCC)  I48.0 Basic metabolic panel with GFR    4. Chronic combined systolic  and diastolic congestive heart failure (HCC)  I50.42 Basic metabolic panel with GFR  compensated, Check labs, as on aldactone, KCL    5. Cigarette smoker  F17.210 Basic metabolic panel with GFR       Problem List Items Addressed This Visit       Cardiovascular and Mediastinum   CHF (congestive heart failure) (HCC)   Relevant Orders   Basic metabolic panel with GFR   Essential hypertension - Primary   Relevant Orders   Basic metabolic panel with GFR   AF (paroxysmal atrial fibrillation) (HCC)   Relevant Orders   Basic metabolic panel with GFR   Chronic systolic CHF (congestive heart failure) (HCC)   Relevant Orders   Basic metabolic panel with GFR     Other   Cigarette smoker   Relevant Orders   Basic metabolic panel with GFR       Disposition:   Return in about 2 months (around 07/19/2023).    Total time spent: 35 minutes  Signed,  Debborah Fairly, MD  05/19/2023 11:08 AM    [facility]

## 2023-05-20 LAB — BASIC METABOLIC PANEL WITH GFR
BUN/Creatinine Ratio: 11 — ABNORMAL LOW (ref 12–28)
BUN: 14 mg/dL (ref 8–27)
CO2: 18 mmol/L — ABNORMAL LOW (ref 20–29)
Calcium: 9.3 mg/dL (ref 8.7–10.3)
Chloride: 105 mmol/L (ref 96–106)
Creatinine, Ser: 1.3 mg/dL — ABNORMAL HIGH (ref 0.57–1.00)
Glucose: 204 mg/dL — ABNORMAL HIGH (ref 70–99)
Potassium: 3.5 mmol/L (ref 3.5–5.2)
Sodium: 142 mmol/L (ref 134–144)
eGFR: 46 mL/min/{1.73_m2} — ABNORMAL LOW (ref >59)

## 2023-06-22 ENCOUNTER — Other Ambulatory Visit: Payer: Self-pay | Admitting: Cardiovascular Disease

## 2023-07-19 ENCOUNTER — Ambulatory Visit: Admitting: Cardiovascular Disease

## 2023-07-20 ENCOUNTER — Ambulatory Visit: Admitting: Cardiology

## 2023-08-01 ENCOUNTER — Other Ambulatory Visit: Payer: Self-pay | Admitting: Cardiovascular Disease

## 2023-09-12 ENCOUNTER — Other Ambulatory Visit: Payer: Self-pay | Admitting: Family Medicine

## 2023-09-12 DIAGNOSIS — Z1231 Encounter for screening mammogram for malignant neoplasm of breast: Secondary | ICD-10-CM

## 2023-09-21 ENCOUNTER — Ambulatory Visit
Admission: RE | Admit: 2023-09-21 | Discharge: 2023-09-21 | Disposition: A | Discharge status: Home / Self Care | Source: Ambulatory Visit | Attending: Family Medicine | Admitting: Family Medicine

## 2023-09-21 DIAGNOSIS — Z1231 Encounter for screening mammogram for malignant neoplasm of breast: Secondary | ICD-10-CM | POA: Insufficient documentation

## 2023-10-06 ENCOUNTER — Other Ambulatory Visit: Payer: Self-pay | Admitting: Cardiovascular Disease
